# Patient Record
Sex: Female | Born: 1935 | Race: White | Hispanic: No | Smoking: Former smoker
Health system: Southern US, Community
[De-identification: ages and names within clinical notes are randomized; demographics above are authoritative.]

## PROBLEM LIST (undated history)

## (undated) DIAGNOSIS — I495 Sick sinus syndrome: Secondary | ICD-10-CM

## (undated) DIAGNOSIS — E559 Vitamin D deficiency, unspecified: Secondary | ICD-10-CM

## (undated) DIAGNOSIS — S22089A Unspecified fracture of T11-T12 vertebra, initial encounter for closed fracture: Secondary | ICD-10-CM

## (undated) DIAGNOSIS — Z8719 Personal history of other diseases of the digestive system: Secondary | ICD-10-CM

## (undated) DIAGNOSIS — N182 Chronic kidney disease, stage 2 (mild): Secondary | ICD-10-CM

## (undated) DIAGNOSIS — D500 Iron deficiency anemia secondary to blood loss (chronic): Secondary | ICD-10-CM

## (undated) DIAGNOSIS — I10 Essential (primary) hypertension: Secondary | ICD-10-CM

## (undated) DIAGNOSIS — M1990 Unspecified osteoarthritis, unspecified site: Secondary | ICD-10-CM

## (undated) DIAGNOSIS — I4891 Unspecified atrial fibrillation: Secondary | ICD-10-CM

## (undated) DIAGNOSIS — K5731 Diverticulosis of large intestine without perforation or abscess with bleeding: Principal | ICD-10-CM

## (undated) DIAGNOSIS — E039 Hypothyroidism, unspecified: Secondary | ICD-10-CM

## (undated) DIAGNOSIS — C44301 Unspecified malignant neoplasm of skin of nose: Secondary | ICD-10-CM

## (undated) DIAGNOSIS — E1129 Type 2 diabetes mellitus with other diabetic kidney complication: Secondary | ICD-10-CM

## (undated) DIAGNOSIS — E785 Hyperlipidemia, unspecified: Secondary | ICD-10-CM

## (undated) DIAGNOSIS — T7840XA Allergy, unspecified, initial encounter: Secondary | ICD-10-CM

## (undated) DIAGNOSIS — E782 Mixed hyperlipidemia: Secondary | ICD-10-CM

## (undated) DIAGNOSIS — I5189 Other ill-defined heart diseases: Secondary | ICD-10-CM

## (undated) DIAGNOSIS — E871 Hypo-osmolality and hyponatremia: Secondary | ICD-10-CM

## (undated) DIAGNOSIS — K219 Gastro-esophageal reflux disease without esophagitis: Secondary | ICD-10-CM

## (undated) HISTORY — DX: Unspecified osteoarthritis, unspecified site: M19.90

## (undated) HISTORY — DX: Sick sinus syndrome: I49.5

## (undated) HISTORY — PX: VAGINAL HYSTERECTOMY: SUR661

## (undated) HISTORY — DX: Personal history of other diseases of the digestive system: Z87.19

## (undated) HISTORY — PX: OTHER SURGICAL HISTORY: SHX169

## (undated) HISTORY — PX: NISSEN FUNDOPLICATION: SHX2091

## (undated) HISTORY — DX: Unspecified fracture of t11-T12 vertebra, initial encounter for closed fracture: S22.089A

## (undated) HISTORY — DX: Type 2 diabetes mellitus with other diabetic kidney complication: E11.29

## (undated) HISTORY — DX: Hypothyroidism, unspecified: E03.9

## (undated) HISTORY — PX: KYPHOSIS SURGERY: SHX114

## (undated) HISTORY — PX: JOINT REPLACEMENT: SHX530

## (undated) HISTORY — PX: TONSILLECTOMY: SUR1361

## (undated) HISTORY — DX: Hyperlipidemia, unspecified: E78.5

## (undated) HISTORY — DX: Chronic kidney disease, stage 2 (mild): N18.2

## (undated) HISTORY — DX: Mixed hyperlipidemia: E78.2

## (undated) HISTORY — DX: Unspecified atrial fibrillation: I48.91

## (undated) HISTORY — DX: Vitamin D deficiency, unspecified: E55.9

## (undated) HISTORY — DX: Iron deficiency anemia secondary to blood loss (chronic): D50.0

## (undated) HISTORY — PX: HIATAL HERNIA REPAIR: SHX195

## (undated) HISTORY — DX: Unspecified malignant neoplasm of skin of nose: C44.301

## (undated) HISTORY — DX: Gastro-esophageal reflux disease without esophagitis: K21.9

## (undated) HISTORY — PX: EYE SURGERY: SHX253

## (undated) HISTORY — DX: Allergy, unspecified, initial encounter: T78.40XA

## (undated) HISTORY — PX: CYSTOCELE REPAIR: SHX163

---

## 1999-04-01 ENCOUNTER — Encounter: Payer: Self-pay | Admitting: Emergency Medicine

## 1999-04-01 ENCOUNTER — Emergency Department (HOSPITAL_COMMUNITY): Admission: EM | Admit: 1999-04-01 | Discharge: 1999-04-01 | Payer: Self-pay | Admitting: Emergency Medicine

## 2000-10-15 ENCOUNTER — Encounter: Admission: RE | Admit: 2000-10-15 | Discharge: 2000-10-15 | Payer: Self-pay | Admitting: Family Medicine

## 2000-10-15 ENCOUNTER — Encounter: Payer: Self-pay | Admitting: Family Medicine

## 2001-03-06 ENCOUNTER — Ambulatory Visit (HOSPITAL_COMMUNITY): Admission: RE | Admit: 2001-03-06 | Discharge: 2001-03-06 | Payer: Self-pay | Admitting: Gastroenterology

## 2001-03-19 ENCOUNTER — Encounter: Admission: RE | Admit: 2001-03-19 | Discharge: 2001-03-19 | Payer: Self-pay | Admitting: Gastroenterology

## 2001-03-19 ENCOUNTER — Encounter: Payer: Self-pay | Admitting: Gastroenterology

## 2001-05-05 ENCOUNTER — Inpatient Hospital Stay (HOSPITAL_COMMUNITY): Admission: RE | Admit: 2001-05-05 | Discharge: 2001-05-08 | Payer: Self-pay | Admitting: Surgery

## 2001-05-06 ENCOUNTER — Encounter: Payer: Self-pay | Admitting: Surgery

## 2001-08-20 ENCOUNTER — Other Ambulatory Visit: Admission: RE | Admit: 2001-08-20 | Discharge: 2001-08-20 | Payer: Self-pay | Admitting: Obstetrics and Gynecology

## 2001-12-23 ENCOUNTER — Encounter: Admission: RE | Admit: 2001-12-23 | Discharge: 2001-12-23 | Payer: Self-pay | Admitting: Family Medicine

## 2001-12-23 ENCOUNTER — Encounter: Payer: Self-pay | Admitting: Family Medicine

## 2002-09-10 ENCOUNTER — Other Ambulatory Visit: Admission: RE | Admit: 2002-09-10 | Discharge: 2002-09-10 | Payer: Self-pay | Admitting: Obstetrics and Gynecology

## 2002-09-17 ENCOUNTER — Ambulatory Visit (HOSPITAL_COMMUNITY): Admission: RE | Admit: 2002-09-17 | Discharge: 2002-09-17 | Payer: Self-pay | Admitting: Obstetrics and Gynecology

## 2002-09-17 ENCOUNTER — Encounter: Payer: Self-pay | Admitting: Obstetrics and Gynecology

## 2002-12-10 ENCOUNTER — Ambulatory Visit (HOSPITAL_COMMUNITY): Admission: RE | Admit: 2002-12-10 | Discharge: 2002-12-10 | Payer: Self-pay | Admitting: Gastroenterology

## 2003-09-20 ENCOUNTER — Other Ambulatory Visit: Admission: RE | Admit: 2003-09-20 | Discharge: 2003-09-20 | Payer: Self-pay | Admitting: Obstetrics and Gynecology

## 2004-07-11 ENCOUNTER — Encounter: Admission: RE | Admit: 2004-07-11 | Discharge: 2004-07-11 | Payer: Self-pay | Admitting: Family Medicine

## 2004-08-18 ENCOUNTER — Ambulatory Visit (HOSPITAL_COMMUNITY): Admission: RE | Admit: 2004-08-18 | Discharge: 2004-08-18 | Payer: Self-pay | Admitting: Obstetrics and Gynecology

## 2004-10-04 ENCOUNTER — Encounter: Admission: RE | Admit: 2004-10-04 | Discharge: 2004-10-04 | Payer: Self-pay | Admitting: Obstetrics and Gynecology

## 2004-12-20 ENCOUNTER — Encounter: Admission: RE | Admit: 2004-12-20 | Discharge: 2004-12-20 | Payer: Self-pay | Admitting: Surgery

## 2005-02-26 ENCOUNTER — Ambulatory Visit (HOSPITAL_COMMUNITY): Admission: RE | Admit: 2005-02-26 | Discharge: 2005-02-26 | Payer: Self-pay | Admitting: Ophthalmology

## 2005-11-20 ENCOUNTER — Encounter (INDEPENDENT_AMBULATORY_CARE_PROVIDER_SITE_OTHER): Payer: Self-pay | Admitting: *Deleted

## 2005-11-20 ENCOUNTER — Ambulatory Visit (HOSPITAL_COMMUNITY): Admission: RE | Admit: 2005-11-20 | Discharge: 2005-11-20 | Payer: Self-pay | Admitting: Gastroenterology

## 2005-12-19 ENCOUNTER — Other Ambulatory Visit: Admission: RE | Admit: 2005-12-19 | Discharge: 2005-12-19 | Payer: Self-pay | Admitting: Obstetrics and Gynecology

## 2006-07-11 ENCOUNTER — Encounter (HOSPITAL_COMMUNITY): Admission: RE | Admit: 2006-07-11 | Discharge: 2006-10-09 | Payer: Self-pay | Admitting: Endocrinology

## 2006-10-11 ENCOUNTER — Encounter (HOSPITAL_COMMUNITY): Admission: RE | Admit: 2006-10-11 | Discharge: 2006-10-11 | Payer: Self-pay | Admitting: Endocrinology

## 2006-10-18 ENCOUNTER — Encounter: Admission: RE | Admit: 2006-10-18 | Discharge: 2006-10-18 | Payer: Self-pay | Admitting: Family Medicine

## 2006-11-27 ENCOUNTER — Other Ambulatory Visit: Admission: RE | Admit: 2006-11-27 | Discharge: 2006-11-27 | Payer: Self-pay | Admitting: Obstetrics and Gynecology

## 2007-12-22 ENCOUNTER — Inpatient Hospital Stay (HOSPITAL_COMMUNITY): Admission: EM | Admit: 2007-12-22 | Discharge: 2007-12-24 | Payer: Self-pay | Admitting: Emergency Medicine

## 2008-01-28 ENCOUNTER — Encounter (INDEPENDENT_AMBULATORY_CARE_PROVIDER_SITE_OTHER): Payer: Self-pay | Admitting: Interventional Cardiology

## 2008-01-28 ENCOUNTER — Ambulatory Visit (HOSPITAL_COMMUNITY): Admission: RE | Admit: 2008-01-28 | Discharge: 2008-01-28 | Payer: Self-pay | Admitting: Interventional Cardiology

## 2008-03-27 ENCOUNTER — Inpatient Hospital Stay (HOSPITAL_COMMUNITY): Admission: AD | Admit: 2008-03-27 | Discharge: 2008-03-30 | Payer: Self-pay | Admitting: Interventional Cardiology

## 2008-03-29 HISTORY — PX: PACEMAKER INSERTION: SHX728

## 2008-06-03 ENCOUNTER — Encounter: Admission: RE | Admit: 2008-06-03 | Discharge: 2008-06-03 | Payer: Self-pay | Admitting: Family Medicine

## 2008-11-12 DIAGNOSIS — S22089A Unspecified fracture of T11-T12 vertebra, initial encounter for closed fracture: Secondary | ICD-10-CM

## 2008-11-12 HISTORY — DX: Unspecified fracture of t11-T12 vertebra, initial encounter for closed fracture: S22.089A

## 2008-12-23 ENCOUNTER — Other Ambulatory Visit: Admission: RE | Admit: 2008-12-23 | Discharge: 2008-12-23 | Payer: Self-pay | Admitting: Obstetrics and Gynecology

## 2009-03-02 ENCOUNTER — Encounter: Admission: RE | Admit: 2009-03-02 | Discharge: 2009-03-02 | Payer: Self-pay | Admitting: Family Medicine

## 2009-05-05 ENCOUNTER — Inpatient Hospital Stay (HOSPITAL_COMMUNITY): Admission: EM | Admit: 2009-05-05 | Discharge: 2009-05-11 | Payer: Self-pay | Admitting: Emergency Medicine

## 2009-05-10 ENCOUNTER — Encounter (INDEPENDENT_AMBULATORY_CARE_PROVIDER_SITE_OTHER): Payer: Self-pay | Admitting: Interventional Radiology

## 2009-05-23 ENCOUNTER — Encounter: Payer: Self-pay | Admitting: Interventional Radiology

## 2009-06-08 ENCOUNTER — Encounter: Payer: Self-pay | Admitting: Family Medicine

## 2009-06-09 ENCOUNTER — Encounter: Admission: RE | Admit: 2009-06-09 | Discharge: 2009-06-09 | Payer: Self-pay | Admitting: Family Medicine

## 2009-06-21 ENCOUNTER — Ambulatory Visit (HOSPITAL_COMMUNITY): Admission: RE | Admit: 2009-06-21 | Discharge: 2009-06-21 | Payer: Self-pay | Admitting: Interventional Radiology

## 2009-07-05 ENCOUNTER — Encounter: Payer: Self-pay | Admitting: Interventional Radiology

## 2010-05-21 ENCOUNTER — Inpatient Hospital Stay (HOSPITAL_COMMUNITY): Admission: EM | Admit: 2010-05-21 | Discharge: 2010-05-24 | Payer: Self-pay | Admitting: Emergency Medicine

## 2010-09-12 ENCOUNTER — Encounter: Admission: RE | Admit: 2010-09-12 | Discharge: 2010-09-12 | Payer: Self-pay | Admitting: Orthopedic Surgery

## 2010-12-02 ENCOUNTER — Encounter: Payer: Self-pay | Admitting: Obstetrics and Gynecology

## 2010-12-04 ENCOUNTER — Encounter: Payer: Self-pay | Admitting: Internal Medicine

## 2011-01-28 LAB — POCT I-STAT, CHEM 8
Calcium, Ion: 1.16 mmol/L (ref 1.12–1.32)
Glucose, Bld: 102 mg/dL — ABNORMAL HIGH (ref 70–99)
HCT: 45 % (ref 36.0–46.0)
Hemoglobin: 15.3 g/dL — ABNORMAL HIGH (ref 12.0–15.0)
Potassium: 4.1 mEq/L (ref 3.5–5.1)

## 2011-01-28 LAB — PROTIME-INR
INR: 2.22 — ABNORMAL HIGH (ref 0.00–1.49)
Prothrombin Time: 14.5 seconds (ref 11.6–15.2)

## 2011-01-28 LAB — TYPE AND SCREEN
ABO/RH(D): B POS
Antibody Screen: NEGATIVE

## 2011-01-28 LAB — URINALYSIS, ROUTINE W REFLEX MICROSCOPIC
Bilirubin Urine: NEGATIVE
Ketones, ur: NEGATIVE mg/dL
Nitrite: NEGATIVE
Specific Gravity, Urine: 1.006 (ref 1.005–1.030)
Urobilinogen, UA: 0.2 mg/dL (ref 0.0–1.0)

## 2011-01-28 LAB — CBC
HCT: 40.6 % (ref 36.0–46.0)
Hemoglobin: 11.9 g/dL — ABNORMAL LOW (ref 12.0–15.0)
Hemoglobin: 12.2 g/dL (ref 12.0–15.0)
Hemoglobin: 13.9 g/dL (ref 12.0–15.0)
MCH: 31.1 pg (ref 26.0–34.0)
MCHC: 34.3 g/dL (ref 30.0–36.0)
MCV: 92.5 fL (ref 78.0–100.0)
MCV: 93 fL (ref 78.0–100.0)
MCV: 93.2 fL (ref 78.0–100.0)
Platelets: 202 10*3/uL (ref 150–400)
Platelets: 211 10*3/uL (ref 150–400)
RBC: 3.82 MIL/uL — ABNORMAL LOW (ref 3.87–5.11)
RBC: 4.37 MIL/uL (ref 3.87–5.11)
RDW: 13.1 % (ref 11.5–15.5)
RDW: 13.4 % (ref 11.5–15.5)
RDW: 13.8 % (ref 11.5–15.5)
WBC: 6.1 10*3/uL (ref 4.0–10.5)
WBC: 7.2 10*3/uL (ref 4.0–10.5)
WBC: 7.2 10*3/uL (ref 4.0–10.5)

## 2011-01-28 LAB — DIFFERENTIAL
Eosinophils Relative: 3 % (ref 0–5)
Lymphocytes Relative: 22 % (ref 12–46)
Lymphs Abs: 1.6 10*3/uL (ref 0.7–4.0)
Monocytes Absolute: 0.7 10*3/uL (ref 0.1–1.0)
Monocytes Relative: 9 % (ref 3–12)

## 2011-01-28 LAB — COMPREHENSIVE METABOLIC PANEL
ALT: 15 U/L (ref 0–35)
AST: 21 U/L (ref 0–37)
Albumin: 3.7 g/dL (ref 3.5–5.2)
Albumin: 3.9 g/dL (ref 3.5–5.2)
Alkaline Phosphatase: 50 U/L (ref 39–117)
BUN: 10 mg/dL (ref 6–23)
BUN: 9 mg/dL (ref 6–23)
Calcium: 8.5 mg/dL (ref 8.4–10.5)
Calcium: 8.9 mg/dL (ref 8.4–10.5)
Creatinine, Ser: 0.63 mg/dL (ref 0.4–1.2)
Creatinine, Ser: 0.66 mg/dL (ref 0.4–1.2)
GFR calc Af Amer: >60 mL/min (ref >60)
Potassium: 3.7 mEq/L (ref 3.5–5.1)
Sodium: 134 mEq/L — ABNORMAL LOW (ref 135–145)
Total Bilirubin: 0.6 mg/dL (ref 0.3–1.2)
Total Protein: 6.4 g/dL (ref 6.0–8.3)
Total Protein: 6.6 g/dL (ref 6.0–8.3)
Total Protein: 6.7 g/dL (ref 6.0–8.3)

## 2011-01-28 LAB — CARDIAC PANEL(CRET KIN+CKTOT+MB+TROPI)
CK, MB: 1.2 ng/mL (ref 0.3–4.0)
CK, MB: 1.4 ng/mL (ref 0.3–4.0)
Relative Index: INVALID (ref 0.0–2.5)
Relative Index: INVALID (ref 0.0–2.5)
Total CK: 43 U/L (ref 7–177)
Troponin I: <0.01 ng/mL (ref 0.00–0.06)
Troponin I: <0.01 ng/mL (ref 0.00–0.06)

## 2011-01-28 LAB — URINE CULTURE

## 2011-01-28 LAB — CK TOTAL AND CKMB (NOT AT ARMC)
CK, MB: 1.2 ng/mL (ref 0.3–4.0)
Relative Index: INVALID (ref 0.0–2.5)
Total CK: 54 U/L (ref 7–177)

## 2011-02-17 LAB — BASIC METABOLIC PANEL
CO2: 28 mEq/L (ref 19–32)
Calcium: 10 mg/dL (ref 8.4–10.5)
GFR calc Af Amer: >60 mL/min (ref >60)
GFR calc non Af Amer: >60 mL/min (ref >60)
Glucose, Bld: 136 mg/dL — ABNORMAL HIGH (ref 70–99)
Potassium: 3.7 mEq/L (ref 3.5–5.1)
Sodium: 135 mEq/L (ref 135–145)

## 2011-02-17 LAB — CBC
HCT: 41.4 % (ref 36.0–46.0)
Hemoglobin: 14 g/dL (ref 12.0–15.0)
RBC: 4.42 MIL/uL (ref 3.87–5.11)
RDW: 12.8 % (ref 11.5–15.5)

## 2011-02-17 LAB — APTT: aPTT: 30 seconds (ref 24–37)

## 2011-02-17 LAB — PROTIME-INR: INR: 1 (ref 0.00–1.49)

## 2011-02-19 LAB — BASIC METABOLIC PANEL
BUN: 9 mg/dL (ref 6–23)
CO2: 31 mEq/L (ref 19–32)
Chloride: 98 mEq/L (ref 96–112)
Chloride: 99 mEq/L (ref 96–112)
GFR calc non Af Amer: >60 mL/min (ref >60)
Glucose, Bld: 115 mg/dL — ABNORMAL HIGH (ref 70–99)
Potassium: 3.4 mEq/L — ABNORMAL LOW (ref 3.5–5.1)
Potassium: 3.7 mEq/L (ref 3.5–5.1)
Sodium: 134 mEq/L — ABNORMAL LOW (ref 135–145)
Sodium: 135 mEq/L (ref 135–145)

## 2011-02-19 LAB — CBC
HCT: 41.1 % (ref 36.0–46.0)
Hemoglobin: 13.2 g/dL (ref 12.0–15.0)
Hemoglobin: 13.9 g/dL (ref 12.0–15.0)
MCHC: 34.6 g/dL (ref 30.0–36.0)
MCV: 94 fL (ref 78.0–100.0)
MCV: 94 fL (ref 78.0–100.0)
Platelets: 338 10*3/uL (ref 150–400)
RBC: 4.04 MIL/uL (ref 3.87–5.11)
RBC: 4.38 MIL/uL (ref 3.87–5.11)
WBC: 8.8 10*3/uL (ref 4.0–10.5)

## 2011-02-19 LAB — URINALYSIS, ROUTINE W REFLEX MICROSCOPIC
Bilirubin Urine: NEGATIVE
Leukocytes, UA: NEGATIVE
Nitrite: NEGATIVE
Nitrite: POSITIVE — AB
Protein, ur: 100 mg/dL — AB
Specific Gravity, Urine: 1.007 (ref 1.005–1.030)
Specific Gravity, Urine: 1.03 (ref 1.005–1.030)
Urobilinogen, UA: 0.2 mg/dL (ref 0.0–1.0)
Urobilinogen, UA: 1 mg/dL (ref 0.0–1.0)
pH: 6.5 (ref 5.0–8.0)

## 2011-02-19 LAB — GLUCOSE, CAPILLARY
Glucose-Capillary: 133 mg/dL — ABNORMAL HIGH (ref 70–99)
Glucose-Capillary: 169 mg/dL — ABNORMAL HIGH (ref 70–99)

## 2011-02-19 LAB — URINE CULTURE

## 2011-02-19 LAB — DIFFERENTIAL
Eosinophils Absolute: 0 10*3/uL (ref 0.0–0.7)
Eosinophils Absolute: 0.4 10*3/uL (ref 0.0–0.7)
Eosinophils Relative: 4 % (ref 0–5)
Lymphocytes Relative: 27 % (ref 12–46)
Lymphs Abs: 1.1 10*3/uL (ref 0.7–4.0)
Lymphs Abs: 2.4 10*3/uL (ref 0.7–4.0)
Monocytes Absolute: 0.8 10*3/uL (ref 0.1–1.0)
Monocytes Relative: 9 % (ref 3–12)
Neutrophils Relative %: 78 % — ABNORMAL HIGH (ref 43–77)

## 2011-02-19 LAB — COMPREHENSIVE METABOLIC PANEL
ALT: 19 U/L (ref 0–35)
Alkaline Phosphatase: 64 U/L (ref 39–117)
BUN: 9 mg/dL (ref 6–23)
CO2: 29 mEq/L (ref 19–32)
CO2: 32 mEq/L (ref 19–32)
Calcium: 9.4 mg/dL (ref 8.4–10.5)
Chloride: 90 mEq/L — ABNORMAL LOW (ref 96–112)
Creatinine, Ser: 0.75 mg/dL (ref 0.4–1.2)
GFR calc non Af Amer: >60 mL/min (ref >60)
GFR calc non Af Amer: >60 mL/min (ref >60)
Glucose, Bld: 152 mg/dL — ABNORMAL HIGH (ref 70–99)
Glucose, Bld: 220 mg/dL — ABNORMAL HIGH (ref 70–99)
Potassium: 3.2 mEq/L — ABNORMAL LOW (ref 3.5–5.1)
Sodium: 136 mEq/L (ref 135–145)
Total Bilirubin: 0.7 mg/dL (ref 0.3–1.2)
Total Protein: 7.1 g/dL (ref 6.0–8.3)

## 2011-02-19 LAB — PROTIME-INR
INR: 1.1 (ref 0.00–1.49)
INR: 1.6 — ABNORMAL HIGH (ref 0.00–1.49)
INR: 2.3 — ABNORMAL HIGH (ref 0.00–1.49)
INR: 2.4 — ABNORMAL HIGH (ref 0.00–1.49)
Prothrombin Time: 14.2 seconds (ref 11.6–15.2)
Prothrombin Time: 19.9 seconds — ABNORMAL HIGH (ref 11.6–15.2)
Prothrombin Time: 27.6 seconds — ABNORMAL HIGH (ref 11.6–15.2)
Prothrombin Time: 31.2 seconds — ABNORMAL HIGH (ref 11.6–15.2)

## 2011-02-19 LAB — URINE MICROSCOPIC-ADD ON

## 2011-02-19 LAB — LIPASE, BLOOD: Lipase: 14 U/L (ref 11–59)

## 2011-03-27 NOTE — Discharge Summary (Signed)
NAMEBAILEIGH, [name]               ACCOUNT NO.:  [id number]   MEDICAL RECORD NO.:  [id number]          PATIENT TYPE:  INP   LOCATION:  2923                         FACILITY:  [facility]   PHYSICIAN:  Corky Crafts, MDDATE OF BIRTH:  23-Oct-1926   DATE OF ADMISSION:  03/27/2008  DATE OF DISCHARGE:  03/30/2008                               DISCHARGE SUMMARY   DISCHARGE DIAGNOSES:  1. Sick sinus syndrome status post St. Jude pacemaker.  2. History of atrial fibrillation.  3. Diastolic heart failure, resolved.  4. Hypertension.  5. Hyperlipidemia.  6. Seasonal allergies.  7. Allergy to CODEINE and SULFA.  8. Past surgical history includes partial hysterectomy, tonsillectomy,      and bladder surgery.  9. Family history of coronary artery disease.  10.Long-term medication use.  11.Long-term anticoagulation therapy.   [name] is a 77 year old female with a history of atrial  fibrillation who was recently admitted for RVR secondary to heart  failure.  She is wearing a LifeWatch monitor and the monitor noted a 4-  second pause which was consistent with sick sinus syndrome and she was  promptly admitted to [facility] and placed on telemetry.   She ultimately received a dual chamber St. [employer] pacemaker under  the care of Dr. Corliss Marcus.  Postoperatively, the patient did well.  Her chest x-ray was okay with no pneumothorax and the patient was  discharged to home the following day post procedure.   A activity and wound care instruction sheet was provided for the  patient.  The patient is to remain on a low-sodium heart-healthy diet.  The patient is to follow up with [facility] on Apr 02, 2008 at 12  p.m. and follow up with Dr. Eldridge Dace on April 13, 2008 at 10:45 a.m.   MEDICATIONS:  1. Cardizem CD 360 mg a day.  2. Toprol XL 50 mg a day.  3. Coumadin 5 mg a day.  4. Zocor 20 mg at bedtime.  5. Lasix 40 mg a day.  6. Micardis 80 mg a day.  7. Amiodarone 200 mg a  day.  8. Vicodin 5/325 one p.o. q.6 h. p.r.n. pain, #20, no refills.  9. Ambien 5 mg one p.o. at bedtime p.r.n. sleep, #10, no refills.   The patient was discharged to home in stable and improved condition.      Guy Franco, P.A.      Corky Crafts, MD  Electronically Signed    LB/MEDQ  D:  03/30/2008  T:  03/31/2008  Job:  [id number]

## 2011-03-27 NOTE — Consult Note (Signed)
[name], [name]               ACCOUNT NO.:  [id number]   MEDICAL RECORD NO.:  [id number]          PATIENT TYPE:  OUT   LOCATION:  XRAY                         FACILITY:  [facility]   PHYSICIAN:  Sanjeev K. Deveshwar, M.D.DATE OF BIRTH:  [birth year]   DATE OF CONSULTATION:  07/05/2009  DATE OF DISCHARGE:  07/05/2009                                 CONSULTATION   CHIEF COMPLAINT:  Status post vertebral augmentation at T11 performed on  June 21, 2009.   BRIEF HISTORY:  This is a pleasant 77 year old female who was admitted  to [facility] on May 05, 2009 by Dr. Crista Curb for  evaluation of severe back pain, which occurred after she fell in her  yard.  She was found to have a T11 compression fracture with a  paraspinous hematoma.  She underwent a kyphoplasty procedure on May 10, 2009, performed by Dr. Corliss Skains.  She was seen in followup on May 23, 2009.  At that time, she was still having pain.  The patient is unable  have an MRI due to a permanent pacemaker.  A bone scan was performed,  however, this did not show any new fracture and Dr. Corliss Skains offered to  add more cement at the T11 level to see if this would improve her  symptoms of pain.  The additional cement was added on June 21, 2009.  The patient returns today accompanied by her son to be seen in followup  approximately 2 weeks after her most recent intervention.   PAST MEDICAL HISTORY:  Significant for hypertension, hyperlipidemia,  paroxysmal atrial fibrillation with chronic Coumadin therapy.  She has a  Environmental education officer pacemaker for sick sinus syndrome.  She has a history  of congestive heart failure with diastolic dysfunction.   SURGICAL HISTORY:  Significant for tonsillectomy, hernia repair, partial  hysterectomy, and bladder surgery.  She denies any previous problems  with anesthesia.   ALLERGIES:  She is allergic to CODEINE and SULFA.   MEDICATIONS:  The patient states that her medications  have not changed.  She has been on Cardizem, Toprol, Coumadin, Zocor, Lasix, Micardis, and  amiodarone.  We had given the patient a prescription for Vicodin at her  last visit, #20 to be taken one tablet every 4-6 hours as needed for  pain.  The patient states that she has been taking the Vicodin twice  daily, but she has since run out.   SOCIAL HISTORY:  The patient's son lives with her.  She has been fairly  independent.  She does not smoke or use alcohol.  She is widowed.   IMPRESSION AND PLAN:  The patient returns today accompanied by her son.  She had undergone a kyphoplasty at the T11 level on May 10, 2009,  performed by Dr. Corliss Skains.  She had continued pain and underwent  additional vertebral augmentation on June 21, 2009 at the T11 level.  Of note, a biopsy from May 10, 2009 procedure was negative for  malignancy.   The patient reports that she is still having some pain.  She rates her  pain  now as a 6 on a 1 to 10 level prior to the most recent treatment.  Her pain was 8/10 on a 1 to 10 scale.  She states that her pain is worse  in the morning, it is also worse with activities.  She has continued to  limit her activity due to her discomfort.  She was unable to cook, do  dishes, or clean house at this time because of the ongoing pain.  She  has run out of her Vicodin.  She requested another prescription for  Vicodin.  Dr. Corliss Skains cautioned her that this medication is habit  forming, but she states she is only using it twice a day and feels that  she needs at least one more prescription to get her through.   We did give the patient a prescription for Vicodin 5/325 to be taken one  every 4-6 hours, dispensed #30 with no refills.  The patient's son  stated that she did not get any relief with Darvocet, Tylenol, or  nonsteroidal anti-inflammatory drugs.  She is back on her Coumadin at  this time.   Dr. Corliss Skains did review the images from the two procedures with the   patient and her son.  He felt that there was a very good result after  the most recent augmentation.  He felt that her pain should continue to  improve over the next couple of weeks.  She was told to contact us in 2  weeks to update [place] on her situation.  All of their questions were  answered.  Greater than 25 minutes was spent on this followup visit.      Delton See, P.A.    ______________________________  Grandville Silos. Corliss Skains, M.D.    DR/MEDQ  D:  07/05/2009  T:  07/06/2009  Job:  [id number]   cc:   Donia Guiles, M.D.

## 2011-03-27 NOTE — Op Note (Signed)
[name], [name]               ACCOUNT NO.:  [id number]   MEDICAL RECORD NO.:  [id number]          PATIENT TYPE:  OIB   LOCATION:  2899                         FACILITY:  [facility]   PHYSICIAN:  Corky Crafts, MDDATE OF BIRTH:  08/12/1926   DATE OF PROCEDURE:  01/28/2008  DATE OF DISCHARGE:                               OPERATIVE REPORT   REFERRING PHYSICIAN:  Donia Guiles, M.D.   INDICATIONS FOR PROCEDURE:  Atrial fibrillation.   PROCEDURE PERFORMED:  TEE cardioversion.   DESCRIPTION OF PROCEDURE:  The risks and benefits of TEE cardioversion  were explained to the patient and informed consent was obtained.  The  patient was brought to the endoscopy lab.  The TEE showed no  intracardiac thrombus.  Defibrillation pads were placed on her anterior  chest wall and back and a 100 joule single biphasic shock was  administered with successful restoration of normal sinus rhythm.  Sodium  pentothal 100 mg had been administered by Dr. Jacklynn Bue for anesthesia.  The procedure was tolerated well.   RECOMMENDATIONS:  The patient will stay on Coumadin for at least 30 days  if not longer.  I will follow her back up with her in the office.      Corky Crafts, MD  Electronically Signed     JSV/MEDQ  D:  01/28/2008  T:  01/28/2008  Job:  [id number]

## 2011-03-27 NOTE — H&P (Signed)
[name], [name]               ACCOUNT NO.:  [id number]   MEDICAL RECORD NO.:  [id number]          PATIENT TYPE:  INP   LOCATION:  2007                         [facility]AC[facility]  [facility]   PHYSICIAN:  Corky Crafts, MDDATE OF BIRTH:  1926-01-07   DATE OF ADMISSION:  12/22/2007  DATE OF DISCHARGE:                              HISTORY & PHYSICAL   REFERRING:  Dr. Blair Heys and Dr. Donia Guiles.   REASON FOR ADMISSION:  1. Atrial fibrillation.  2. Congestive heart failure.  3. Hypertension.  4. High cholesterol.   HISTORY OF PRESENT ILLNESS:  The patient is a 77 year old woman who had  been feeling tired.  She had been experiencing shortness of breath and  dyspnea on exertion.  She had also noticed her heart racing.  She saw  her primary care doctor today.  They did an EKG, and she was found to be  in atrial fibrillation with rapid ventricular response.  Her shortness  of breath has been worsening.  She now finds it hard to complete  sentences.  She has also had lower extremity edema and also feels some  tingling in the left side of her chest.   PAST MEDICAL HISTORY:  1. Hypertension.  2. High cholesterol.  3. Seasonal allergies.   PAST SURGICAL HISTORY:  Partial hysterectomy, stomach surgery, bladder  surgery.   ALLERGIES:  She is intolerant of codeine.  She is allergic to sulfa.   CURRENT MEDICATIONS:  1. Clonidine 0.1 mg daily.  2. Toprol XL 100 mg.  3. Micardis 80 mg day.  4. Zocor 20 mg day.  5. Calcium.  6. Vitamin D.  7. Allegra.   SOCIAL HISTORY:  She lives with her son.  She does not smoke.  She quit  36 years ago.  She does not drink alcohol.  She does drink caffeine  regularly.   FAMILY HISTORY:  Father died of an MI at age 41.  Sister died of an MI  at age 45.   REVIEW OF SYSTEMS:  Significant for shortness of breath.  She has  tingling around her left chest.  No bleeding problems.  No headaches, no  fevers, chills, no focal weakness, no nausea,  vomiting.  She does have  palpitations.  She does have shortness of breath along with orthopnea.  All other systems negative.   PHYSICAL EXAMINATION:  VITAL SIGNS:  Blood pressure 160/80, heart rate  around 130, respiratory rate 22.  GENERAL:  She is awake and alert,  her breathing is mildly labored.  HEENT:  Head normocephalic, atraumatic.  Eyes: Extraocular is intact.  NECK:  No bruits.  CARDIOVASCULAR:  She is tachycardiac.  Rhythm is irregularly irregular.  LUNGS:  Exam shows bibasilar crackles.  ABDOMEN:  Soft, nontender.  EXTREMITIES:  Show 1-2+ edema which is pitting bilaterally.  NEUROLOGICAL: No focal deficits.  SKIN:  No rash.  BACK:  Normal kyphosis.  PSYCHIATRIC:  Normal mood and affect.   STUDIES:  ECG shows atrial fibrillation with rapid ventricular response  and a right bundle branch block, old labs from October 22, 2007 showed  creatinine  0.6.   ASSESSMENT/PLAN:  A 77 year old with new onset congestive heart failure  secondary to diastolic dysfunction from atrial fibrillation with rapid  ventricular response.  Will start IV Lasix.  1. Continue beta blocker.  Will add IV Cardizem.  If her blood      pressure will tolerate it, will continue her home dose of Micardis.  2. Start IV heparin.  She may eventually need Coumadin given her      atrial fibrillation.  3. Will admit to telemetry and also rule out for MI with enzymes.  4. Continue Zocor for high cholesterol.  5. I will follow the patient while she is in the hospital.      Corky Crafts, MD  Electronically Signed     JSV/MEDQ  D:  12/22/2007  T:  12/23/2007  Job:  [id number]

## 2011-03-27 NOTE — Discharge Summary (Signed)
[name]               ACCOUNT NO.:  [id number]   MEDICAL RECORD NO.:  [id number]          PATIENT TYPE:  INP   LOCATION:  2007                         FACILITY:  [facility]   PHYSICIAN:  Corky Crafts, MDDATE OF BIRTH:  03/31/1926   DATE OF ADMISSION:  12/22/2007  DATE OF DISCHARGE:  12/24/2007                               DISCHARGE SUMMARY   DISCHARGE DIAGNOSES:  1. Atrial fibrillation, rate controlled.  2. Congestive heart failure, mild, resolved.  3. Hypertension.  4. Hyperlipidemia.  5. INTOLERANT TO CODEINE.  6. ALLERGY TO SULFA.   HOSPITAL COURSE:  [name] is a 77 year old female, who complains  of fatigue and shortness of breath as well as palpitations.  She saw her  primary care doctor on the day of admission and was found to be in  atrial fibrillation with RVR.  She was then admitted to [facility].   Other lab work includes a hemoglobin of 14.2, hematocrit 42.2, platelets  269, and sodium 134, potassium 3.5, BUN 6, creatinine of 0.78, LFTs  normal, cardiac enzymes negative, TSH 3.244.   She remained in atrial fibrillation, but we did rate control on her with  Cardizem and a beta blocker.  We discharged her on Coumadin for  anticoagulation.  She remained in the hospital for 48 hours, and we felt  at that time she was ready for discharge to home.  She would need an  outpatient echocardiogram in the office.  We will plan for her to be on  anticoagulation therapy for several weeks, and once this has been  therapeutic we will plan for a cardioversion as an outpatient.   DISCHARGE MEDICATIONS:  1. Cardizem-CD 360 mg a day.  2. Toprol-XL 100 mg a day.  3. Coumadin 5 mg a day.  4. Micardis 80 mg a day.  5. Zocor 20 mg a day.  6. Lasix 40 mg a day.  7. Allegra p.r.n.  8. She is to stop her Clonidine.   Follow up with Dr. Eldridge Dace on January 05, 2008, at 2:30 p.m.  Follow  up with blood work and ultrasound of her heart on December 26, 2007, at  11 a.m.  Remain on a low sodium, heart healthy diet.  Increase activity  slowly.  Call for any questions or concerns.      Guy Franco, P.A.      Corky Crafts, MD  Electronically Signed    LB/MEDQ  D:  01/15/2008  T:  01/15/2008  Job:  [id number]   cc:   Donia Guiles, M.D.  Bryan Lemma. Manus Gunning, M.D.

## 2011-03-27 NOTE — Consult Note (Signed)
NAMEMOLLEY, HOUSER               ACCOUNT NO.:  [id number]   MEDICAL RECORD NO.:  [id number]          PATIENT TYPE:  OUT   LOCATION:  XRAY                         FACILITY:  [facility]   PHYSICIAN:  Sanjeev K. Deveshwar, M.D.DATE OF BIRTH:  [birth year]   DATE OF CONSULTATION:  06/08/2009  DATE OF DISCHARGE:  06/08/2009                                 CONSULTATION   CHIEF COMPLAINT:  Status post kyphoplasty at T11 performed on May 10, 2009.   BRIEF HISTORY:  This is a pleasant 77 year old female who had been  admitted to [facility] on May 05, 2009, by Dr. Crista Curb with severe back pain.  The patient had fallen in her yard when  she lost her balance approximately 4 days prior to admission.  She was  found to have a T11 compression fracture with a paraspinous hematoma.  On May 10, 2009, the patient underwent a kyphoplasty procedure  performed at that level.  She was seen in followup on May 23, 2009, by  Dr. Corliss Skains.  She was continuing to have pain at that time.  The  patient was given the option to have a further imaging study.  A bone  scan was recommended; however, the patient decided to wait another week  or tow to see if her pain would improve.  We were contacted by Dr.  Roselie Skinner office today.  The patient was seen in his office and was  still having severe pain.  We scheduled her for a followup visit at 3  o'clock this afternoon.  She presented with her daughter for that visit.   PAST MEDICAL HISTORY:  Significant for hypertension, hyperlipidemia,  paroxysmal atrial fibrillation, chronic Coumadin therapy.  She has a Solicitor pacemaker for sick sinus syndrome.  She has a history of  congestive heart failure with diastolic dysfunction.  She had urinary  tract infection while she was in the hospital.   SURGICAL HISTORY:  Significant for tonsillectomy, hernia repair, partial  hysterectomy, and bladder surgery.  She denies any previous problems  with  anesthesia.   ALLERGIES:  She is allergic to CODEINE and SULFA.   MEDICATIONS:  The patient's medications have not changed since her  previous visit.  They consisted of Cardizem, Toprol, Coumadin, Zocor,  Lasix, Micardis, amiodarone, and Vicodin for pain.   SOCIAL HISTORY:  The patient's son lives with her.  She has been fairly  independent.  She does not smoke or use alcohol.  She is widowed.   FAMILY HISTORY:  Noncontributory.   IMPRESSION AND PLAN:  The patient returns today for further evaluation  of her back pain.  She had undergone a T11 kyphoplasty on May 10, 2009.  She states that her pain never significantly improved.  She had been  taking Vicodin, although she has run out of the Vicodin.  The patient  reports that she had participated in physical therapy shortly after the  kyphoplasty and she feels that she may have caused some more back  problems during her therapies.   Dr. Corliss Skains examined the patient.  Her pain appears  to be at a level  above the previously treated T11 level.  Unfortunately, the patient is  unable to have an MRI due to her permanent pacemaker.  She rates her  pain as a 9 or 10 on a 1-10 scale.  As noted, she has run out of  Vicodin.  We did give her another prescription for Vicodin to be taken  one q.4-6 h. p.r.n., #30 with no refills.  We have scheduled her for a  bone scan to be done at [facility] this Friday, June 10, 2009, as  there were no appointments available at the hospitals within [place].   If the bone scan indicates a new fracture, we will schedule her for a  repeat kyphoplasty or vertebroplasty sometime next week.  She will have  to stop her Coumadin approximately 5 days before the intervention.  She  will be able to resume the Coumadin the day after the procedure.  Both  the patient and her daughter are anxious to have this issue resolved as  she is usually very active and is now very dependent and disabled due to  her pain.   Dr. Corliss Skains recommended that she not do anything strenuous  including driving.  She was told not to lift more than 10 pounds until  we have further information regarding a possible new fracture.  Once  again the patient's Coumadin will need to be held for 5 days prior to  the intervention.  Hopefully, if she does have a new fracture, we will  be able to schedule her for sometime early next week.  The biopsy from  her previous kyphoplasty showed no evidence of malignancy.   Greater than 15 minutes was spent on this followup visit.      Delton See, P.A.    ______________________________  Grandville Silos. Corliss Skains, M.D.    DR/MEDQ  D:  06/08/2009  T:  06/09/2009  Job:  [id number]   cc:   Donia Guiles, M.D.  Leonides Grills, M.D.  Corky Crafts, MD

## 2011-03-27 NOTE — Consult Note (Signed)
NAMEDAVINE, SWENEY               ACCOUNT NO.:  [id number]   MEDICAL RECORD NO.:  [id number]          PATIENT TYPE:  OUT   LOCATION:  XRAY                         FACILITY:  [facility]   PHYSICIAN:  Sanjeev K. Deveshwar, M.D.DATE OF BIRTH:  May 13, 1926   DATE OF CONSULTATION:  05/23/2009  DATE OF DISCHARGE:  05/23/2009                                 CONSULTATION   CHIEF COMPLAINT:  Compression fracture with kyphoplasty at the T11 level  performed on May 10, 2009, by Dr. Corliss Skains.   BRIEF HISTORY:  This is a very pleasant 77 year old female who was  admitted to [facility] on May 05, 2009, by Dr. Lendell Caprice with  severe back pain.  The patient had fallen in her yard when she lost her  balance approximately 4 days prior to admission.  She was found to have  a T11 compression fracture with a paraspinous hematoma.  On May 10, 2009, the patient underwent a kyphoplasty procedure performed at that  level.  She returns today accompanied by a caregiver to be seen in  followup.   PAST MEDICAL HISTORY:  Significant for:  1. Hypertension.  2. Hyperlipidemia.  3. She has a history of paroxysmal atrial fibrillation and has been on      Coumadin.  4. She has a St. Jude permanent pacemaker for sick sinus syndrome.  5. She has history of congestive heart failure with diastolic      dysfunction.  6. She had a recent urinary tract infection while on the hospital.   SURGICAL HISTORY:  Significant for:  1. Tonsillectomy.  2. Hernia repair.  3. Partial hysterectomy.  4. Bladder surgery.   She denies any previous problems with anesthesia.   ALLERGIES:  She is allergic to:  1. CODEINE.  2. SULFA.   MEDICATIONS:  The patient did not bring a medication list, although she  states she is on pretty much the same medication she was on at time of  discharge from the hospital.  This included:  1. Cardizem CD 360 mg daily.  2. Toprol-XL 50 mg daily.  3. Coumadin 5 mg daily.  4. Zocor 20 mg at  bedtime.  5. Lasix 40 mg daily.  6. Micardis 80 mg daily.  7. Amiodarone 200 mg daily.  8. She had been taking Vicodin for pain.  She took Ambien at that time      for sleep.   SOCIAL HISTORY:  The patient's son lives with her.  She has been fairly  independent.  She does not smoke or use alcohol.  She is widowed.   FAMILY HISTORY:  Noncontributory.   IMPRESSION AND PLAN:  The patient returns today accompanied by a  caregiver to be seen in followup.  She had undergone a T11 kyphoplasty  on May 10, 2009.  Unfortunately, she continues to have pain.  She  initially reported her pain as a 9 on 1-10 scale prior to the  intervention.  She states now, it is a 6 on a 1-10 scale.  She continues  to take Vicodin p.r.n. for pain.  I have given a  prescription at time of  discharge from the hospital.  We also received a call on May 18, 2009,  requesting further Vicodin.  We gave her Vicodin 5/325 to be taken 1 q.6  h. p.r.n., #30 no refills.   Initially, when interviewing the patient today, she indicated that the  pain was in the high thoracic area.  Unfortunately, this area was not  included on her CT scan.  She could not have an MRI due to her permanent  pacemaker.  Dr. Fatima Sanger concern was that there could be another  fracture in this area that did not show up on the original study.  Later, she also reported still having low back pain.  She appears to be  somewhat confused.  Her caregiver reports that she has very poor memory.   We gave the patient the option of repeating a further study at this time  or waiting another week or two to see if her pain improves on its own.  The patient would like to wait prior to further evaluation.  We have  asked the patient to call us in 1-2 weeks to update [place] on her level of  pain.  She was also told to call if she would suddenly develop a severe  change in her pain.  If she continues to have pain, we will consider a  bone scan.   Greater than 15  minutes was spent on this follow up visit.      Delton See, P.A.    ______________________________  Grandville Silos. Corliss Skains, M.D.    DR/MEDQ  D:  05/23/2009  T:  05/24/2009  Job:  [id number]   cc:   Corinna L. Lendell Caprice, MD  Leonides Grills, M.D.  Donia Guiles, M.D.  Corky Crafts, MD

## 2011-03-27 NOTE — H&P (Signed)
NAMEMarland Novak  Angelica, Novak               ACCOUNT NO.:  [id number]   MEDICAL RECORD NO.:  [id number]          PATIENT TYPE:  INP   LOCATION:  2923                         FACILITY:  [facility]   PHYSICIAN:  Jake Bathe, MD      DATE OF BIRTH:  [birth year]   DATE OF ADMISSION:  03/27/2008  DATE OF DISCHARGE:                              HISTORY & PHYSICAL   PRIMARY CARDIOLOGIST:  Dr. Corky Crafts.   CHIEF COMPLAINT:  Dizziness with 4-second pause/sick sinus syndrome,  AFib.   HISTORY OF PRESENT ILLNESS:  A 77 year old female with history of atrial  fibrillation with recent admission for rapid ventricular response and  secondary heart failure with newly discovered sick sinus syndrome with  her LifeWatch monitor, which demonstrated 4-second pause, 3-second  pause, and another 3-second pause at home with associated dizziness.  I  promptly called her and discussed her case with her daughter in April  2009, and we decided to bring her in to [facility] for further  monitoring and for pacemaker placement.   She recently has decreased her Toprol-XL from 100 mg a day to 50 mg a  day and her Cardizem still remains at 360 mg a day.  She is also  recently decreased her amiodarone from 400 mg a day to 200 mg a day  after visit with Tillman Sers, NP.   Currently, she describes some mild dizziness occasionally and the  earlier episodes were associated with diaphoresis.  Occasionally, she  has mild tingling in her chest wall, but no overt chest pain.   She was cardioverted in 01/28/2008, successfully.   PAST MEDICAL HISTORY:  1. Atrial fibrillation, status post cardioversion on 01/28/2008.  2. Diastolic heart failure.  3. Hypertension.  4. Hyperlipidemia.  5. Seasonal allergies.   PAST SURGICAL HISTORY:  Partial hysterectomy, tonsillectomy, and bladder  surgery.   ALLERGIES:  CODEINE and SULFA.   MEDICATIONS AT HOME:  She has been taking:  1. Cardizem CD 360 mg a day.  2. Toprol-XL 50 mg  a day.  3. Coumadin 5 mg at night.  4. Zocor 20 mg at night.  5. Lasix 40 mg a day.  6. Micardis 80 mg a day.  7. Amiodarone 200 mg a day.  She is no longer taking clonidine.   FAMILY HISTORY:  Father died of myocardial infarction at age 4.  Sister  died of myocardial infarction at age 28.   SOCIAL HISTORY:  She lives with her son.  Her daughter April lives next  door.  She denies any tobacco, alcohol, or any illicit drug use.   REVIEW OF SYSTEMS:  Positive for dizziness.  Occasional constipation,  chest tingling, unless specified above, all other 12 review of systems  negative.  She has felt sensation of neck pounding.   PHYSICAL EXAMINATION:  VITAL SIGNS:  Currently, heart rate 38-40, sinus  bradycardia with a blood pressure 148/67, satting 97% on room air.  Temperature is 96.9 with weight of 69.6 kg.  GENERAL:  Alert and oriented x3 in no acute distress, sitting  comfortably up in bed here with  her daughter.  EYES:  Well-perfused conjunctivae.  EOMI.  No scleral icterus.  NECK: Supple.  No lymphadenopathy.  No carotid bruits.  No JVD.  Moist  mucous membranes.  CARDIOVASCULAR:  Bradycardic, regular rhythm with no appreciable  murmurs, rubs, or gallops.  Normal PMI.  LUNGS:  Clear to auscultation bilaterally.  Normal respiratory effort.  ABDOMEN:  Soft and nontender.  Normoactive bowel sounds.  No rebound.  No guarding.  No bruits.  EXTREMITIES:  Trace edema of bilateral ankles with 2+ dorsalis pedis  pulses bilaterally.  NEUROLOGIC:  Nonfocal.  Normal gait.  No tremors.  Normal reflexes.  SKIN:  Warm, dry, and intact.  No rashes.   LABORATORY DATA:  Lab work currently pending.  Chest x-ray pending.  EKG  pending.  Telemetry as described above showing sinus bradycardia, most  recent hemoglobin from December 24, 2007, was 14.2, BNP from December 22, 2007, was 402, creatinine from December 24, 2007, was 0.78 with a  potassium of 3.5.  TSH from December 22, 2007, was 3.24  normal.   ASSESSMENT/PLAN:  A 77 year old female with sick sinus syndrome, tachy-  brady syndrome, atrial fibrillation with hypertension, hyperlipidemia,  and near syncope or dizziness.  1. Sick sinus syndrome - We will hold Coumadin for pacemaker      placement.  Currently, asymptomatic with her bradycardia, does not      require atropine currently.  We will have it at bedside just in      case.  We will also have zol pads at bedside in case of severe      bradycardia.  No need at this time for any temporary pacing      measures.   In regards to her medications, we will hold Cardizem, we will hold  Toprol and we will decrease amiodarone from 200 mg  to 100 mg daily.  Note, that she did have significant amount of rapid ventricular response  with atrial fibrillation recently, and will require some form of  medication to control her atrial fibrillation.  Note 4-second pause on  LifeWatch monitor.  Certainly, qualifies for pacemaker placement.  We  will discuss with Dr. Corliss Marcus.  We will make n.p.o. on Sunday.  1. Atrial fibrillation, currently sinus bradycardia.  We will continue      amiodarone 100 mg a day.  2. Hyperlipidemia - We will continue Zocor.  3. Hypertension - We will continue Micardis.  We will make any      appropriate changes after lab work.  We will place in either a step-      down or CCU bed.      Jake Bathe, MD  Electronically Signed    MCS/MEDQ  D:  03/27/2008  T:  03/28/2008  Job:  [id number]   cc:   Corky Crafts, MD

## 2011-03-27 NOTE — Discharge Summary (Signed)
[name], [name]               ACCOUNT NO.:  [id number]   MEDICAL RECORD NO.:  [id number]           PATIENT TYPE:   LOCATION:                               FACILITY:  [facility]   PHYSICIAN:  Corinna L. Lendell Caprice, MDDATE OF BIRTH:  26-Oct-1926   DATE OF ADMISSION:  05/05/2009  DATE OF DISCHARGE:  05/11/2009                               DISCHARGE SUMMARY   DISCHARGE DIAGNOSES:  1. T11 fracture, status post kyphoplasty by Interventional Radiology.  2. Osteoporosis.  3. Severe constipation.  4. Nausea.  5. Hyperglycemia, needs outpatient followup.  6. Paroxysmal atrial fibrillation with pacemaker.  7. Hypokalemia.  8. Hypertension.  9. Cholelithiasis.  10.History of diastolic dysfunction, compensated.  11.Hypertension.  12.Abnormal colonic thickening on CAT scan, follow up with Dr. Laural Benes      as an outpatient.  13.Urinary tract infection, E-coli from culture.   DISCHARGE MEDICATIONS:  1. Dilaudid 2 mg every 4 hours as needed for pain or Tylenol 650 mg      p.o. q.4 h. p.r.n. pain.  2. Stool softeners or laxatives of choice as needed.  3. Phenergan 12.5 mg every 6 hours as needed for nausea.  4. Continue Diltiazem ER 240 mg a day.  5. Micardis 80 mg a day.  6. Coumadin 5 mg a day or as instructed.  7. Pacerone 200 mg a day.  8. Simvastatin 20 mg a day.  9. Metoprolol XL 150 mg a day.  10.Lasix 40 mg a day.  11.Allegra as needed.  12.Multivitamin a day.  13.Calcium with vitamin D 400 mg a day.  14.Aspirin 81 mg a day.  15.Vitamin D 500 mcg a day.   Consider osteoporosis treatment, defer to primary care physician.   CONDITION:  Stable.   CONSULTATIONS:  Orthopedics and Interventional Radiology.   PROCEDURES:  T11 kyphoplasty.   LABORATORIES:  CBC unremarkable.  INR on admission 2.8, on June 10, 2009, was 1.1.  Basic metabolic panel significant for a glucose of 152,  potassium dropped to a low of 3.2 and was repleted.  LFTs unremarkable.  Hemoglobin A1c 6.7.   Urinalysis on admission, negative; but repeat  urinalysis showed positive nitrite, large leukocyte esterase with 100  protein, 15 ketones, small bilirubin, large hemoglobin, too numerous to  count white cells 11-20 red cells, and many bacteria.  Urine culture  grew out greater than 100,000 colonies of E-coli, which was intermediate  to ampicillin, but sensitive to all else.   SPECIAL STUDIES:  Radiology.  Abdominal film on admission showed a bowel  gas pattern suggestive of ileus with large stool burden noted bibasilar  subsegmental atelectasis.  CT of the abdomen and pelvis showed acute T11  compression fracture with over 50% loss of vertebral height and 5 mm  bony retropulsion causing borderline central stenosis.  Equivocal  filling defect in the distal common bile duct could represent  choledocholithiasis and also a small gallstone bibasilar atelectasis and  abnormal wall thickening along the medial side of the ascending colon.  Follow up colonoscopy suggested ultrasound of the abdomen showed  cholelithiasis without signs of cholecystitis.  Common  bile duct normal  with no common duct stone.   HISTORY AND HOSPITAL COURSE:  Ms. Dumler is a pleasant 77 year old  white female who fell and presented with back pain.  She had fallen 4  days prior to admission.  She had seen Dr. Lestine Box in the office.  She  had also sustained left thigh hematoma.  She had been on Coumadin for  atrial fibrillation.  She had had multiple x-rays in the office and was  sent home with Robaxin.  She had difficulty walking due to the pain and  had not had a bowel movement for about a week.  She complained of  abdominal distention and nausea.  Please see H and P for complete  admission details.  She had no right upper quadrant pain.  Her LFTs were  unremarkable.  She had tenderness over the mid back area, ecchymoses,  and hematoma over the left thigh.  She had normal bowel sounds with  slight abdominal distention  and no tenderness.  She was admitted to the  medical service.  Dr. Lestine Box and all were consulted and recommended  Interventional Radiology consult for kyphoplasty.  The patient was given  supportive care and pain medications with good results.  She continued  to have problems with ambulation, which was improved after kyphoplasty.  She did receive physical therapy and occupational therapy during the  hospitalization.   She was severely constipated on admission.  There were signs of possible  ileus on x-ray, but no clinical signs of this.  She was given an  aggressive bowel regimen with good results.  By the time of discharge,  she was tolerating a diet and having regular bowel movements.   There was a question of a common duct stone seen on CAT scan, but the  clinical picture and laboratory database did not suggest this.  Nevertheless, ultrasound was done and showed no common duct stone.   The patient has no history of diabetes, but was noted to be slightly  hyperglycemic during this hospitalization.  This will need to be  followed up as an outpatient.   She also had an abnormal thickening on CAT scan, see above, will need  outpatient colonoscopy.   The patient's Coumadin was held for procedure and subsequently resumed  by the discharging physician.      Corinna L. Lendell Caprice, MD  Electronically Signed     CLS/MEDQ  D:  06/23/2009  T:  06/23/2009  Job:  [id number]   cc:   Donia Guiles, M.D.  Durene Romans. Lestine Box, MD  Danise Edge, M.D.

## 2011-03-27 NOTE — H&P (Signed)
NAMEJEANNEMARIE, SAWAYA               ACCOUNT NO.:  [id number]   MEDICAL RECORD NO.:  [id number]          PATIENT TYPE:  INP   LOCATION:  5003                         FACILITY:  [facility]   PHYSICIAN:  Corinna L. Lendell Caprice, MDDATE OF BIRTH:  03/16/1926   DATE OF ADMISSION:  05/05/2009  DATE OF DISCHARGE:                              HISTORY & PHYSICAL   CHIEF COMPLAINT:  Back pain and no bowel movement for a week.   HISTORY OF PRESENT ILLNESS:  Ms. Hightower is an 77 year old white female  who fell in the yard after she lost her balance about 4 days ago.  She  had a lot of mid back pain and went to see Dr. Lestine Box.  She is known to  Dr. Darrelyn Hillock.  Apparently, she also sustained an injury to her left thigh  and has a hematoma there.  She is on Coumadin for paroxysmal atrial  fibrillation.  She had multiple x-rays including of the pelvis and spine  in the office.  I have no records.  She was told she had muscles sprain  and was sent home with Robaxin.  Since then she has had increasing  difficulty walking due to the pain.  She can barely turne in bed.  She  also has not had a bowel movement for about a week and her abdomen is  distended.  She reports some nausea and heaves, but is currently  asking for supper.  She is passing gas.  She has no abdominal pain.  She  had a CT of the abdomen and pelvis ordered by the ED physician, which  showed multiple abnormalities.  There a T11 fracture noted with some  paraspinous hematoma suggesting acute fracture.  She also had some  gallstones and a potential filling defect in the common duct, but normal  liver function tests and lipase.  She also had some colonic wall  thickening.   PAST MEDICAL HISTORY:  1. Atrial fibrillation with sick sinus syndrome, status post      pacemaker.  2. History of CHF, secondary to diastolic dysfunction and atrial      fibrillation.  3. Hypertension.  4. Hyperlipidemia.   MEDICATIONS:  1. Coumadin alternating 5 and 2.5  mg a day.  2. Micardis 80 mg a day.  3. Toprol-XL 100 mg a day.  4. Zocor 20 mg a day.  5. Cardizem CD 240 mg a day.  6. Amiodarone 100 mg a day.  7. Robaxin was started recently 500 mg as needed.  8. Lasix 40 mg a day.   ALLERGIES:  She reports an allergy to CODEINE and SULFA.   SOCIAL HISTORY:  Her son lives with her, but she is fairly independent.  She does not smoke or drink.  She is widowed.   FAMILY HISTORY:  Noncontributory.   REVIEW OF SYSTEMS:  As above, otherwise negative.   PHYSICAL EXAMINATION:  VITAL SIGNS:  Temperature is 98.2, blood pressure  161/73, pulse 62, respiratory rate 16, and oxygen saturation 94% on room  air.  GENERAL:  The patient is well-nourished and well-developed, in no acute  distress.  HEENT:  Normocephalic and atraumatic.  Pupils are equal, round, and  reactive to light.  Dry mucous membranes.  NECK:  Supple.  No C-spine tenderness.  No carotid bruits.  LUNGS:  Clear to auscultation bilaterally without wheezes, rhonchi, or  rales.  CARDIOVASCULAR:  Regular rate and rhythm without murmurs, gallops, or  rubs.  ABDOMEN:  She has normal bowel sounds.  She is slightly distended,  nontender.  GU AND RECTAL:  Deferred.  EXTREMITIES:  She has a large hematoma over the left thigh.  She has  full range of motion of the knee and left hip.  No tenderness to  palpation.  She has no edema.  MUSCULOSKELETAL:  She has tenderness over the mid back area.  No  ecchymoses noted.  NEUROLOGIC:  She is alert and oriented.  Cranial nerves are intact.  Motor strength 5/5.  Deep tendon reflexes are diminished bilaterally.  SKIN:  See above.  PSYCHIATRIC:  Normal affect.   LABORATORIES:  INR 2.8 and PTT 52.  Basic metabolic panel unremarkable.  Complete metabolic panel significant for chloride of 94.  Glucose of  152.  Lipase normal.  Urinalysis shows small blood and negative ketones.  Specific gravity 1.007.  Negative nitrite and negative leukocyte  esterase.   EKG showed undetermined rhythm, right bundle-branch block.  Acute abdominal series showed suggestion of ileus with large stool  burden, bibasilar subsegmental atelectasis.  CT of the abdomen and  pelvis showed moderate lower lobe atelectasis.  Gallstones in the  pericholecystic fluid.  Common duct 11 mm and equivocal filling defect  in the distal common bile duct.  T11 compression fracture with 50% loss  of vertebral body height and some degree bony retropulsion not present  in 2009, with some paraspinal hematoma indicating acute fracture.  Abnormal wall thickening along the medial side of the ascending colon  with mild adjacent stranding.   ASSESSMENT AND PLAN:  1. Status post fall with resulting acute T11 fracture:  She is unable      to ambulate or even move around in bed and will be admitted.  I      will hold her Coumadin for now.  I am concerned about the      paraspinous hematoma and I will discuss further with orthopedics      whether or not the Coumadin needs to be held or stopped long-term.      Also, whether or not, she may need further imaging.  She has a      pacemaker and cannot have an MRI.  She will get physical therapy      and occupational therapy.  She has no neurologic deficits to      suggest any cord compression.  She will get physical therapy,      occupational therapy, and pain control.  2. Severe constipation.  Rule out ileus.  I doubt she has an ileus as      she is quite hungry.  She will get Reglan as needed, laxatives and      expectant management.  3. Abnormal filling defect in the common duct with some      cholelithiasis:  The patient has no symptoms of cholecystitis or      common duct obstruction.  She also does not have any leukocytosis      or elevated LFTs.  Nevertheless, I will order a right upper      quadrant ultrasound to further evaluate.  4. Atrial fibrillation with sick sinus  syndrome pacemaker.  See above.  5. Hypertension.  Continue  outpatient medications.  6. History of diastolic dysfunction:  Compensated.  Hold Lasix for      now.  7. Hyperglycemia:  No history of diabetes.  We will check a hemoglobin      A1c and blood glucose.  I suspect this is stress related.  8. Abnormal colonic thickening:  The patient has no symptoms of      colitis, this could potentially be followed up as an outpatient.      Her last colonoscopy was by Dr. Laural Benes in 2006, which showed an      inadequately prepped colon and left colonic diverticulosis.      Corinna L. Lendell Caprice, MD  Electronically Signed     CLS/MEDQ  D:  05/06/2009  T:  05/06/2009  Job:  [id number]   cc:   Leonides Grills, M.D.  Donia Guiles, M.D.  Corky Crafts, MD

## 2011-03-30 NOTE — Procedures (Signed)
[facility]  Patient:    [name], [name]                     MRN: [id number] Proc. Date: 03/06/01 Attending:  Verlin Grills, M.D. CC:         Desma Maxim, M.D.   Procedure Report  PROCEDURE PERFORMED:  Esophagogastroduodenoscopy and flexible proctocolonoscopy to the splenic flexure.  DATE OF BIRTH:  [birth year]  REFERRING PHYSICIAN:  Desma Maxim, M.D.  ENDOSCOPIST:  Verlin Grills, M.D.  INDICATIONS FOR PROCEDURE:  The patient is a 77 year old female.  [name] mother and sister had colon cancer.  [name] underwent a proctocolonoscopy to the cecum on November 11, 1995 which revealed extensive left colonic diverticulosis but no evidence for colorectal neoplasia.  She is scheduled today for attempted surveillance colonoscopy and polypectomy to prevent colon cancer.  [name] is also receiving treatment for chronic gastroesophageal reflux disease.  She occasionally experiences solid food dysphagia.  I discussed with the patient the complications associated with esophagogastroduodenoscopy, Savary esophageal dilation, colonoscopy and polypectomy including intestinal bleeding and intestinal perforation.  The patient has signed the operative permit.  PREMEDICATION:  [name] received intravenous fentanyl and Versed to achieve conscious sedation for the procedure.  PROCEDURE:  Esophagogastroduodenoscopy.  After obtaining informed consent, the patient was placed in the left lateral decubitus position.  I administered intravenous fentanyl and intravenous Versed to achieve conscious sedation for the procedure.  The patients blood pressure, oxygen saturations and cardiac rhythm were monitored throughout the procedure and documented in the medical record.  The Olympus gastroscope was passed through the posterior hypopharynx and the proximal esophagus without difficulty.  The hypopharynx, larynx  and vocal cords appeared normal.  Esophagoscopy:  The proximal, mid and lower segments of the esophagus appeared normal.  Endoscopically, there was no evidence for the presence of erosive esophagitis, esophageal mucosal scarring, esophageal obstruction, Barretts esophagus.  Gastroscopy:  Retroflex view of the gastric cardia and fundus was normal. Endoscopically, [name] has a large hiatal hernia; it appears as though two thirds of her stomach is above the diaphragmatic hiatus.  The gastric body, antrum and pylorus appeared normal.  Duodenoscopy:  The duodenal bulb and descending duodenum appeared normal.  ASSESSMENT:  Extremely large hiatal hernia.  No endoscopic evidence of esophageal obstruction.  RECOMMENDATIONS:  [name] should undergo an upper GI x-ray series to define her hiatal hernia anatomy.  If she has a paraesophageal hernia, she should consider having surgical repair.  PROCEDURE:  Proctocolonoscopy to the splenic flexure.  Anal inspection was normal.  Digital rectal exam was normal.  The Olympus pediatric video colonoscope was then introduced into the rectum and under direct vision, advanced to approximately the splenic flexure.  Due to colonic loop formation, I was unable to complete a full colonoscopy.  Endoscopic appearance of the rectum, sigmoid colon and descending colon reveals extensive left colonic diverticulosis but no endoscopic evidence for the presence of diverticulitis, stricture formation or colorectal neoplasia.  RECOMMENDATIONS:  If clinically warranted, I would recommend that [name] undergo an air contrast barium enema at some point during this year to rule out neoplasia in the transverse colon and right colon which I was unable to examine colonoscopically. DD:  03/06/01 TD:  03/07/01 Job: [id number]

## 2011-03-30 NOTE — Op Note (Signed)
[facility]  Patient:    [name], [name]                   MRN: [id number] Proc. Date: 05/05/01 Adm. Date:  [id number] Attending:  Katha Cabal CC:         Desma Maxim, M.D.  Charolett Bumpers III, M.D.   Operative Report  PREOPERATIVE DIAGNOSIS:  Huge hiatal hernia with intrathoracic stomach.  POSTOPERATIVE DIAGNOSES:  Huge hiatal hernia with intrathoracic stomach; status post laparoscopic takedown and repair of huge hiatal hernia and Nissen fundoplication.  PROCEDURE:  Laparoscopic reduction of intrathoracic stomach with excision of sac and repair of hiatal hernia (primary closure) and Nissen fundoplication.  SURGEON:  Luretha Murphy, M.D.  ASSISTANT:   Darnell Level, M.D.  OPERATING TIME:  3 hours.  CCS#:  [id number]  INDICATIONS FOR PROCEDURE:  This is a 77 year old lady with dysphagia, pressure, and bloating in her chest with GERD and an intrathoracic stomach. When eating, she will often times have pressure and bloating and if she sneezes she has pain in her lower chest. She has dysphagia quite a bit particularly for solids. Upper GI was reviewed which showed a large paraesophageal hernia with a large portion of her stomach in her chest. Informed consent was obtained regarding repair.  DESCRIPTION OF PROCEDURE:  [name] was taken to room one on the afternoon of June 24 and given general anesthesia. The abdomen was prepped with Betadine and draped sterilely. A longitudinal incision was made just above the umbilicus and through the pursestring suture the abdomen was entered and the Hasson cannula was placed. The abdomen was insufflated and four other trocars were placed including a 5 mm in the upper midline for the liver retractor, a 10/11 in the left upper quadrant and two 10/11s in the right upper quadrant.  Depicted in the chart is a huge hiatal defect with stomach encased in the omentum stuck up in the chest. This  was pulled down and I began incising the margins of the crura and then taking down the sac. I proceeded with this along the dissection for about an hour. Dr. Rica Mast passed a 40 bougie which we used to identify the esophagus. It seemed to pass easily. We spent time doing a careful dissection of the crura on both the right and left sides and then I used the Shepherds hook to get around the distal esophagus and then we passed a Penrose drain which we used for subsequent retraction. We have no further lighted bougies available and I did not wish to instrument her esophagus after closing this large hiatal hernia but made sure that the top part of the stomach was well mobilized. With the stomach easily reduced into the abdomen, we then closed the hiatus using four endostitch sutures placed posteriorly followed by a fifth 2-0 pledgetted Prolene to complete the closure of the esophageal hiatus posteriorly. There was a little small window anteriorly and it was felt that this was an adequate amount with bougie were passed. I then was easily able to reach around and grasp the cardia and fundus area and pull a contiguous portion of this area of the stomach around and essentially invaginate the distal esophagus with this wrap. This was then sutured with three sutures using extracorporeal followed by two intracorporeal sutures. Each had a clip on the knot. This completed the Nissen wrap which is depicted in the chart.  There was essentially not bleeding noted. There was no evidence of  a perforation. The liver looked good and the remaining port sites looked fine as did the underneath portion of the stomach that we had been working in. We repaired the umbilical port tying down the pursestring with the scope visualizing that area. I injected all the ports with Marcaine and then closed the wounds with 4-0 Vicryl, Benzoin and Steri-Strips. The patient tolerated the procedure well and was taken to the  recovery room in satisfactory condition. DD:  05/05/01 TD:  05/05/01 Job: [id number]

## 2011-03-30 NOTE — Discharge Summary (Signed)
[facility]  Patient:    [name], [name]                   MRN: [id number] Adm. Date:  [id number] Disc. Date: 05/08/01 Attending:  Katha Cabal CC:         Desma Maxim, M.D.  Charolett Bumpers III, M.D.   Discharge Summary  ADMISSION DIAGNOSIS:  Huge hiatal hernia with intrathoracic stomach.  DISCHARGE DIAGNOSIS:  Status post laparoscopic repair with Nissen fundal plication.  HOSPITAL COURSE:  The patient is a 77 year old lady who came in on June 24 and underwent the above-menitoned laparoscopic procedure.  This is depicted in her chart.  Postoperatively, she had some nausea, but not too bad. A swallow was obtained on June 25, abdomen showed nonspecific esophagitis dysmotility, a good fundal plication wrap was in place, no slip and no leak, and she had satisfactory gastric emptying.  She was begun on liquids.  These were advanced and she was ready for discharge on June 27 on full liquid diet.  A prescription was given for Mepergan Fortis to take for pain.  She was asked to return to the office in two weeks.  Condition good. DD:  05/08/01 TD:  05/08/01 Job: [id number]

## 2011-03-30 NOTE — Op Note (Signed)
NAME:  [name], [name] NO.:  [id number]   MEDICAL RECORD NO.:  [id number]          PATIENT TYPE:  AMB   LOCATION:  ENDO                         FACILITY:  [facility]   PHYSICIAN:  Danise Edge, M.D.   DATE OF BIRTH:  November 09, 1926   DATE OF PROCEDURE:  11/20/2004  DATE OF DISCHARGE:                                 OPERATIVE REPORT   REFERRING PHYSICIAN:  Dr. Donia Guiles, Dr. Artist Pais, Dr. Luretha Murphy   PROCEDURE:  Esophagogastroduodenoscopy and colonoscopy.   PROCEDURE INDICATIONS:  Ms. Lakita Sahlin. Sibilia is a 77 year old female, born  05/25/1926.  Ms. Faulks has undergone a laparoscopic Nissen  fundoplication.  Her sister and mother were diagnosed with colon cancer.   On December 10, 2002, Ms. Donahoe' esophagogastroduodenoscopy was normal  post Nissen fundoplication.  Proctocolonoscopy to the cecum revealed  extensive left colonic diverticulosis but no endoscopic evidence for the  presence of colorectal neoplasia.   Ms. Lueras has difficulty swallowing her Fosamax tablet.  She also has  chronic functional nonbloody diarrhea.   ENDOSCOPIST:  Dr. Reece Agar   PREMEDICATION:  Versed 5 mg, Demerol 50 mg.   PROCEDURE:  Diagnostic esophagogastroduodenoscopy.  After obtaining informed  consent, Ms. Scafidi was placed in the left lateral decubitus position.  I  administered intravenous Demerol and intravenous Versed to achieve conscious  sedation for the procedure.  The patient's blood pressure, oxygen  saturation, and cardiac rhythm were monitored throughout the procedure and  documented in the medical record.   The Olympus gastroscope was passed through the posterior hypopharynx in the  proximal esophagus without difficulty.  The hypopharynx, larynx, and vocal  cords appeared normal.   Esophagoscopy:  The proximal mid and lower segments of the esophageal mucosa  appear completely normal.   Gastroscopy:  Retroflexed view of the gastric  cardia and fundus is normal  post Nissen fundoplication.  The gastric body, antrum, and pylorus appeared  normal.   Duodenoscopy:  The duodenal bulb, second portion of duodenum, and third  portion of duodenum appeared normal.   ASSESSMENT:  Normal esophagogastroduodenoscopy post Nissen fundoplication.   PROCEDURE:  Proctocolonoscopy to the cecum.  Anal inspection and digital  rectal exam were normal.  The Olympus adjustable pediatric colonoscope was  introduced into the rectum and with some degree of difficulty due to colonic  loop formation, advanced to the cecum.  A normal-appearing appendiceal  orifice and ileocecal valve were identified.   Colonic preparation for an accurate screening colonoscopy for colon polyps  was inadequate.  Despite Ms. Dehne history of chronic nonbloody diarrhea,  there was a large amount of liquid and solid stool throughout the colon.   Rectum normal.  Sigmoid colon and descending colon.  Left colonic diverticulosis.  Splenic flexure normal.  Transverse colon normal.  Hepatic flexure normal.  Ascending colon normal.  Cecum and ileocecal valve normal.   Biopsies:  Biopsies were taken from the right colon and left colon to look  for microscopic and collagenous colitis.   ASSESSMENT:  Left colonic diverticulosis.  Inadequately prepped colon for an  accurate screen  for colon polyps.  I did not identify any large polyps or  obstructing tumors throughout the length of Ms. Savich' colon.  Her 2004  colonic prep was much better, and I did not identify any colon polyps.  Biopsies of the colon to rule out microscopic - collagenous colitis pending.           ______________________________  Danise Edge, M.D.     MJ/MEDQ  D:  11/20/2005  T:  11/20/2005  Job:  [id number]   cc:   Donia Guiles, M.D.  Fax: [phone]   [place] Daphine Deutscher, MD  [address]  [place]  [place] [address]   Artist Pais, M.D.  Fax: [phone]

## 2011-03-30 NOTE — Op Note (Signed)
NAME:  [name], [name]                         ACCOUNT NO.:  [id number]   MEDICAL RECORD NO.:  [id number]                   PATIENT TYPE:  AMB   LOCATION:  ENDO                                 FACILITY:   PHYSICIAN:  Danise Edge, M.D.                DATE OF BIRTH:  1926/01/21   DATE OF PROCEDURE:  12/10/2002  DATE OF DISCHARGE:                                 OPERATIVE REPORT   OPERATIVE PROCEDURES:  Esophagogastroduodenoscopy and colonoscopy.   INDICATIONS FOR PROCEDURE:  The patient is a 77 year old female born  1926/01/16.  The patient is undergoing upper and lower  gastrointestinal endoscopy to evaluate guaiac positive stool.  After meals  the patient has watery nonbloody diarrhea.  Approximately two years ago she  underwent a Nissen fundoplication to treat a large paraesophageal hiatal  hernia.   ENDOSCOPIST:  Danise Edge, M.D.   PREMEDICATION:  Demerol 50 mg, Versed 5 mg.   PROCEDURE:  Esophagogastroduodenoscopy.   PROCEDURE IN DETAIL:  After obtaining informed consent the patient was  placed in the left lateral decubitus position.  I administered intravenous  Demerol and intravenous Versed to achieve conscious sedation for the  procedure.  The patient's blood pressure, oxygen saturation, cardiac rhythm  were monitored throughout the procedure and documented in the medical  record.   The Olympus gastroscope was passed through the posterior hypopharynx into  the proximal esophagus without difficulty.  The hypopharynx, the larynx and  the vocal cords appeared normal.   Esophagoscopy:  The proximal, mid and lower segments of the esophagus appear  normal post Nissen fundoplication.  The squamocolumnar junction and  esophagogastric junction are noted at approximately 37 cm from the incisor  teeth.   Gastroscopy:  Retroflexed view of the gastric cardia and fundus was normal.  The diaphragmatic hiatus was not patulous.  The gastric body, antrum and  pylorus  appeared normal.   Duodenoscopy:  The duodenal bulb, mid duodenum and distal duodenum appeared  normal.   ASSESSMENT:  Normal esophagogastroduodenoscopy post Nissen fundoplication to  treat a large paraesophageal hernia.   PROCEDURE:  Proctocolonoscopy to the cecum.  Anal inspection was normal.  Digital rectal exam was normal.  The Olympus pediatric video colonoscope was  introduced into the rectum and advanced to the cecum.  The colonic  preparation for the exam today was satisfactory.   Rectum normal.   Sigmoid colon and descending colon:  Extensive left colonic diverticulosis.   Splenic flexure normal.   Transverse colon normal.   Hepatic flexure normal.   Ascending colon normal.   Cecum and ileocecal valve normal.   ASSESSMENT:  Extensive left colonic diverticulosis; otherwise normal  proctocolonoscopy to the cecum.  No endoscopic evidence for the presence of  colorectal neoplasia or lower gastrointestinal bleeding.   RECOMMENDATIONS:  To treat the patient's postprandial bowel urgency with  watery, nonbloody diarrhea I would recommend starting with p.r.n. Imodium.  If an antidiarrheal is ineffective or if the antidiarrheal causes excessive  constipation I would try sublingual hyoscyamine before meals.                                               Danise Edge, M.D.    MJ/MEDQ  D:  12/10/2002  T:  12/10/2002  Job:  [id number]   cc:   Artist Pais, M.D.  [address], [address]  [place]  [place] [address]  Fax: [phone]   [place]. Daphine Deutscher, M.D.  [address]  [place]  [place] [address]  Fax: [phone]   Donia Guiles, M.D.  [address] [place]  [place] [address]  Fax: [phone]

## 2011-04-18 ENCOUNTER — Emergency Department (HOSPITAL_COMMUNITY): Payer: Medicare Other

## 2011-04-18 ENCOUNTER — Other Ambulatory Visit: Payer: Self-pay | Admitting: Orthopedic Surgery

## 2011-04-18 ENCOUNTER — Emergency Department (HOSPITAL_COMMUNITY)
Admission: EM | Admit: 2011-04-18 | Discharge: 2011-04-18 | Disposition: A | Discharge status: Home / Self Care | Payer: Medicare Other | Attending: Emergency Medicine | Admitting: Emergency Medicine

## 2011-04-18 ENCOUNTER — Other Ambulatory Visit: Payer: Self-pay

## 2011-04-18 DIAGNOSIS — I2510 Atherosclerotic heart disease of native coronary artery without angina pectoris: Secondary | ICD-10-CM | POA: Insufficient documentation

## 2011-04-18 DIAGNOSIS — K5730 Diverticulosis of large intestine without perforation or abscess without bleeding: Secondary | ICD-10-CM | POA: Insufficient documentation

## 2011-04-18 DIAGNOSIS — K5900 Constipation, unspecified: Secondary | ICD-10-CM | POA: Insufficient documentation

## 2011-04-18 DIAGNOSIS — W19XXXA Unspecified fall, initial encounter: Secondary | ICD-10-CM | POA: Insufficient documentation

## 2011-04-18 DIAGNOSIS — I509 Heart failure, unspecified: Secondary | ICD-10-CM | POA: Insufficient documentation

## 2011-04-18 DIAGNOSIS — S32009A Unspecified fracture of unspecified lumbar vertebra, initial encounter for closed fracture: Secondary | ICD-10-CM | POA: Insufficient documentation

## 2011-04-18 DIAGNOSIS — Z7982 Long term (current) use of aspirin: Secondary | ICD-10-CM | POA: Insufficient documentation

## 2011-04-18 DIAGNOSIS — I10 Essential (primary) hypertension: Secondary | ICD-10-CM | POA: Insufficient documentation

## 2011-04-18 DIAGNOSIS — Z950 Presence of cardiac pacemaker: Secondary | ICD-10-CM | POA: Insufficient documentation

## 2011-04-18 DIAGNOSIS — Z79899 Other long term (current) drug therapy: Secondary | ICD-10-CM | POA: Insufficient documentation

## 2011-04-18 DIAGNOSIS — M549 Dorsalgia, unspecified: Secondary | ICD-10-CM

## 2011-04-18 DIAGNOSIS — M810 Age-related osteoporosis without current pathological fracture: Secondary | ICD-10-CM | POA: Insufficient documentation

## 2011-04-18 LAB — DIFFERENTIAL
Eosinophils Absolute: 0.1 10*3/uL (ref 0.0–0.7)
Lymphocytes Relative: 10 % — ABNORMAL LOW (ref 12–46)
Lymphs Abs: 1.2 10*3/uL (ref 0.7–4.0)
Monocytes Relative: 9 % (ref 3–12)
Neutro Abs: 10.3 10*3/uL — ABNORMAL HIGH (ref 1.7–7.7)
Neutrophils Relative %: 80 % — ABNORMAL HIGH (ref 43–77)

## 2011-04-18 LAB — CBC
Hemoglobin: 14.6 g/dL (ref 12.0–15.0)
MCH: 31.7 pg (ref 26.0–34.0)
MCV: 89.1 fL (ref 78.0–100.0)
Platelets: 303 10*3/uL (ref 150–400)
RBC: 4.6 MIL/uL (ref 3.87–5.11)
WBC: 12.8 10*3/uL — ABNORMAL HIGH (ref 4.0–10.5)

## 2011-04-18 LAB — BASIC METABOLIC PANEL
BUN: 14 mg/dL (ref 6–23)
CO2: 31 mEq/L (ref 19–32)
Chloride: 90 mEq/L — ABNORMAL LOW (ref 96–112)
Potassium: 3.9 mEq/L (ref 3.5–5.1)

## 2011-04-30 ENCOUNTER — Encounter (HOSPITAL_COMMUNITY)
Admission: RE | Admit: 2011-04-30 | Discharge: 2011-04-30 | Disposition: A | Discharge status: Home / Self Care | Payer: Medicare Other | Source: Ambulatory Visit | Attending: Orthopedic Surgery | Admitting: Orthopedic Surgery

## 2011-04-30 ENCOUNTER — Other Ambulatory Visit (HOSPITAL_COMMUNITY): Payer: Self-pay | Admitting: Orthopedic Surgery

## 2011-04-30 ENCOUNTER — Ambulatory Visit (HOSPITAL_COMMUNITY)
Admission: RE | Admit: 2011-04-30 | Discharge: 2011-04-30 | Disposition: A | Discharge status: Home / Self Care | Payer: Medicare Other | Source: Ambulatory Visit | Attending: Orthopedic Surgery | Admitting: Orthopedic Surgery

## 2011-04-30 DIAGNOSIS — Z01818 Encounter for other preprocedural examination: Secondary | ICD-10-CM | POA: Insufficient documentation

## 2011-04-30 DIAGNOSIS — S22000A Wedge compression fracture of unspecified thoracic vertebra, initial encounter for closed fracture: Secondary | ICD-10-CM

## 2011-04-30 DIAGNOSIS — M8448XA Pathological fracture, other site, initial encounter for fracture: Secondary | ICD-10-CM | POA: Insufficient documentation

## 2011-04-30 DIAGNOSIS — Z01812 Encounter for preprocedural laboratory examination: Secondary | ICD-10-CM | POA: Insufficient documentation

## 2011-04-30 LAB — CBC
HCT: 41 % (ref 36.0–46.0)
MCH: 30.5 pg (ref 26.0–34.0)
MCV: 90.7 fL (ref 78.0–100.0)
RBC: 4.52 MIL/uL (ref 3.87–5.11)
RDW: 12.6 % (ref 11.5–15.5)
WBC: 8.2 10*3/uL (ref 4.0–10.5)

## 2011-04-30 LAB — BASIC METABOLIC PANEL
BUN: 10 mg/dL (ref 6–23)
CO2: 31 mEq/L (ref 19–32)
Calcium: 9.9 mg/dL (ref 8.4–10.5)
Chloride: 94 mEq/L — ABNORMAL LOW (ref 96–112)
Creatinine, Ser: 0.78 mg/dL (ref 0.50–1.10)

## 2011-05-02 ENCOUNTER — Ambulatory Visit (HOSPITAL_COMMUNITY)
Admission: RE | Admit: 2011-05-02 | Discharge: 2011-05-03 | Disposition: A | Discharge status: Home Health | Payer: Medicare Other | Source: Ambulatory Visit | Attending: Orthopedic Surgery | Admitting: Orthopedic Surgery

## 2011-05-02 ENCOUNTER — Ambulatory Visit (HOSPITAL_COMMUNITY): Payer: Medicare Other

## 2011-05-02 DIAGNOSIS — I10 Essential (primary) hypertension: Secondary | ICD-10-CM | POA: Insufficient documentation

## 2011-05-02 DIAGNOSIS — M8448XA Pathological fracture, other site, initial encounter for fracture: Secondary | ICD-10-CM | POA: Insufficient documentation

## 2011-05-02 DIAGNOSIS — I4891 Unspecified atrial fibrillation: Secondary | ICD-10-CM | POA: Insufficient documentation

## 2011-05-02 DIAGNOSIS — Z01812 Encounter for preprocedural laboratory examination: Secondary | ICD-10-CM | POA: Insufficient documentation

## 2011-05-02 DIAGNOSIS — Z950 Presence of cardiac pacemaker: Secondary | ICD-10-CM | POA: Insufficient documentation

## 2011-05-15 NOTE — Op Note (Addendum)
NAMEMarland Novak  ZIERRA, [name] NO.:  [id number]  MEDICAL RECORD NO.:  [id number]  LOCATION:  XRAY                         FACILITY:  [facility]  PHYSICIAN:  Alvy Beal, MD    DATE OF BIRTH:  1926-01-02  DATE OF PROCEDURE: DATE OF DISCHARGE:  04/30/2011                              OPERATIVE REPORT   PREOPERATIVE DIAGNOSIS:  Pathological compression fracture T12 and L1.  POSTOPERATIVE DIAGNOSIS:  Pathological compression fracture T12 and L1.  OPERATIVE PROCEDURE:  Kyphoplasty, T12-L1.  COMPLICATIONS:  None.  CONDITION:  Stable.  HISTORY:  This is a very pleasant 77 year old who had a previous T11 kyphoplasty in the past and has had a recurrent fracture, adjacent segment fracture at T12.  Initially when I saw her, it was just T12 that was broken and she was having severe debilitating pain.  After failed attempts at conservative management, we elected to proceed with T12 kyphoplasty.  During the process of her preoperative clearance, she developed an L1 kyphoplasty and so amended the procedure to include that level.  All appropriate risks, benefits, and alternatives were discussed with the patient.  Consent was obtained.  OPERATIVE NOTE:  The patient was brought to the operating room, placed supine on the operating table.  After successful induction of general anesthesia and endotracheal intubation, TEDs, SCDs and Foley were inserted.  After successful induction of anesthesia, she was placed prone onto a chest and pelvic roll.  The arms were secured at the side and the back was prepped, draped in standard fashion.  We did time-out to confirm patient procedure and all other pertinent important data. Using 2 fluoro machines, one in the AP and the other in the lateral planes, I identified the T12 fracture.  Identified the lateral border of pedicle, made a stab incision and advanced the trocar down to the lateral aspect of the pedicle.  I then advanced it to  transpedicular into the vertebral body.  Care was taken to ensure that it did not violate the medial border of the pedicle before it was within the vertebral body itself.  Once I had cannulated both sides, I then placed the curette to create a cavity.  Then I placed the inflatable bone tamp. I inflated appropriately and then inserted the cement across midline flow of the cement and I had a good calm of cement from the inferior to the superior endplate.  At no point was there evidence of inferior or superior breach of the endplates nor was there any evidence of anterior or posterior breach.  Once the cement was allowed to harden, I was very pleased with the reduction as well as the cement position.  At this point, I then made a second set of stab incisions at the L1 pedicle.  On the left side, I was able to easily do a transpedicular approach.  On the right, the pedicle was somewhat malformed and I could not get a good position.  As a result, I elected not to put the right trocar in. However, with the curette, I was able to create a adequate space so that the inflatable bone tamp that I put in from the left side was able to  get to midline.  I inflated and again I had good inferior to superior endplate fill.  No breach with the inflatable bone tamp.  At this point, I then inserted the cement and again I had good midline crossover over the cement and good fill from anterior posterior and from superior to inferior.  Again there was no evidence of breach.  At this point once the cement had hardened, I washed the skin, closed the stab incisions with interrupted 4-0 Monocryl stitches and then put Dermabond and Band-Aids. The patient was extubated, transferred to the PACU without incident.  At the end of the case all needle and sponge counts were correct.  No adverse intraoperative events.     Alvy Beal, MD     DDB/MEDQ  D:  05/02/2011  T:  05/03/2011  Job:   [id number]  Electronically Signed by Venita Lick MD on 05/26/2011 08:14:25 AM

## 2011-08-03 LAB — DIFFERENTIAL
Eosinophils Absolute: 0.2
Lymphs Abs: 2
Monocytes Absolute: 0.6
Monocytes Relative: 8
Neutrophils Relative %: 61

## 2011-08-03 LAB — COMPREHENSIVE METABOLIC PANEL
ALT: 24
AST: 26
Albumin: 4
Calcium: 10
GFR calc Af Amer: >60
Glucose, Bld: 132 — ABNORMAL HIGH
Sodium: 138
Total Protein: 7.3

## 2011-08-03 LAB — CBC
HCT: 37.1
MCHC: 33.6
MCV: 89.3
Platelets: 269
RBC: 4.16
RBC: 4.52
RBC: 4.71
RDW: 13.2
WBC: 6.6
WBC: 7.1

## 2011-08-03 LAB — BASIC METABOLIC PANEL
BUN: 6
CO2: 30
CO2: 30
Calcium: 9.2
Calcium: 9.2
Chloride: 98
Creatinine, Ser: 0.78
GFR calc Af Amer: >60
GFR calc Af Amer: >60
Sodium: 136

## 2011-08-03 LAB — CK TOTAL AND CKMB (NOT AT ARMC)
CK, MB: 3
Relative Index: INVALID
Total CK: 67

## 2011-08-03 LAB — CARDIAC PANEL(CRET KIN+CKTOT+MB+TROPI)
CK, MB: 2.6
Relative Index: INVALID
Relative Index: INVALID
Total CK: 49
Total CK: 53

## 2011-08-03 LAB — PROTIME-INR
INR: 1
Prothrombin Time: 13.7

## 2011-08-03 LAB — TSH: TSH: 3.244

## 2011-08-03 LAB — HEPARIN LEVEL (UNFRACTIONATED): Heparin Unfractionated: 0.46

## 2011-08-08 LAB — PROTIME-INR
INR: 1.3
INR: 2.2 — ABNORMAL HIGH
INR: 2.5 — ABNORMAL HIGH
Prothrombin Time: 16.6 — ABNORMAL HIGH
Prothrombin Time: 25.5 — ABNORMAL HIGH
Prothrombin Time: 27.8 — ABNORMAL HIGH
Prothrombin Time: 28 — ABNORMAL HIGH

## 2011-08-08 LAB — CBC
HCT: 38.3
HCT: 38.4
Hemoglobin: 13.1
Hemoglobin: 14.4
MCHC: 34
MCHC: 35.2
MCHC: 35.3
MCV: 89.9
MCV: 90.1
Platelets: 228
Platelets: 229
RDW: 14.5
RDW: 14.7
RDW: 14.7
RDW: 14.7
WBC: 7.3

## 2011-08-08 LAB — BASIC METABOLIC PANEL
BUN: 12
BUN: 13
BUN: 15
CO2: 28
Calcium: 8.8
Calcium: 9.1
Chloride: 100
Chloride: 99
Creatinine, Ser: 0.78
Creatinine, Ser: 0.88
Creatinine, Ser: 0.92
GFR calc Af Amer: >60
GFR calc non Af Amer: >60
GFR calc non Af Amer: >60
Glucose, Bld: 104 — ABNORMAL HIGH
Glucose, Bld: 127 — ABNORMAL HIGH
Potassium: 4
Sodium: 134 — ABNORMAL LOW

## 2011-08-08 LAB — DIFFERENTIAL
Basophils Relative: 0
Lymphs Abs: 2.1
Monocytes Relative: 9
Neutro Abs: 4.6
Neutrophils Relative %: 61

## 2011-08-08 LAB — MAGNESIUM: Magnesium: 2.1

## 2011-08-08 LAB — TYPE AND SCREEN

## 2011-08-08 LAB — COMPREHENSIVE METABOLIC PANEL
BUN: 14
CO2: 28
Calcium: 9.6
GFR calc non Af Amer: 53 — ABNORMAL LOW
Glucose, Bld: 119 — ABNORMAL HIGH
Total Protein: 7

## 2011-08-08 LAB — TSH: TSH: 4.71

## 2011-08-08 LAB — ABO/RH: ABO/RH(D): B POS

## 2011-08-08 LAB — APTT: aPTT: 40 — ABNORMAL HIGH

## 2011-09-07 ENCOUNTER — Other Ambulatory Visit: Payer: Self-pay | Admitting: Family Medicine

## 2011-09-07 DIAGNOSIS — R110 Nausea: Secondary | ICD-10-CM

## 2011-09-10 ENCOUNTER — Ambulatory Visit
Admission: RE | Admit: 2011-09-10 | Discharge: 2011-09-10 | Disposition: A | Discharge status: Home / Self Care | Payer: Medicare Other | Source: Ambulatory Visit | Attending: Family Medicine | Admitting: Family Medicine

## 2011-09-10 DIAGNOSIS — R110 Nausea: Secondary | ICD-10-CM

## 2011-09-12 ENCOUNTER — Encounter (INDEPENDENT_AMBULATORY_CARE_PROVIDER_SITE_OTHER): Payer: Self-pay | Admitting: General Surgery

## 2011-09-14 ENCOUNTER — Encounter (INDEPENDENT_AMBULATORY_CARE_PROVIDER_SITE_OTHER): Payer: Self-pay | Admitting: Surgery

## 2011-09-14 ENCOUNTER — Ambulatory Visit (INDEPENDENT_AMBULATORY_CARE_PROVIDER_SITE_OTHER): Payer: Medicare Other | Admitting: Surgery

## 2011-09-14 VITALS — BP 142/82 | HR 70 | Temp 98.6°F | Resp 16 | Ht 64.0 in | Wt 133.4 lb

## 2011-09-14 DIAGNOSIS — K8020 Calculus of gallbladder without cholecystitis without obstruction: Secondary | ICD-10-CM

## 2011-09-14 NOTE — Progress Notes (Signed)
Mrs. Boller comes in today for evaluation of her gallbladder. In June of 2002 I performed a laparoscopic hiatal hernia repair and Nissen fundoplication for a giant hiatal hernia. In addition I did a gastropexy. At that time her main problems were dysphasia, chest pain and bloating with curved. Upper GI showed an intrathoracic stomach. At my last followup and to in 06 she had an upper GI series that showed her repair was intact.  She has had some prolonged bouts of nausea and vomiting. These were associated with some right-sided abdominal pain. She also lost weight during this period. Today she looks good and is not having any problems with nausea or pain.  I examined her and there was no palpable pain or masses in her abdomen. I gave her a copy of her laparoscopic cholecystectomy book describing in some detail. She seems to understand about this but for the present time does not want to commit to surgery. I certainly understand that at her age and told her that I would be glad to see her should her symptoms recur or get to be more of a nuisance.  I will plan to see her again as needed and certainly if she has recurrent pain she knows to call the office so that I can get her into we would thin go forward with cholecystectomy.  Plan return p.r.n.

## 2011-09-14 NOTE — Patient Instructions (Signed)
Return if you began having recurrent symptoms of nausea or right-sided abdominal pain.

## 2011-10-08 ENCOUNTER — Telehealth (INDEPENDENT_AMBULATORY_CARE_PROVIDER_SITE_OTHER): Payer: Self-pay | Admitting: General Surgery

## 2011-10-08 NOTE — Telephone Encounter (Signed)
The patient contacted the office and stated she is ready to have her gall bladder removed. She is hoping to do it sometime in January.

## 2011-10-10 NOTE — Telephone Encounter (Signed)
Get her scheduled

## 2011-11-13 DIAGNOSIS — Z8719 Personal history of other diseases of the digestive system: Secondary | ICD-10-CM

## 2011-11-13 HISTORY — DX: Personal history of other diseases of the digestive system: Z87.19

## 2011-12-10 ENCOUNTER — Encounter (HOSPITAL_COMMUNITY): Payer: Self-pay | Admitting: *Deleted

## 2011-12-10 ENCOUNTER — Inpatient Hospital Stay (HOSPITAL_COMMUNITY)
Admission: EM | Admit: 2011-12-10 | Discharge: 2011-12-11 | DRG: 379 | Disposition: A | Discharge status: Home / Self Care | Payer: Medicare Other | Attending: Internal Medicine | Admitting: Internal Medicine

## 2011-12-10 DIAGNOSIS — I4891 Unspecified atrial fibrillation: Secondary | ICD-10-CM | POA: Diagnosis present

## 2011-12-10 DIAGNOSIS — K219 Gastro-esophageal reflux disease without esophagitis: Secondary | ICD-10-CM | POA: Diagnosis present

## 2011-12-10 DIAGNOSIS — R7309 Other abnormal glucose: Secondary | ICD-10-CM | POA: Diagnosis present

## 2011-12-10 DIAGNOSIS — Z950 Presence of cardiac pacemaker: Secondary | ICD-10-CM

## 2011-12-10 DIAGNOSIS — R739 Hyperglycemia, unspecified: Secondary | ICD-10-CM | POA: Diagnosis present

## 2011-12-10 DIAGNOSIS — E876 Hypokalemia: Secondary | ICD-10-CM | POA: Diagnosis present

## 2011-12-10 DIAGNOSIS — M810 Age-related osteoporosis without current pathological fracture: Secondary | ICD-10-CM | POA: Diagnosis present

## 2011-12-10 DIAGNOSIS — K922 Gastrointestinal hemorrhage, unspecified: Secondary | ICD-10-CM | POA: Diagnosis present

## 2011-12-10 DIAGNOSIS — I482 Chronic atrial fibrillation, unspecified: Secondary | ICD-10-CM | POA: Diagnosis present

## 2011-12-10 DIAGNOSIS — K5731 Diverticulosis of large intestine without perforation or abscess with bleeding: Principal | ICD-10-CM | POA: Diagnosis present

## 2011-12-10 HISTORY — DX: Essential (primary) hypertension: I10

## 2011-12-10 HISTORY — DX: Diverticulosis of large intestine without perforation or abscess with bleeding: K57.31

## 2011-12-10 HISTORY — DX: Other ill-defined heart diseases: I51.89

## 2011-12-10 LAB — CBC
Hemoglobin: 11 g/dL — ABNORMAL LOW (ref 12.0–15.0)
MCH: 31.5 pg (ref 26.0–34.0)
MCHC: 33.1 g/dL (ref 30.0–36.0)
MCHC: 33.2 g/dL (ref 30.0–36.0)
Platelets: 303 10*3/uL (ref 150–400)
Platelets: 297 10*3/uL (ref 150–400)
Platelets: 328 10*3/uL (ref 150–400)
RBC: 3.89 MIL/uL (ref 3.87–5.11)
RDW: 12.5 % (ref 11.5–15.5)
RDW: 12.5 % (ref 11.5–15.5)
RDW: 12.4 % (ref 11.5–15.5)
WBC: 6.8 10*3/uL (ref 4.0–10.5)
WBC: 8.6 10*3/uL (ref 4.0–10.5)

## 2011-12-10 LAB — TYPE AND SCREEN
ABO/RH(D): B POS
Antibody Screen: NEGATIVE

## 2011-12-10 LAB — BASIC METABOLIC PANEL
BUN: 11 mg/dL (ref 6–23)
Calcium: 9.7 mg/dL (ref 8.4–10.5)
Chloride: 97 mEq/L (ref 96–112)
Creatinine, Ser: 0.65 mg/dL (ref 0.50–1.10)
GFR calc Af Amer: >90 mL/min (ref >90)
GFR calc non Af Amer: 80 mL/min — ABNORMAL LOW (ref >90)

## 2011-12-10 LAB — ABO/RH: Weak D: POSITIVE

## 2011-12-10 MED ORDER — ZOLPIDEM TARTRATE 5 MG PO TABS
5.0000 mg | ORAL_TABLET | Freq: Every evening | ORAL | Status: DC | PRN
  Administered 2011-12-10: 5 mg via ORAL
  Filled 2011-12-10: qty 1

## 2011-12-10 MED ORDER — ACETAMINOPHEN 325 MG PO TABS: 650.0000 mg | ORAL_TABLET | Freq: Four times a day (QID) | ORAL | Status: DC | PRN

## 2011-12-10 MED ORDER — AMIODARONE HCL 200 MG PO TABS
200.0000 mg | ORAL_TABLET | Freq: Every day | ORAL | Status: DC
  Administered 2011-12-11: 200 mg via ORAL
  Filled 2011-12-10: qty 1

## 2011-12-10 MED ORDER — FUROSEMIDE 40 MG PO TABS
40.0000 mg | ORAL_TABLET | Freq: Every day | ORAL | Status: DC
  Administered 2011-12-10 – 2011-12-11 (×2): 40 mg via ORAL
  Filled 2011-12-10 (×2): qty 1

## 2011-12-10 MED ORDER — ACETAMINOPHEN 650 MG RE SUPP: 650.0000 mg | Freq: Four times a day (QID) | RECTAL | Status: DC | PRN

## 2011-12-10 MED ORDER — ALUM & MAG HYDROXIDE-SIMETH 200-200-20 MG/5ML PO SUSP: 30.0000 mL | Freq: Four times a day (QID) | ORAL | Status: DC | PRN

## 2011-12-10 MED ORDER — DILTIAZEM HCL ER 240 MG PO CP24
240.0000 mg | ORAL_CAPSULE | Freq: Every day | ORAL | Status: DC
  Administered 2011-12-11: 240 mg via ORAL
  Filled 2011-12-10: qty 1

## 2011-12-10 MED ORDER — ONDANSETRON HCL 4 MG/2ML IJ SOLN: 4.0000 mg | Freq: Four times a day (QID) | INTRAMUSCULAR | Status: DC | PRN

## 2011-12-10 MED ORDER — LEVOTHYROXINE SODIUM 50 MCG PO TABS
50.0000 ug | ORAL_TABLET | Freq: Every day | ORAL | Status: DC
  Administered 2011-12-11: 50 ug via ORAL
  Filled 2011-12-10: qty 1

## 2011-12-10 MED ORDER — SODIUM CHLORIDE 0.9 % IV SOLN
999.0000 mL | INTRAVENOUS | Status: DC
  Administered 2011-12-10: 100 mL via INTRAVENOUS

## 2011-12-10 MED ORDER — SODIUM CHLORIDE 0.9 % IV SOLN
INTRAVENOUS | Status: DC
  Administered 2011-12-11: 01:00:00 via INTRAVENOUS

## 2011-12-10 MED ORDER — PANTOPRAZOLE SODIUM 40 MG IV SOLR
40.0000 mg | INTRAVENOUS | Status: DC
  Administered 2011-12-10: 40 mg via INTRAVENOUS
  Filled 2011-12-10 (×2): qty 40

## 2011-12-10 MED ORDER — ONDANSETRON HCL 4 MG PO TABS: 4.0000 mg | ORAL_TABLET | Freq: Four times a day (QID) | ORAL | Status: DC | PRN

## 2011-12-10 NOTE — ED Notes (Signed)
Pt reports episode of bloody stool since this am. Approx 4-5 times. Also reports generalized abd pain.

## 2011-12-10 NOTE — ED Provider Notes (Signed)
History     CSN: [id number]  Arrival date & time 12/10/11  1122   First MD Initiated Contact with Patient 12/10/11 1137      Chief Complaint  Patient presents with  . Rectal Bleeding    (Consider location/radiation/quality/duration/timing/severity/associated sxs/prior treatment) Patient is a 78 y.o. female presenting with hematochezia. The history is provided by the patient and a relative.  Rectal Bleeding  Pertinent negatives include no fever, no abdominal pain, no diarrhea, no nausea, no vomiting, no hematuria and no headaches.   the patient is an 78 year old, female, presents to emergency department complaining of painless rectal bleeding.  Today.  She denies nausea or vomiting.  She denies lightheadedness, or shortness of breath.  She takes a half an aspirin a day, but no other anticoagulants.  She had a similar episode in 2011.  Dr. Laural Benes, her gastroenterologist, is a colonoscopy, and found.  Diverticulosis.  The patient's mother died of colon cancer.  The patient denies pain or any symptoms at this time  Past Medical History  Diagnosis Date  . HBP (high blood pressure)   . Hyperlipidemia   . Allergy   . Osteoporosis   . History of gastroesophageal reflux (GERD)   . Afib   . History of sick sinus syndrome   . S/P Nissen fundoplication (without gastrostomy tube) procedure   . Diverticulitis     Past Surgical History  Procedure Date  . Pacemaker insertion     No family history on file.  History  Substance Use Topics  . Smoking status: Former Games developer  . Smokeless tobacco: Not on file  . Alcohol Use: No    OB History    Grav Para Term Preterm Abortions TAB SAB Ect Mult Living                  Review of Systems  Constitutional: Negative for fever and chills.  HENT: Negative for nosebleeds.   Respiratory: Negative for shortness of breath.   Cardiovascular: Negative for palpitations.  Gastrointestinal: Positive for hematochezia and anal bleeding. Negative for  nausea, vomiting, abdominal pain, diarrhea and constipation.  Genitourinary: Negative for hematuria.  Skin: Negative for pallor.  Neurological: Negative for dizziness, light-headedness and headaches.  Hematological: Does not bruise/bleed easily.  Psychiatric/Behavioral: Negative for confusion.    Allergies  Ace inhibitors; Aspirin; Codeine; Sulfa drugs cross reactors; and Tramadol hcl  Home Medications   Current Outpatient Rx  Name Route Sig Dispense Refill  . AMIODARONE HCL 200 MG PO TABS Oral Take 200 mg by mouth daily. 1/2 tab daily     . ASPIRIN 81 MG PO TABS Oral Take 81 mg by mouth daily.      Marland Kitchen DILTIAZEM HCL 240 MG PO CP24 Oral Take 240 mg by mouth daily.      Marland Kitchen ESOMEPRAZOLE MAGNESIUM 40 MG PO CPDR Oral Take 40 mg by mouth daily before breakfast.      . FUROSEMIDE 40 MG PO TABS Oral Take 40 mg by mouth 1 day or 1 dose.      Marland Kitchen HYDROCODONE-ACETAMINOPHEN 7.5-325 MG PO TABS Oral Take 1 tablet by mouth every 6 (six) hours as needed. 1-2 tabs q6 prn pain     . LEVOTHYROXINE SODIUM 50 MCG PO TABS Oral Take 50 mcg by mouth daily. 1/2 tab qd     . LOSARTAN POTASSIUM PO Oral Take 100 mg by mouth daily.      Marland Kitchen METOPROLOL SUCCINATE ER 200 MG PO TB24 Oral Take 200 mg by  mouth daily.      . MULTIVITAMINS PO CAPS Oral Take 1 capsule by mouth daily.      Marland Kitchen ALIGN PO Oral Take by mouth.      Marland Kitchen SIMVASTATIN 40 MG PO TABS Oral Take 20 mg by mouth at bedtime.      . TRAMADOL HCL 50 MG PO TABS Oral Take 50 mg by mouth every 6 (six) hours as needed. Maximum dose= 8 tablets per day     . TRAMADOL HCL 50 MG PO TBDP Oral Take by mouth.        BP 150/66  Pulse 60  Temp(Src) 97.7 F (36.5 C) (Oral)  Resp 14  Ht 5\' 4"  (1.626 m)  Wt 132 lb (59.875 kg)  BMI 22.66 kg/m2  SpO2 96%  Physical Exam  Vitals reviewed. Constitutional: She is oriented to person, place, and time. She appears well-developed and well-nourished.  HENT:  Head: Normocephalic and atraumatic.  Eyes: Conjunctivae are normal.  Pupils are equal, round, and reactive to light.  Neck: Normal range of motion. Neck supple.  Cardiovascular: Normal rate and regular rhythm.   No murmur heard.      Capillary refill less than 2 seconds  Pulmonary/Chest: Effort normal and breath sounds normal. No respiratory distress.  Abdominal: Soft. She exhibits no mass. There is no tenderness. There is no guarding.  Genitourinary: Guaiac positive stool.  Musculoskeletal: Normal range of motion.  Neurological: She is alert and oriented to person, place, and time.  Skin: Skin is warm and dry. No pallor.  Psychiatric: She has a normal mood and affect.    ED Course  Procedures (including critical care time) 78 year old, female, with a history of diverticulosis presents with painless rectal bleeding.  Today.  She has no shortness of breath, or lightheadedness.  She is not hypotensive or tachycardic at this time.  Her abdomen is benign.  We will establish an IV and perform laboratory testing, and then discuss this with her primary care Dr. and/or gastroenterologist.   Labs Reviewed  BASIC METABOLIC PANEL  CBC  TYPE AND SCREEN   No results found.   No diagnosis found.  1:14 PM I spoke with Dr. Laural Benes.  He thinks she needs 24 hours obs.  He does not think she needs repeat colonoscopy now.   rec's triad admission.  1:43 PM  Spoke with Dr. Irene Limbo. He will admit for further eval and tx  MDM  GI bleed- history of diverticulosis, which I think this is a recurrence of bleeding from that source. No anemia.  No orthostatic symptoms.  No hypotension.  No tachycardia, but patient is on a beta blocker.        Nicholes Stairs, MD 12/10/11 1344

## 2011-12-10 NOTE — H&P (Signed)
History and Physical  [name] RUE:[id number] DOB: 07-Jan-1926 DOA: 12/10/2011  Referring physician: PCP: Angelica Raider, MD, MD Gastroenterologist: [name], M.D.  Cardiologist: Angelica Savannah, MD  Chief Complaint: Bleeding  HPI:  78 year old woman presented to the emergency department with painless rectal bleeding (4-5 episodes today). No fever, abdominal pain, diarrhea, nausea, vomiting, hematuria, headaches. Patient takes aspirin but no other antiplatelet agents. No anticoagulants. Per emergency department physician discussed the case with Dr. Laural Novak. He recommended admission but formal consultation was deferred.  The patient reports being in her usual state of health until she had bleeding today.   Admitted July 2011, discharge diagnoses: Lower GI bleed (while on warfarin). Atrial fibrillation, warfarin on hold. No endoscopy performed at that time.  Colonoscopy November 2010: Per Dr. Marlane Novak consults: Has had multiple colonoscopies by Dr. Laural Novak, the last one being in November 2010 which showed diverticula but no polyps, although the prep was suboptimal.  She also had an upper endoscopy at that time which did not show any abnormalities per Dr. Ewing Novak.   Review of Systems:  No changes her vision, sore throat, rash. Positive for chronic right shoulder pain. No chest pain, shortness of breath, dysuria, abdominal pain.  Past Medical History  Diagnosis Date  . Hypertension   . Hyperlipidemia   . Allergy   . Osteoporosis   . History of gastroesophageal reflux (GERD)   . Afib     No Coumadin secondary to history of GI bleed.  Marland Kitchen History of sick sinus syndrome   . Diverticulosis of colon with hemorrhage   . Diastolic dysfunction    Past Surgical History  Procedure Date  . Pacemaker insertion   . Vaginal hysterectomy   . Nissen fundoplication   . Laparoscopic reduction of intrathoracic stomach and repair of hiatal hernia   . Kyphosis surgery     X3   Social History:   reports that she has quit smoking. She does not have any smokeless tobacco history on file. She reports that she does not drink alcohol or use illicit drugs. Son lives with her.  Allergies  Allergen Reactions  . Ace Inhibitors Cough  . Aspirin Nausea Only  . Codeine Nausea Only  . Sulfa Drugs Cross Reactors Nausea Only  . Tramadol Hcl Other (See Comments)    Dry mouth, nausea,weakness    Family History  Problem Relation Age of Onset  . Colon cancer Mother     Prior to Admission medications   Medication Sig Start Date End Date Taking? Authorizing Provider  amiodarone (PACERONE) 200 MG tablet Take 200 mg by mouth daily. 1/2 tab daily    Yes Historical Provider, MD  aspirin 81 MG tablet Take 81 mg by mouth daily.     Yes Historical Provider, MD  Calcium Carbonate-Vitamin D (CALCIUM 600 + D PO) Take 1 tablet by mouth daily.   Yes Historical Provider, MD  cholecalciferol (VITAMIN D) 1000 UNITS tablet Take 1,000 Units by mouth daily.   Yes Historical Provider, MD  diltiazem (DILACOR XR) 240 MG 24 hr capsule Take 240 mg by mouth daily.     Yes Historical Provider, MD  docusate sodium (COLACE) 100 MG capsule Take 100 mg by mouth 2 (two) times daily.   Yes Historical Provider, MD  esomeprazole (NEXIUM) 40 MG capsule Take 40 mg by mouth daily before breakfast. Patient only takes when needed   Yes Historical Provider, MD  furosemide (LASIX) 40 MG tablet Take 40 mg by mouth daily.    Yes Historical  Provider, MD  HYDROcodone-acetaminophen (NORCO) 7.5-325 MG per tablet Take 1 tablet by mouth every 6 (six) hours as needed. 1-2 tabs q6 prn pain   Yes Historical Provider, MD  levothyroxine (SYNTHROID) 50 MCG tablet Take 50 mcg by mouth daily. 1/2 tab qd    Yes Historical Provider, MD  LOSARTAN POTASSIUM PO Take 100 mg by mouth daily.     Yes Historical Provider, MD  metoprolol (TOPROL-XL) 200 MG 24 hr tablet Take 200 mg by mouth daily.    Yes Historical Provider, MD  Multiple Vitamin (MULTIVITAMIN)  capsule Take 1 capsule by mouth daily.     Yes Historical Provider, MD  omega-3 acid ethyl esters (LOVAZA) 1 G capsule Take 2 g by mouth daily.   Yes Historical Provider, MD   Physical Exam: Filed Vitals:   12/10/11 1142  BP: 150/66  Pulse: 60  Temp: 97.7 F (36.5 C)  TempSrc: Oral  Resp: 14  Height: 5\' 4"  (1.626 m)  Weight: 59.875 kg (132 lb)  SpO2: 96%     General:  Appears calm and comfortable. Eating lunch as I entered the room.  Eyes: Pupils equal, round, 2 mm. Minimally reactive. Normal lids, irises, conjunctiva.  ENT: Grossly normal lips and tongue. Grossly normal hearing.  Neck: No lymphadenopathy or masses. No thyromegaly.  Cardiovascular: Regular rate and rhythm. No murmur, rub, gallop. 1+ bilateral lower extremity edema. 2+ dorsalis pedis pulses bilaterally.  Respiratory: Clear to auscultation bilaterally. No wheezes, rales, rhonchi. No respiratory effort.  Abdomen: Soft, nontender, nondistended.  Rectal: No external hemorrhoids seen. Blood around it is noted.  Skin: Appears grossly unremarkable.  Musculoskeletal: Grossly normal tone and strength in the upper and lower extremities bilaterally.  Psychiatric: Grossly normal mood and affect. Speech is fluent and appropriate.  Neurologic: Grossly nonfocal.  Labs on Admission:  Basic Metabolic Panel:  Lab 12/10/11 4696  NA 134*  K 3.8  CL 97  CO2 28  GLUCOSE 158*  BUN 11  CREATININE 0.65  CALCIUM 9.7  MG --  PHOS --   CBC:  Lab 12/10/11 1209  WBC 6.8  NEUTROABS --  HGB 12.3  HCT 37.2  MCV 95.6  PLT 303   Radiological Exams on Admission: None  EKG: None.  Assessment/Plan 1. GI bleed: Presumably lower. Presumably diverticular in nature. Hold aspirin. Patient has been typed and screened. Serial CBC. Appears hemodynamically stable. If the patient has a significant drop in her hemoglobin or frank hemorrhage/increased bleeding or develops instability then would consider nuclear medicine RBC  tagged study and an angiogram. Colonoscopy would probably be very low yield in this patient. Discussed with Dr. Danise Novak. He is not available for endoscopy procedures. Briefly discussed with Dr. Ewing Novak but no formal consult placed. He concurs with plan above.  2. Hyperglycemia: Random. Followup fasting blood sugar in the morning with a base metabolic panel. 3. Hypertension: Stable. Continue antihypertensives. 4. History of sick sinus syndrome/atrial fibrillation: Not a Coumadin candidate secondary to history of GI bleed. Continue amiodarone, diltiazem. Resume metoprolol long-acting tomorrow. Discontinue aspirin for now. 5. GERD: Infrequent symptoms per patient. PPI therapy.  Code Status: Full Code, confirmed. Family Communication: Discussed with daughter at bedside. Disposition Plan: Pending further evaluation and treatment.  Brendia Sacks, MD  [facility] Pager [phone] 12/10/2011, 1:26 PM

## 2011-12-10 NOTE — ED Notes (Signed)
Pt reports bright red/dark blood in stool since this am. States having lower back pain/mild abd pain. Denies nausea/vomiting. Hx of similar episode x 1 year ago with dx of diverticulitis. States had colonoscopy in 2011 and it was "clean".

## 2011-12-10 NOTE — ED Notes (Signed)
Admission MD Irene Limbo at bedside. Pt given sandwich, chips, drink to eat per Dr. Weldon Inches.

## 2011-12-11 LAB — BASIC METABOLIC PANEL
CO2: 28 mEq/L (ref 19–32)
Calcium: 9.3 mg/dL (ref 8.4–10.5)
Chloride: 98 mEq/L (ref 96–112)
Creatinine, Ser: 0.62 mg/dL (ref 0.50–1.10)
Glucose, Bld: 134 mg/dL — ABNORMAL HIGH (ref 70–99)
Sodium: 135 mEq/L (ref 135–145)

## 2011-12-11 LAB — CBC
HCT: 33.5 % — ABNORMAL LOW (ref 36.0–46.0)
HCT: 34.9 % — ABNORMAL LOW (ref 36.0–46.0)
Hemoglobin: 11.6 g/dL — ABNORMAL LOW (ref 12.0–15.0)
Hemoglobin: 11.8 g/dL — ABNORMAL LOW (ref 12.0–15.0)
MCH: 31.2 pg (ref 26.0–34.0)
MCH: 30.9 pg (ref 26.0–34.0)
MCV: 94.2 fL (ref 78.0–100.0)
MCV: 94.1 fL (ref 78.0–100.0)
Platelets: 289 10*3/uL (ref 150–400)
RBC: 3.78 MIL/uL — ABNORMAL LOW (ref 3.87–5.11)
RBC: 3.56 MIL/uL — ABNORMAL LOW (ref 3.87–5.11)
WBC: 7.1 10*3/uL (ref 4.0–10.5)
WBC: 7.2 10*3/uL (ref 4.0–10.5)
WBC: 7.7 10*3/uL (ref 4.0–10.5)

## 2011-12-11 MED ORDER — POTASSIUM CHLORIDE CRYS ER 20 MEQ PO TBCR
40.0000 meq | EXTENDED_RELEASE_TABLET | Freq: Once | ORAL | Status: AC
  Administered 2011-12-11: 40 meq via ORAL
  Filled 2011-12-11: qty 2

## 2011-12-11 MED ORDER — PANTOPRAZOLE SODIUM 40 MG PO TBEC
40.0000 mg | DELAYED_RELEASE_TABLET | Freq: Every day | ORAL | Status: DC
  Administered 2011-12-11: 40 mg via ORAL
  Filled 2011-12-11: qty 1

## 2011-12-11 MED ORDER — PANTOPRAZOLE SODIUM 40 MG IV SOLR
40.0000 mg | Freq: Every day | INTRAVENOUS | Status: DC
  Filled 2011-12-11: qty 40

## 2011-12-11 MED ORDER — ALUM & MAG HYDROXIDE-SIMETH 200-200-20 MG/5ML PO SUSP: 30.0000 mL | Freq: Four times a day (QID) | ORAL | Status: AC | PRN

## 2011-12-11 NOTE — Discharge Summary (Signed)
Physician Discharge Summary  Patient ID: [name] MRN: [id number] DOB/AGE: Jul 03, 1926 78 y.o.  Admit date: 12/10/2011 Discharge date: 12/11/2011  Primary Care Physician:  [name], MD, MD   Discharge Diagnoses:    Present on Admission:  .GIB (gastrointestinal bleeding) .Diverticulosis of colon with hemorrhage .Hyperglycemia .A-fib  Medication List  As of 12/11/2011  2:24 PM   STOP taking these medications         aspirin 81 MG tablet         TAKE these medications         alum & mag hydroxide-simeth 200-200-20 MG/5ML suspension   Commonly known as: MAALOX/MYLANTA   Take 30 mLs by mouth every 6 (six) hours as needed (dyspepsia).      amiodarone 200 MG tablet   Commonly known as: PACERONE   Take 200 mg by mouth daily. 1/2 tab daily      CALCIUM 600 + D PO   Take 1 tablet by mouth daily.      cholecalciferol 1000 UNITS tablet   Commonly known as: VITAMIN D   Take 1,000 Units by mouth daily.      diltiazem 240 MG 24 hr capsule   Commonly known as: DILACOR XR   Take 240 mg by mouth daily.      docusate sodium 100 MG capsule   Commonly known as: COLACE   Take 100 mg by mouth 2 (two) times daily.      esomeprazole 40 MG capsule   Commonly known as: NEXIUM   Take 40 mg by mouth daily before breakfast. Patient only takes when needed      furosemide 40 MG tablet   Commonly known as: LASIX   Take 40 mg by mouth daily.      HYDROcodone-acetaminophen 7.5-325 MG per tablet   Commonly known as: NORCO   Take 1 tablet by mouth every 6 (six) hours as needed. 1-2 tabs q6 prn pain      LOSARTAN POTASSIUM PO   Take 100 mg by mouth daily.      metoprolol 200 MG 24 hr tablet   Commonly known as: TOPROL-XL   Take 200 mg by mouth daily.      multivitamin capsule   Take 1 capsule by mouth daily.      omega-3 acid ethyl esters 1 G capsule   Commonly known as: LOVAZA   Take 2 g by mouth daily.      SYNTHROID 50 MCG tablet   Generic drug: levothyroxine   Take 50 mcg by mouth daily. 1/2 tab qd             Disposition and Follow-up:  Pt is medically stable and ready for discharge to home with daughter. Will need to see Dr. Clelia Croft in 10 days for follow up .  Consults:  None   Physical Exam: see note dated 12/01/11  Significant Diagnostic Studies:  No results found.  Labs Reviewed  BASIC METABOLIC PANEL - Abnormal; Notable for the following:    Sodium 134 (*)    Glucose, Bld 158 (*)    GFR calc non Af Amer 80 (*)    All other components within normal limits  CBC - Abnormal; Notable for the following:    RBC 3.49 (*)    Hemoglobin 11.0 (*)    HCT 33.1 (*)    All other components within normal limits  CBC - Abnormal; Notable for the following:    RBC 3.68 (*)    Hemoglobin 11.6 (*)  HCT 34.9 (*)    All other components within normal limits  CBC - Abnormal; Notable for the following:    RBC 3.78 (*)    Hemoglobin 11.8 (*)    HCT 35.6 (*)    All other components within normal limits  BASIC METABOLIC PANEL - Abnormal; Notable for the following:    Potassium 3.2 (*)    Glucose, Bld 134 (*)    GFR calc non Af Amer 81 (*)    All other components within normal limits  CBC - Abnormal; Notable for the following:    RBC 3.56 (*)    Hemoglobin 11.0 (*)    HCT 33.5 (*)    All other components within normal limits  CBC  TYPE AND SCREEN  ABO/RH  CBC        No results found.     Brief H and P: For complete details please refer to admission H and P, but in brief   78 year old woman presented to the emergency department with painless rectal bleeding (4-5 episodes today). No fever, abdominal pain, diarrhea, nausea, vomiting, hematuria, headaches. Patient takes aspirin but no other antiplatelet agents. No anticoagulants. Per emergency department physician discussed the case with Dr. Laural Benes. He recommended admission but formal consultation was deferred.  The patient reports being in her usual state of health until she had  bleeding today.  Admitted July 2011, discharge diagnoses: Lower GI bleed (while on warfarin). Atrial fibrillation, warfarin on hold. No endoscopy performed at that time. Colonoscopy November 2010: Per Dr. Marlane Hatcher consults: Has had multiple colonoscopies by Dr. Laural Benes, the last one being in November 2010 which showed diverticula but no polyps, although the prep was suboptimal. She also had an upper endoscopy at that time which did not show any abnormalities per Dr. Ewing Schlein.       Hospital Course:  No resolved problems to display.  Active Hospital Problems  Diagnoses Date Noted   . GIB (gastrointestinal bleeding) 12/10/2011   . Diverticulosis of colon with hemorrhage 12/10/2011   . Hyperglycemia 12/10/2011   . A-fib 12/10/2011     Resolved Hospital Problems  Diagnoses Date Noted Date Resolved  1.  GI bleed: Presumably lower. Presumably diverticular in nature. Pt admitted to tele. Aspirin held. Serial CBC and clear liquid diet. HG range 11.0-12.3 . Hemodynamically stable. One moderate sized  bloody stools. .  Diet advanced this am and pt had additional stool. Last Hg 11.oTransition protonix to po. Colonoscopy would probably be very low yield in this patient. Dr. Ewing Schlein brifly consulted at time of admission and agreed with plan but no formal consult placed.  Pt will be discharged and will need to follow up with PCP in 5 days  2. Hyperglycemia: Random. No hx. Glucose 134. OP follow up 3. Hypokalemia: mild. Will replete. Will need OP follow up 4. Hypertension: Stable. Continue antihypertensives. 5. History of sick sinus syndrome/atrial fibrillation: S/P pacemaker. Not a Coumadin candidate secondary to history of GI bleed. Continue amiodarone, diltiazem. Will resume metoprolol long-acting . Discontinue aspirin for now. Follow up with PCP regarding when to resume 6. GERD: Infrequent symptoms     Time spent on Discharge: Less 30 minutes  Signed: [name] 12/11/2011, 2:24 PM

## 2011-12-11 NOTE — Discharge Summary (Addendum)
Patient seen and examined. Chart reviewed. Agree with Toya Smothers, NP.  Agree with above.  Hemoglobin stable last hemoglobin was 11.0.  Patient was instructed to followup with Dr. Danise Edge patient's primary gastroenterologist and primary care physician in 1 week.  Patient was tolerating solid food prior to discharge. Patient was given explicit instructions that if she has any further bleeding she is to come to the emergency department or to go to her primary care physician's office or her gastroenterologist for further care and management.  Jennylee Uehara A, MD 12/11/2011, 3:29 PM

## 2011-12-11 NOTE — Progress Notes (Signed)
Subjective: "I'm much better, no more bloody stool, i would like some food and i would like to go home today". Denies pain/nausea/discomfort  Objective: Vital signs Filed Vitals:   12/10/11 1636 12/10/11 1639 12/10/11 2102 12/11/11 0549  BP: 136/75  151/69 155/72  Pulse: 60  67 60  Temp: 98.1 F (36.7 C)  97.8 F (36.6 C) 98.4 F (36.9 C)  TempSrc: Oral  Oral Oral  Resp: 18  18 18   Height: 5\' 4"  (1.626 m)     Weight:  60.601 kg (133 lb 9.6 oz)  59.3 kg (130 lb 11.7 oz)  SpO2: 97%  92% 96%   Weight change:  Last BM Date: 12/10/11  Intake/Output from previous day: 01/28 0701 - 01/29 0700 In: 525 [I.V.:525] Out: 550 [Urine:550]     Physical Exam: General: Alert, awake, oriented x3, in no acute distress. Sitting up in bed eating liquid breakfast.  HEENT: No bruits, no goiter. PERRL mucus membranes moist/pink Heart: Regular rate and rhythm, without murmurs, rubs, gallops. Lungs: Normal effort. Clear to auscultation bilaterally. No wheeze, rhonchi Abdomen: Soft, nontender, nondistended, positive bowel sounds. Extremities: No clubbing cyanosis  with positive pedal pulses. Trace-1+LEE with tenderness to palpation particularly over ankles. Skin around ankles with venous changes. Neuro: Grossly intact, nonfocal. Speech clear.     Lab Results: Basic Metabolic Panel:  Basename 12/11/11 0418 12/10/11 1209  NA 135 134*  K 3.2* 3.8  CL 98 97  CO2 28 28  GLUCOSE 134* 158*  BUN 10 11  CREATININE 0.62 0.65  CALCIUM 9.3 9.7  MG -- --  PHOS -- --   Liver Function Tests: No results found for this basename: AST:2,ALT:2,ALKPHOS:2,BILITOT:2,PROT:2,ALBUMIN:2 in the last 72 hours No results found for this basename: LIPASE:2,AMYLASE:2 in the last 72 hours No results found for this basename: AMMONIA:2 in the last 72 hours CBC:  Basename 12/11/11 0418 12/11/11 0029  WBC 7.7 7.1  NEUTROABS -- --  HGB 11.8* 11.6*  HCT 35.6* 34.9*  MCV 94.2 94.8  PLT 305 279   Cardiac  Enzymes: No results found for this basename: CKTOTAL:3,CKMB:3,CKMBINDEX:3,TROPONINI:3 in the last 72 hours BNP: No results found for this basename: PROBNP:3 in the last 72 hours D-Dimer: No results found for this basename: DDIMER:2 in the last 72 hours CBG: No results found for this basename: GLUCAP:6 in the last 72 hours Hemoglobin A1C: No results found for this basename: HGBA1C in the last 72 hours Fasting Lipid Panel: No results found for this basename: CHOL,HDL,LDLCALC,TRIG,CHOLHDL,LDLDIRECT in the last 72 hours Thyroid Function Tests: No results found for this basename: TSH,T4TOTAL,FREET4,T3FREE,THYROIDAB in the last 72 hours Anemia Panel: No results found for this basename: VITAMINB12,FOLATE,FERRITIN,TIBC,IRON,RETICCTPCT in the last 72 hours Coagulation: No results found for this basename: LABPROT:2,INR:2 in the last 72 hours Urine Drug Screen: Drugs of Abuse  No results found for this basename: labopia, cocainscrnur, labbenz, amphetmu, thcu, labbarb    Alcohol Level: No results found for this basename: ETH:2 in the last 72 hours Urinalysis: Misc. Labs:  No results found for this or any previous visit (from the past 240 hour(s)).  Studies/Results: No results found.  Medications: Scheduled Meds:   . amiodarone  200 mg Oral Daily  . diltiazem  240 mg Oral Daily  . furosemide  40 mg Oral Daily  . levothyroxine  50 mcg Oral QAC breakfast  . pantoprazole  40 mg Oral Q1200  . DISCONTD: pantoprazole (PROTONIX) IV  40 mg Intravenous Q24H  . DISCONTD: pantoprazole (PROTONIX) IV  40 mg  Intravenous Daily   Continuous Infusions:   . DISCONTD: sodium chloride 100 mL (12/10/11 1214)  . DISCONTD: sodium chloride 75 mL/hr at 12/11/11 0113   PRN Meds:.acetaminophen, acetaminophen, alum & mag hydroxide-simeth, ondansetron (ZOFRAN) IV, ondansetron, zolpidem  Assessment/Plan:  1. GI bleed: Presumably lower. Presumably diverticular in nature. Hold aspirin. Patient has been typed  and screened. Serial CBC since noon yesterday yield HG range 11.0-12.3 . Hemodynamically stable. No further bloody stools. Has had 1 BM since on clear liquids. Will advance diet. Transition protonix to po. Colonoscopy would probably be very low yield in this patient.  Dr. Ewing Schlein brifly consulted at time of admission and agreed with plan but no formal consult placed. Will continue to monitor and if no further bleeding will consider discharge this afternoon.   2. Hyperglycemia: Random. No hx. Glucose 134. OP follow up 3. Hypokalemia: mild. Will replete. Will need OP follow up 4. Hypertension: Stable. Continue antihypertensives. 5. History of sick sinus syndrome/atrial fibrillation: S/P pacemaker. Not a Coumadin candidate secondary to history of GI bleed. Continue amiodarone, diltiazem. Consider resuming metoprolol long-acting . Discontinue aspirin for now. 6. GERD: Infrequent symptoms per patient. PPI therapy.      LOS: 1 day   [facility] M 12/11/2011, 8:38 AM

## 2011-12-11 NOTE — Progress Notes (Signed)
Patient seen and examined. Chart reviewed. Agree with Toya Smothers, NP.  Reports having had a bowel movement today no overt blood but blood was noted when she wiped.  Hemoglobin stable most recent hemoglobin was 11.0.  Patient was instructed to followup with Dr. Danise Edge patient's primary gastroenterologist and primary care physician in 1 week.  Patient was tolerating solid food prior to discharge.  Katalia Choma A, MD 12/11/2011, 3:27 PM

## 2012-02-13 ENCOUNTER — Encounter (HOSPITAL_BASED_OUTPATIENT_CLINIC_OR_DEPARTMENT_OTHER): Payer: Medicare Other | Attending: Plastic Surgery

## 2012-02-13 DIAGNOSIS — I83893 Varicose veins of bilateral lower extremities with other complications: Secondary | ICD-10-CM | POA: Insufficient documentation

## 2012-02-13 DIAGNOSIS — Z79899 Other long term (current) drug therapy: Secondary | ICD-10-CM | POA: Insufficient documentation

## 2012-02-13 DIAGNOSIS — I872 Venous insufficiency (chronic) (peripheral): Secondary | ICD-10-CM | POA: Insufficient documentation

## 2012-02-13 DIAGNOSIS — Z950 Presence of cardiac pacemaker: Secondary | ICD-10-CM | POA: Insufficient documentation

## 2012-02-13 DIAGNOSIS — I10 Essential (primary) hypertension: Secondary | ICD-10-CM | POA: Insufficient documentation

## 2012-02-14 NOTE — Progress Notes (Signed)
[facility]  NAME:  [name], [name]               ACCOUNT NO.:  [id number]  MEDICAL RECORD NO.:  [id number]      DATE OF BIRTH:  17-Apr-1926  PHYSICIAN:  Wayland Denis, DO       VISIT DATE:  02/13/2012                                  OFFICE VISIT   The patient is an 78 year old female who is here for evaluation of bilateral lower extremity chronic venous insufficiency.  She does not have any wounds at present, but has very dry, red skin with areas that are healing from where they had been opened.  She has multiple medical conditions, nothing seems to make her legs better.  Her Lasix has helped to improve the swelling in her lower extremities, and therefore the wounds as well.  Certainly, they are worse with fluid retention and the dependence of the legs.  PAST MEDICAL HISTORY:  Positive for thyroid disease, irregular heart rate with atrial fibrillation, diverticular bleed, and hypertension.  MEDICATIONS:  Lasix, losartan, Synthroid, hydrocodone, Pacerone, diltiazem, _________, multivitamin, vitamin D, calcium, and fish oil.  ALLERGIES:  ASPIRIN, SULFA, CODEINE, ACE INHIBITORS, TRAMADOL, and ATORVASTATIN.  SOCIAL HISTORY:  The patient lives at home.  She is not a smoker.  REVIEW OF SYSTEMS:  She had a recent GI bleed and was taken off the Coumadin and aspirin, and has a pacemaker.  PHYSICAL EXAMINATION:  GENERAL:  She is alert, oriented, cooperative, not in any acute distress.  She is pleasant. HEENT:  Pupils equal.  Extraocular muscles are intact. NECK:  No cervical lymphadenopathy. LUNGS:  Breathing is unlabored. CARDIOVASCULAR:  Her heart rate is regular. ABDOMEN:  Soft, nontender. EXTREMITIES:  Her lower extremities are noted above.  She does have very good pulses, but very bad venous insufficiency with varicose veins.  I recommend TCA, zinc, bilateral Unna boots, Kerlix, Coban, and follow up in 1 week.  She will need to be very good about  compression hose and pressure stockings to prevent recurrence.     Wayland Denis, DO     CS/MEDQ  D:  02/13/2012  T:  02/14/2012  Job:  [id number]

## 2012-02-20 ENCOUNTER — Other Ambulatory Visit: Payer: Self-pay

## 2012-02-20 ENCOUNTER — Encounter (INDEPENDENT_AMBULATORY_CARE_PROVIDER_SITE_OTHER): Payer: Medicare Other | Admitting: *Deleted

## 2012-02-20 ENCOUNTER — Ambulatory Visit (INDEPENDENT_AMBULATORY_CARE_PROVIDER_SITE_OTHER): Payer: Medicare Other | Admitting: *Deleted

## 2012-02-20 DIAGNOSIS — L97909 Non-pressure chronic ulcer of unspecified part of unspecified lower leg with unspecified severity: Secondary | ICD-10-CM

## 2012-02-20 DIAGNOSIS — I739 Peripheral vascular disease, unspecified: Secondary | ICD-10-CM

## 2012-02-20 DIAGNOSIS — R600 Localized edema: Secondary | ICD-10-CM

## 2012-02-20 DIAGNOSIS — I83009 Varicose veins of unspecified lower extremity with ulcer of unspecified site: Secondary | ICD-10-CM

## 2012-03-04 NOTE — Procedures (Unsigned)
DUPLEX DEEP VENOUS EXAM - LOWER EXTREMITY  INDICATION:  Bilateral lower extremity ulcers.  HISTORY:  Edema:  Yes. Trauma/Surgery:  No. Pain:  Yes. PE:  No. Previous DVT:  No. Anticoagulants:  No. Other:  Lasix.  DUPLEX EXAM:               CFV   SFV   PopV  PTV    GSV               R  L  R  L  R  L  R   L  R  L Thrombosis    0  0  0  0  0  0  0   0  0  0 Spontaneous   +  +  +  +  +  +  +   +  +  + Phasic        +  +  +  +  +  +  +   +  +  + Augmentation  +  +  +  +  +  +  +   +  +  + Compressible  +  +  +  +  +  +  +   +  +  + Competent     0  0  0  +  +  +  +   +  0  0  Legend:  + - yes  o - no  p - partial  D - decreased  IMPRESSION: 1. All deep veins of the bilateral lower extremities are compressible     with no evidence of acute deep vein thrombosis. 2. Significant reflux bilaterally in the greater saphenous veins and     right short saphenous vein.   _____________________________ Quita Skye. Hart Rochester, M.D.  SS/MEDQ  D:  02/20/2012  T:  02/20/2012  Job:  [id number]

## 2012-03-12 ENCOUNTER — Encounter (HOSPITAL_BASED_OUTPATIENT_CLINIC_OR_DEPARTMENT_OTHER): Payer: Medicare Other | Attending: Plastic Surgery

## 2012-03-12 DIAGNOSIS — Z79899 Other long term (current) drug therapy: Secondary | ICD-10-CM | POA: Insufficient documentation

## 2012-03-12 DIAGNOSIS — I872 Venous insufficiency (chronic) (peripheral): Secondary | ICD-10-CM | POA: Insufficient documentation

## 2012-03-12 DIAGNOSIS — Z950 Presence of cardiac pacemaker: Secondary | ICD-10-CM | POA: Insufficient documentation

## 2012-03-12 DIAGNOSIS — I10 Essential (primary) hypertension: Secondary | ICD-10-CM | POA: Insufficient documentation

## 2012-03-12 DIAGNOSIS — I83893 Varicose veins of bilateral lower extremities with other complications: Secondary | ICD-10-CM | POA: Insufficient documentation

## 2012-04-15 ENCOUNTER — Encounter: Payer: Self-pay | Admitting: Oncology

## 2012-04-15 ENCOUNTER — Telehealth: Payer: Self-pay | Admitting: Oncology

## 2012-04-15 ENCOUNTER — Other Ambulatory Visit: Payer: Self-pay | Admitting: Oncology

## 2012-04-15 DIAGNOSIS — C50919 Malignant neoplasm of unspecified site of unspecified female breast: Secondary | ICD-10-CM

## 2012-04-15 NOTE — Progress Notes (Unsigned)
Error- see phone call

## 2012-04-15 NOTE — Telephone Encounter (Signed)
I called the patient to check on her and she stated she is feeling better than she has been.   I gave her an appt to come back in June 11 for a follow up with Dr. Donnie Coffin and she verbalized understanding.

## 2012-04-15 NOTE — Telephone Encounter (Signed)
S/w the pt and she is aware of her June 14th appts

## 2012-04-22 ENCOUNTER — Other Ambulatory Visit: Payer: Medicare Other | Admitting: Lab

## 2012-04-22 ENCOUNTER — Ambulatory Visit: Payer: Medicare Other | Admitting: Oncology

## 2012-04-25 ENCOUNTER — Other Ambulatory Visit: Payer: Medicare Other | Admitting: Lab

## 2012-04-25 ENCOUNTER — Ambulatory Visit: Payer: Medicare Other | Admitting: Oncology

## 2012-05-28 ENCOUNTER — Other Ambulatory Visit: Payer: Self-pay | Admitting: Dermatology

## 2013-08-21 ENCOUNTER — Inpatient Hospital Stay (HOSPITAL_COMMUNITY)
Admission: EM | Admit: 2013-08-21 | Discharge: 2013-08-28 | DRG: 470 | Disposition: A | Discharge status: Skilled Nursing Facility | Payer: Medicare Other | Attending: Internal Medicine | Admitting: Internal Medicine

## 2013-08-21 ENCOUNTER — Encounter (HOSPITAL_COMMUNITY): Payer: Self-pay | Admitting: Emergency Medicine

## 2013-08-21 ENCOUNTER — Emergency Department (HOSPITAL_COMMUNITY): Payer: Medicare Other

## 2013-08-21 DIAGNOSIS — R739 Hyperglycemia, unspecified: Secondary | ICD-10-CM

## 2013-08-21 DIAGNOSIS — T40605A Adverse effect of unspecified narcotics, initial encounter: Secondary | ICD-10-CM | POA: Diagnosis not present

## 2013-08-21 DIAGNOSIS — I509 Heart failure, unspecified: Secondary | ICD-10-CM | POA: Diagnosis present

## 2013-08-21 DIAGNOSIS — E785 Hyperlipidemia, unspecified: Secondary | ICD-10-CM | POA: Diagnosis present

## 2013-08-21 DIAGNOSIS — I482 Chronic atrial fibrillation, unspecified: Secondary | ICD-10-CM | POA: Diagnosis present

## 2013-08-21 DIAGNOSIS — E871 Hypo-osmolality and hyponatremia: Secondary | ICD-10-CM | POA: Diagnosis present

## 2013-08-21 DIAGNOSIS — N6009 Solitary cyst of unspecified breast: Secondary | ICD-10-CM | POA: Diagnosis present

## 2013-08-21 DIAGNOSIS — E876 Hypokalemia: Secondary | ICD-10-CM | POA: Diagnosis not present

## 2013-08-21 DIAGNOSIS — I5032 Chronic diastolic (congestive) heart failure: Secondary | ICD-10-CM | POA: Diagnosis present

## 2013-08-21 DIAGNOSIS — I10 Essential (primary) hypertension: Secondary | ICD-10-CM | POA: Diagnosis present

## 2013-08-21 DIAGNOSIS — K922 Gastrointestinal hemorrhage, unspecified: Secondary | ICD-10-CM | POA: Diagnosis present

## 2013-08-21 DIAGNOSIS — I5030 Unspecified diastolic (congestive) heart failure: Secondary | ICD-10-CM

## 2013-08-21 DIAGNOSIS — Z8679 Personal history of other diseases of the circulatory system: Secondary | ICD-10-CM

## 2013-08-21 DIAGNOSIS — K5900 Constipation, unspecified: Secondary | ICD-10-CM

## 2013-08-21 DIAGNOSIS — M810 Age-related osteoporosis without current pathological fracture: Secondary | ICD-10-CM | POA: Diagnosis present

## 2013-08-21 DIAGNOSIS — N6001 Solitary cyst of right breast: Secondary | ICD-10-CM

## 2013-08-21 DIAGNOSIS — Z950 Presence of cardiac pacemaker: Secondary | ICD-10-CM

## 2013-08-21 DIAGNOSIS — R110 Nausea: Secondary | ICD-10-CM | POA: Diagnosis not present

## 2013-08-21 DIAGNOSIS — W19XXXA Unspecified fall, initial encounter: Secondary | ICD-10-CM | POA: Diagnosis present

## 2013-08-21 DIAGNOSIS — K5731 Diverticulosis of large intestine without perforation or abscess with bleeding: Secondary | ICD-10-CM | POA: Diagnosis present

## 2013-08-21 DIAGNOSIS — I4891 Unspecified atrial fibrillation: Secondary | ICD-10-CM | POA: Diagnosis present

## 2013-08-21 DIAGNOSIS — E039 Hypothyroidism, unspecified: Secondary | ICD-10-CM | POA: Diagnosis present

## 2013-08-21 DIAGNOSIS — K5730 Diverticulosis of large intestine without perforation or abscess without bleeding: Secondary | ICD-10-CM | POA: Diagnosis present

## 2013-08-21 DIAGNOSIS — Y9289 Other specified places as the place of occurrence of the external cause: Secondary | ICD-10-CM

## 2013-08-21 DIAGNOSIS — S72009A Fracture of unspecified part of neck of unspecified femur, initial encounter for closed fracture: Principal | ICD-10-CM | POA: Diagnosis present

## 2013-08-21 DIAGNOSIS — S72002A Fracture of unspecified part of neck of left femur, initial encounter for closed fracture: Secondary | ICD-10-CM

## 2013-08-21 LAB — URINALYSIS, ROUTINE W REFLEX MICROSCOPIC
Bilirubin Urine: NEGATIVE
Ketones, ur: NEGATIVE mg/dL
Leukocytes, UA: NEGATIVE
Nitrite: NEGATIVE
Protein, ur: NEGATIVE mg/dL

## 2013-08-21 LAB — BASIC METABOLIC PANEL
GFR calc Af Amer: 87 mL/min — ABNORMAL LOW (ref >90)
GFR calc non Af Amer: 75 mL/min — ABNORMAL LOW (ref >90)
Potassium: 3.6 mEq/L (ref 3.5–5.1)
Sodium: 125 mEq/L — ABNORMAL LOW (ref 135–145)

## 2013-08-21 LAB — URINE MICROSCOPIC-ADD ON

## 2013-08-21 LAB — CBC WITH DIFFERENTIAL/PLATELET
Basophils Absolute: 0 10*3/uL (ref 0.0–0.1)
Basophils Relative: 0 % (ref 0–1)
Eosinophils Absolute: 0.1 10*3/uL (ref 0.0–0.7)
Lymphs Abs: 1.4 10*3/uL (ref 0.7–4.0)
MCH: 30.7 pg (ref 26.0–34.0)
MCHC: 34.7 g/dL (ref 30.0–36.0)
Neutrophils Relative %: 77 % (ref 43–77)
Platelets: 299 10*3/uL (ref 150–400)
RBC: 4.01 MIL/uL (ref 3.87–5.11)
RDW: 12.8 % (ref 11.5–15.5)

## 2013-08-21 LAB — PROTIME-INR
INR: 1.03 (ref 0.00–1.49)
Prothrombin Time: 13.3 seconds (ref 11.6–15.2)

## 2013-08-21 MED ORDER — PANTOPRAZOLE SODIUM 40 MG PO TBEC
80.0000 mg | DELAYED_RELEASE_TABLET | Freq: Every day | ORAL | Status: DC
  Administered 2013-08-22 – 2013-08-28 (×7): 80 mg via ORAL
  Filled 2013-08-21 (×7): qty 2

## 2013-08-21 MED ORDER — OMEGA-3-ACID ETHYL ESTERS 1 G PO CAPS
2.0000 g | ORAL_CAPSULE | Freq: Every day | ORAL | Status: DC
  Administered 2013-08-22 – 2013-08-28 (×7): 2 g via ORAL
  Filled 2013-08-21 (×8): qty 2

## 2013-08-21 MED ORDER — ZOLPIDEM TARTRATE 5 MG PO TABS
5.0000 mg | ORAL_TABLET | Freq: Every evening | ORAL | Status: DC | PRN
  Administered 2013-08-25 – 2013-08-27 (×2): 5 mg via ORAL
  Filled 2013-08-21 (×2): qty 1

## 2013-08-21 MED ORDER — SODIUM CHLORIDE 0.9 % IV SOLN
INTRAVENOUS | Status: DC
  Administered 2013-08-21 – 2013-08-27 (×8): via INTRAVENOUS

## 2013-08-21 MED ORDER — LEVOTHYROXINE SODIUM 50 MCG PO TABS: 50.0000 ug | ORAL_TABLET | Freq: Every day | ORAL | Status: DC

## 2013-08-21 MED ORDER — LEVOTHYROXINE SODIUM 25 MCG PO TABS
25.0000 ug | ORAL_TABLET | Freq: Every day | ORAL | Status: DC
  Administered 2013-08-22 – 2013-08-28 (×7): 25 ug via ORAL
  Filled 2013-08-21 (×8): qty 1

## 2013-08-21 MED ORDER — SODIUM CHLORIDE 0.9 % IV SOLN: INTRAVENOUS | Status: DC

## 2013-08-21 MED ORDER — ACETAMINOPHEN 650 MG RE SUPP: 650.0000 mg | Freq: Four times a day (QID) | RECTAL | Status: DC | PRN

## 2013-08-21 MED ORDER — ONDANSETRON HCL 4 MG/2ML IJ SOLN
4.0000 mg | Freq: Four times a day (QID) | INTRAMUSCULAR | Status: DC | PRN
  Administered 2013-08-21 – 2013-08-22 (×2): 4 mg via INTRAVENOUS
  Filled 2013-08-21 (×4): qty 2

## 2013-08-21 MED ORDER — CALCIUM CARBONATE ANTACID 500 MG PO CHEW
1.0000 | CHEWABLE_TABLET | Freq: Every day | ORAL | Status: DC | PRN
  Filled 2013-08-21: qty 1

## 2013-08-21 MED ORDER — FUROSEMIDE 40 MG PO TABS
40.0000 mg | ORAL_TABLET | Freq: Every day | ORAL | Status: DC
  Administered 2013-08-22 – 2013-08-28 (×7): 40 mg via ORAL
  Filled 2013-08-21 (×7): qty 1

## 2013-08-21 MED ORDER — ACETAMINOPHEN 325 MG PO TABS: 650.0000 mg | ORAL_TABLET | Freq: Four times a day (QID) | ORAL | Status: DC | PRN

## 2013-08-21 MED ORDER — BISACODYL 5 MG PO TBEC
5.0000 mg | DELAYED_RELEASE_TABLET | Freq: Every day | ORAL | Status: DC | PRN
  Filled 2013-08-21: qty 1

## 2013-08-21 MED ORDER — VITAMIN D3 25 MCG (1000 UNIT) PO TABS
1000.0000 [IU] | ORAL_TABLET | Freq: Every day | ORAL | Status: DC
  Administered 2013-08-22 – 2013-08-28 (×6): 1000 [IU] via ORAL
  Filled 2013-08-21 (×8): qty 1

## 2013-08-21 MED ORDER — ONDANSETRON HCL 4 MG/2ML IJ SOLN
4.0000 mg | Freq: Once | INTRAMUSCULAR | Status: AC
  Administered 2013-08-21: 4 mg via INTRAVENOUS
  Filled 2013-08-21: qty 2

## 2013-08-21 MED ORDER — FENTANYL CITRATE 0.05 MG/ML IJ SOLN
100.0000 ug | Freq: Once | INTRAMUSCULAR | Status: AC
  Administered 2013-08-21: 100 ug via INTRAVENOUS
  Filled 2013-08-21: qty 2

## 2013-08-21 MED ORDER — DILTIAZEM HCL ER 240 MG PO CP24
240.0000 mg | ORAL_CAPSULE | Freq: Every day | ORAL | Status: DC
  Administered 2013-08-22 – 2013-08-28 (×7): 240 mg via ORAL
  Filled 2013-08-21 (×7): qty 1

## 2013-08-21 MED ORDER — CALCIUM CARBONATE-VITAMIN D 600-400 MG-UNIT PO TABS: ORAL_TABLET | Freq: Every day | ORAL | Status: DC

## 2013-08-21 MED ORDER — SODIUM CHLORIDE 0.9 % IV SOLN
INTRAVENOUS | Status: DC
  Administered 2013-08-21: 16:00:00 via INTRAVENOUS

## 2013-08-21 MED ORDER — SENNA 8.6 MG PO TABS
1.0000 | ORAL_TABLET | Freq: Two times a day (BID) | ORAL | Status: DC
  Administered 2013-08-22 – 2013-08-27 (×11): 8.6 mg via ORAL
  Filled 2013-08-21 (×11): qty 1

## 2013-08-21 MED ORDER — OXYCODONE HCL 5 MG PO TABS
5.0000 mg | ORAL_TABLET | ORAL | Status: DC | PRN
  Administered 2013-08-21 – 2013-08-22 (×2): 5 mg via ORAL
  Filled 2013-08-21 (×2): qty 1

## 2013-08-21 MED ORDER — HYDROMORPHONE HCL PF 1 MG/ML IJ SOLN
1.0000 mg | INTRAMUSCULAR | Status: DC | PRN
  Administered 2013-08-21 – 2013-08-22 (×3): 1 mg via INTRAVENOUS
  Filled 2013-08-21 (×3): qty 1

## 2013-08-21 MED ORDER — CALCIUM CARBONATE-VITAMIN D 500-200 MG-UNIT PO TABS
1.0000 | ORAL_TABLET | Freq: Every day | ORAL | Status: DC
  Administered 2013-08-22 – 2013-08-28 (×6): 1 via ORAL
  Filled 2013-08-21 (×8): qty 1

## 2013-08-21 MED ORDER — ONDANSETRON HCL 4 MG PO TABS: 4.0000 mg | ORAL_TABLET | Freq: Four times a day (QID) | ORAL | Status: DC | PRN
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
## 2013-08-21 NOTE — ED Notes (Addendum)
pts pacemaker is not compatible with the merlin pacemaker in the ED. So rep had to be called. In process. [name] from [facility] will come in 30 min- 1 hour.
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
## 2013-08-21 NOTE — ED Notes (Signed)
Pt given 100 mcg fentanyl, O2 sats dropped to 70 %, pt placed on O2 via n/c, 5L per min. O2 up to 96%. O2
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
## 2013-08-21 NOTE — ED Notes (Signed)
Bed: ZO10 Expected date:  Expected time:  Means of arrival:  Comments: ems- fall, hit head, no LOC

## 2013-08-22 ENCOUNTER — Encounter (HOSPITAL_COMMUNITY): Payer: Self-pay | Admitting: Anesthesiology

## 2013-08-22 ENCOUNTER — Inpatient Hospital Stay (HOSPITAL_COMMUNITY): Payer: Medicare Other

## 2013-08-22 ENCOUNTER — Encounter (HOSPITAL_COMMUNITY): Payer: Medicare Other | Admitting: Anesthesiology

## 2013-08-22 ENCOUNTER — Inpatient Hospital Stay (HOSPITAL_COMMUNITY): Payer: Medicare Other | Admitting: Anesthesiology

## 2013-08-22 ENCOUNTER — Encounter (HOSPITAL_COMMUNITY)
Admission: EM | Disposition: A | Discharge status: Skilled Nursing Facility | Payer: Self-pay | Source: Home / Self Care | Attending: Internal Medicine

## 2013-08-22 HISTORY — PX: HIP ARTHROPLASTY: SHX981

## 2013-08-22 LAB — URINE CULTURE

## 2013-08-22 LAB — COMPREHENSIVE METABOLIC PANEL
ALT: 13 U/L (ref 0–35)
Albumin: 3.8 g/dL (ref 3.5–5.2)
Alkaline Phosphatase: 83 U/L (ref 39–117)
BUN: 9 mg/dL (ref 6–23)
Chloride: 88 mEq/L — ABNORMAL LOW (ref 96–112)
GFR calc Af Amer: >90 mL/min (ref >90)
Potassium: 3.4 mEq/L — ABNORMAL LOW (ref 3.5–5.1)
Sodium: 125 mEq/L — ABNORMAL LOW (ref 135–145)
Total Protein: 7.3 g/dL (ref 6.0–8.3)

## 2013-08-22 LAB — CBC WITH DIFFERENTIAL/PLATELET
Basophils Relative: 0 % (ref 0–1)
Eosinophils Absolute: 0.1 10*3/uL (ref 0.0–0.7)
HCT: 37.8 % (ref 36.0–46.0)
Hemoglobin: 13 g/dL (ref 12.0–15.0)
Lymphocytes Relative: 11 % — ABNORMAL LOW (ref 12–46)
Lymphs Abs: 1.1 10*3/uL (ref 0.7–4.0)
MCHC: 34.4 g/dL (ref 30.0–36.0)
Monocytes Absolute: 0.9 10*3/uL (ref 0.1–1.0)
Monocytes Relative: 8 % (ref 3–12)
Neutro Abs: 8.7 10*3/uL — ABNORMAL HIGH (ref 1.7–7.7)
Neutrophils Relative %: 81 % — ABNORMAL HIGH (ref 43–77)
Platelets: 287 10*3/uL (ref 150–400)
RBC: 4.29 MIL/uL (ref 3.87–5.11)

## 2013-08-22 LAB — T4, FREE: Free T4: 1.37 ng/dL (ref 0.80–1.80)

## 2013-08-22 LAB — TSH: TSH: 4.317 u[IU]/mL (ref 0.350–4.500)

## 2013-08-22 LAB — MAGNESIUM: Magnesium: 1.8 mg/dL (ref 1.5–2.5)

## 2013-08-22 SURGERY — HEMIARTHROPLASTY, HIP, DIRECT ANTERIOR APPROACH, FOR FRACTURE
Anesthesia: General | Site: Hip | Laterality: Left | Wound class: Clean

## 2013-08-22 MED ORDER — FENTANYL CITRATE 0.05 MG/ML IJ SOLN: 25.0000 ug | INTRAMUSCULAR | Status: DC | PRN

## 2013-08-22 MED ORDER — HYDROCODONE-ACETAMINOPHEN 5-325 MG PO TABS
1.0000 | ORAL_TABLET | Freq: Four times a day (QID) | ORAL | Status: DC | PRN
  Administered 2013-08-23 – 2013-08-24 (×6): 1 via ORAL
  Administered 2013-08-25: 2 via ORAL
  Filled 2013-08-22 (×6): qty 1
  Filled 2013-08-22: qty 2

## 2013-08-22 MED ORDER — DOCUSATE SODIUM 100 MG PO CAPS
100.0000 mg | ORAL_CAPSULE | Freq: Two times a day (BID) | ORAL | Status: DC
  Administered 2013-08-22 – 2013-08-28 (×12): 100 mg via ORAL
  Filled 2013-08-22 (×13): qty 1

## 2013-08-22 MED ORDER — SODIUM CHLORIDE 0.9 % IV SOLN: INTRAVENOUS | Status: DC

## 2013-08-22 MED ORDER — PROPOFOL 10 MG/ML IV BOLUS
INTRAVENOUS | Status: DC | PRN
  Administered 2013-08-22: 100 mg via INTRAVENOUS
  Administered 2013-08-22: 20 mg via INTRAVENOUS

## 2013-08-22 MED ORDER — LOSARTAN POTASSIUM 50 MG PO TABS
100.0000 mg | ORAL_TABLET | Freq: Every day | ORAL | Status: DC
  Administered 2013-08-22 – 2013-08-28 (×7): 100 mg via ORAL
  Filled 2013-08-22 (×7): qty 2

## 2013-08-22 MED ORDER — FLEET ENEMA 7-19 GM/118ML RE ENEM: 1.0000 | ENEMA | Freq: Once | RECTAL | Status: AC | PRN

## 2013-08-22 MED ORDER — ONDANSETRON HCL 4 MG/2ML IJ SOLN
4.0000 mg | Freq: Four times a day (QID) | INTRAMUSCULAR | Status: DC | PRN
  Administered 2013-08-24 – 2013-08-27 (×3): 4 mg via INTRAVENOUS
  Filled 2013-08-22: qty 2

## 2013-08-22 MED ORDER — FERROUS SULFATE 325 (65 FE) MG PO TABS
325.0000 mg | ORAL_TABLET | Freq: Three times a day (TID) | ORAL | Status: DC
  Administered 2013-08-22 – 2013-08-25 (×7): 325 mg via ORAL
  Filled 2013-08-22 (×20): qty 1

## 2013-08-22 MED ORDER — PHENOL 1.4 % MT LIQD: 1.0000 | OROMUCOSAL | Status: DC | PRN

## 2013-08-22 MED ORDER — ENOXAPARIN SODIUM 40 MG/0.4ML ~~LOC~~ SOLN
40.0000 mg | SUBCUTANEOUS | Status: DC
  Administered 2013-08-23 – 2013-08-28 (×6): 40 mg via SUBCUTANEOUS
  Filled 2013-08-22 (×7): qty 0.4

## 2013-08-22 MED ORDER — CEFAZOLIN SODIUM-DEXTROSE 2-3 GM-% IV SOLR
2.0000 g | INTRAVENOUS | Status: AC
  Administered 2013-08-22: 2 g via INTRAVENOUS

## 2013-08-22 MED ORDER — MENTHOL 3 MG MT LOZG: 1.0000 | LOZENGE | OROMUCOSAL | Status: DC | PRN

## 2013-08-22 MED ORDER — POLYETHYLENE GLYCOL 3350 17 G PO PACK
17.0000 g | PACK | Freq: Every day | ORAL | Status: DC | PRN
  Administered 2013-08-27: 17 g via ORAL
  Filled 2013-08-22: qty 1

## 2013-08-22 MED ORDER — METHOCARBAMOL 100 MG/ML IJ SOLN
500.0000 mg | Freq: Four times a day (QID) | INTRAVENOUS | Status: DC | PRN
  Filled 2013-08-22: qty 5

## 2013-08-22 MED ORDER — ONDANSETRON HCL 4 MG/2ML IJ SOLN
INTRAMUSCULAR | Status: DC | PRN
  Administered 2013-08-22: 4 mg via INTRAMUSCULAR

## 2013-08-22 MED ORDER — POTASSIUM CHLORIDE 10 MEQ/100ML IV SOLN
10.0000 meq | INTRAVENOUS | Status: AC
  Administered 2013-08-22 (×3): 10 meq via INTRAVENOUS
  Filled 2013-08-22 (×3): qty 100

## 2013-08-22 MED ORDER — PROMETHAZINE HCL 25 MG/ML IJ SOLN: 6.2500 mg | INTRAMUSCULAR | Status: DC | PRN

## 2013-08-22 MED ORDER — SUCCINYLCHOLINE CHLORIDE 20 MG/ML IJ SOLN
INTRAMUSCULAR | Status: DC | PRN
  Administered 2013-08-22: 100 mg via INTRAVENOUS

## 2013-08-22 MED ORDER — CEFAZOLIN SODIUM-DEXTROSE 2-3 GM-% IV SOLR
2.0000 g | Freq: Four times a day (QID) | INTRAVENOUS | Status: AC
  Administered 2013-08-22 – 2013-08-23 (×2): 2 g via INTRAVENOUS
  Filled 2013-08-22 (×3): qty 50

## 2013-08-22 MED ORDER — BISACODYL 10 MG RE SUPP
10.0000 mg | Freq: Every day | RECTAL | Status: DC | PRN
  Administered 2013-08-27: 10 mg via RECTAL
  Filled 2013-08-22: qty 1

## 2013-08-22 MED ORDER — SODIUM CHLORIDE 0.9 % IV SOLN
INTRAVENOUS | Status: DC | PRN
  Administered 2013-08-22: 13:00:00 via INTRAVENOUS

## 2013-08-22 MED ORDER — CEFAZOLIN SODIUM-DEXTROSE 2-3 GM-% IV SOLR
INTRAVENOUS | Status: AC
  Filled 2013-08-22: qty 50

## 2013-08-22 MED ORDER — METHOCARBAMOL 500 MG PO TABS
500.0000 mg | ORAL_TABLET | Freq: Four times a day (QID) | ORAL | Status: DC | PRN
  Administered 2013-08-23 – 2013-08-24 (×4): 500 mg via ORAL
  Filled 2013-08-22 (×4): qty 1

## 2013-08-22 MED ORDER — 0.9 % SODIUM CHLORIDE (POUR BTL) OPTIME
TOPICAL | Status: DC | PRN
  Administered 2013-08-22: 1000 mL

## 2013-08-22 MED ORDER — INFLUENZA VAC SPLIT QUAD 0.5 ML IM SUSP
0.5000 mL | INTRAMUSCULAR | Status: AC
  Administered 2013-08-23: 0.5 mL via INTRAMUSCULAR
  Filled 2013-08-22 (×2): qty 0.5

## 2013-08-22 MED ORDER — FENTANYL CITRATE 0.05 MG/ML IJ SOLN
INTRAMUSCULAR | Status: DC | PRN
  Administered 2013-08-22 (×2): 50 ug via INTRAVENOUS

## 2013-08-22 MED ORDER — HYDROMORPHONE HCL PF 1 MG/ML IJ SOLN
0.5000 mg | INTRAMUSCULAR | Status: DC | PRN
  Administered 2013-08-23: 0.5 mg via INTRAVENOUS
  Administered 2013-08-24 (×2): 1 mg via INTRAVENOUS
  Administered 2013-08-24: 0.5 mg via INTRAVENOUS
  Administered 2013-08-25: 1 mg via INTRAVENOUS
  Administered 2013-08-27 (×2): 0.5 mg via INTRAVENOUS
  Filled 2013-08-22 (×7): qty 1

## 2013-08-22 MED ORDER — HYDROMORPHONE HCL PF 1 MG/ML IJ SOLN: 0.2500 mg | INTRAMUSCULAR | Status: DC | PRN

## 2013-08-22 SURGICAL SUPPLY — 49 items
ADH SKN CLS APL DERMABOND .7 (GAUZE/BANDAGES/DRESSINGS) ×1
BAG SPEC THK2 15X12 ZIP CLS (MISCELLANEOUS) ×1
BAG ZIPLOCK 12X15 (MISCELLANEOUS) ×2 IMPLANT
BLADE SAW SGTL 18X1.27X75 (BLADE) ×2 IMPLANT
CAPT HIP FX BIPOLAR/UNIPOLAR ×1 IMPLANT
CLOTH BEACON ORANGE TIMEOUT ST (SAFETY) ×2 IMPLANT
DERMABOND ADVANCED (GAUZE/BANDAGES/DRESSINGS) ×1
DERMABOND ADVANCED .7 DNX12 (GAUZE/BANDAGES/DRESSINGS) ×1 IMPLANT
DRAPE INCISE IOBAN 85X60 (DRAPES) ×2 IMPLANT
DRAPE ORTHO SPLIT 77X108 STRL (DRAPES) ×4
DRAPE POUCH INSTRU U-SHP 10X18 (DRAPES) ×2 IMPLANT
DRAPE SURG 17X11 SM STRL (DRAPES) ×2 IMPLANT
DRAPE SURG ORHT 6 SPLT 77X108 (DRAPES) ×2 IMPLANT
DRAPE U-SHAPE 47X51 STRL (DRAPES) ×2 IMPLANT
DRSG AQUACEL AG ADV 3.5X10 (GAUZE/BANDAGES/DRESSINGS) ×2 IMPLANT
DRSG TEGADERM 4X4.75 (GAUZE/BANDAGES/DRESSINGS) ×1 IMPLANT
DURAPREP 26ML APPLICATOR (WOUND CARE) ×2 IMPLANT
ELECT BLADE TIP CTD 4 INCH (ELECTRODE) ×2 IMPLANT
ELECT REM PT RETURN 9FT ADLT (ELECTROSURGICAL) ×2
ELECTRODE REM PT RTRN 9FT ADLT (ELECTROSURGICAL) ×1 IMPLANT
EVACUATOR 1/8 PVC DRAIN (DRAIN) ×1 IMPLANT
FACESHIELD LNG OPTICON STERILE (SAFETY) ×7 IMPLANT
GAUZE SPONGE 2X2 8PLY STRL LF (GAUZE/BANDAGES/DRESSINGS) ×1 IMPLANT
GLOVE BIOGEL PI IND STRL 7.5 (GLOVE) ×1 IMPLANT
GLOVE BIOGEL PI IND STRL 8 (GLOVE) ×1 IMPLANT
GLOVE BIOGEL PI INDICATOR 7.5 (GLOVE) ×1
GLOVE BIOGEL PI INDICATOR 8 (GLOVE) ×1
GLOVE ECLIPSE 8.0 STRL XLNG CF (GLOVE) ×1 IMPLANT
GLOVE ORTHO TXT STRL SZ7.5 (GLOVE) ×4 IMPLANT
GLOVE SURG ORTHO 8.0 STRL STRW (GLOVE) ×2 IMPLANT
GOWN BRE IMP PREV XXLGXLNG (GOWN DISPOSABLE) ×4 IMPLANT
GOWN PREVENTION PLUS LG XLONG (DISPOSABLE) ×2 IMPLANT
HANDPIECE INTERPULSE COAX TIP (DISPOSABLE)
IMMOBILIZER KNEE 20 (SOFTGOODS) ×2
IMMOBILIZER KNEE 20 THIGH 36 (SOFTGOODS) IMPLANT
KIT BASIN OR (CUSTOM PROCEDURE TRAY) ×2 IMPLANT
MANIFOLD NEPTUNE II (INSTRUMENTS) ×2 IMPLANT
PACK TOTAL JOINT (CUSTOM PROCEDURE TRAY) ×2 IMPLANT
POSITIONER SURGICAL ARM (MISCELLANEOUS) ×2 IMPLANT
SET HNDPC FAN SPRY TIP SCT (DISPOSABLE) IMPLANT
SPONGE GAUZE 2X2 STER 10/PKG (GAUZE/BANDAGES/DRESSINGS) ×1
STRIP CLOSURE SKIN 1/2X4 (GAUZE/BANDAGES/DRESSINGS) ×4 IMPLANT
SUT ETHIBOND NAB CT1 #1 30IN (SUTURE) ×2 IMPLANT
SUT MNCRL AB 4-0 PS2 18 (SUTURE) ×2 IMPLANT
SUT VIC AB 1 CT1 36 (SUTURE) ×4 IMPLANT
SUT VIC AB 2-0 CT1 27 (SUTURE) ×4
SUT VIC AB 2-0 CT1 TAPERPNT 27 (SUTURE) ×2 IMPLANT
TOWEL OR 17X26 10 PK STRL BLUE (TOWEL DISPOSABLE) ×4 IMPLANT
TRAY FOLEY CATH 14FRSI W/METER (CATHETERS) ×2 IMPLANT

## 2013-08-22 NOTE — Progress Notes (Signed)
No order in for surgery, I paged and M. Babish-PA called back and stated he will be putting orders in. Continue to F/U with plan of care.

## 2013-08-22 NOTE — Anesthesia Preprocedure Evaluation (Addendum)
Anesthesia Evaluation  Patient identified by MRN, date of birth, ID band Patient awake  General Assessment Comment:  Hypertension     .  Hyperlipidemia     .  Allergy     .  Osteoporosis     .  History of gastroesophageal reflux (GERD)     .  Afib         No Coumadin secondary to history of GI bleed.   Marland Kitchen  History of sick sinus syndrome     .  Diverticulosis of colon with hemorrhage      Hospitalist note reviewed. .  Diastolic dysfunction     Reviewed: Allergy & Precautions, H&P , NPO status , Patient's Chart, lab work & pertinent test results  Airway Mallampati: II TM Distance: >3 FB Neck ROM: Full    Dental no notable dental hx.    Pulmonary neg pulmonary ROS,  CXR: stable mild chronic interstitial disease. breath sounds clear to auscultation  Pulmonary exam normal       Cardiovascular hypertension, Pt. on medications and Pt. on home beta blockers +CHF negative cardio ROS  + dysrhythmias Atrial Fibrillation + pacemaker Rhythm:Regular Rate:Normal  ECG reviewed: paced ventricular  ECHO 2009 OK EF normal.  St Jude pacemaker placed 2009 for sick sinus syndrome   Neuro/Psych negative neurological ROS  negative psych ROS   GI/Hepatic negative GI ROS, Neg liver ROS,   Endo/Other  negative endocrine ROS  Renal/GU negative Renal ROS  negative genitourinary   Musculoskeletal negative musculoskeletal ROS (+)   Abdominal   Peds negative pediatric ROS (+)  Hematology negative hematology ROS (+)   Anesthesia Other Findings   Reproductive/Obstetrics negative OB ROS                         Anesthesia Physical Anesthesia Plan  ASA: III and emergent  Anesthesia Plan: General   Post-op Pain Management:    Induction: Intravenous  Airway Management Planned: Oral ETT  Additional Equipment:   Intra-op Plan:   Post-operative Plan: Extubation in OR  Informed Consent: I have reviewed the  patients History and Physical, chart, labs and discussed the procedure including the risks, benefits and alternatives for the proposed anesthesia with the patient or authorized representative who has indicated his/her understanding and acceptance.   Dental advisory given  Plan Discussed with: CRNA  Anesthesia Plan Comments:         Anesthesia Quick Evaluation

## 2013-08-22 NOTE — Transfer of Care (Signed)
Immediate Anesthesia Transfer of Care Note  Patient: [name]  Procedure(s) Performed: Procedure(s): ARTHROPLASTY BIPOLAR HIP (Left)  Patient Location: PACU  Anesthesia Type:General  Level of Consciousness: awake, alert , oriented and patient cooperative  Airway & Oxygen Therapy: Patient Spontanous Breathing and Patient connected to face mask oxygen  Post-op Assessment: Report given to PACU RN and Post -op Vital signs reviewed and stable  Post vital signs: Reviewed and stable  Complications: No apparent anesthesia complications

## 2013-08-22 NOTE — Preoperative (Signed)
Beta Blockers   Reason not to administer Beta Blockers:Not Applicable 

## 2013-08-22 NOTE — Anesthesia Postprocedure Evaluation (Addendum)
  Anesthesia Post-op Note  Patient: [name]  Procedure(s) Performed: Procedure(s) (LRB): ARTHROPLASTY BIPOLAR HIP (Left)  Patient Location: PACU  Anesthesia Type: General  Level of Consciousness: awake and alert   Airway and Oxygen Therapy: Patient Spontanous Breathing  Post-op Pain: mild  Post-op Assessment: Post-op Vital signs reviewed, Patient's Cardiovascular Status Stable, Respiratory Function Stable, Patent Airway and No signs of Nausea or vomiting  Last Vitals:  Filed Vitals:   08/22/13 1420  BP: 167/56  Pulse: 60  Temp: 36.9 C  Resp: 10    Post-op Vital Signs: stable   Complications: No apparent anesthesia complications  No magnet applied to pacemaker. Paced rhythm in PACU.

## 2013-08-22 NOTE — Progress Notes (Signed)
Patient back from surgery arousable, denies any distress but pain on the left hip with activity, ice pack applied, surgical dressing clean/dry/intact. Reviewed plan of care with patient, family at the bedside.
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
## 2013-08-22 NOTE — Progress Notes (Signed)
79 yo female with left femoral neck fracture  Ortho consulted for definitive management Chart and history reviewed  NPO Consent ordered  To OR today for left hip hemiarthroplasty

## 2013-08-22 NOTE — Progress Notes (Signed)
RN called to see if OR had scheduled the surgery yet. No surgery scheduled yet. Will call OR again.
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
## 2013-08-23 DIAGNOSIS — E039 Hypothyroidism, unspecified: Secondary | ICD-10-CM | POA: Diagnosis present

## 2013-08-23 LAB — COMPREHENSIVE METABOLIC PANEL
ALT: 9 U/L (ref 0–35)
AST: 18 U/L (ref 0–37)
Albumin: 3.2 g/dL — ABNORMAL LOW (ref 3.5–5.2)
CO2: 26 mEq/L (ref 19–32)
Calcium: 9.1 mg/dL (ref 8.4–10.5)
Creatinine, Ser: 0.66 mg/dL (ref 0.50–1.10)
Sodium: 126 mEq/L — ABNORMAL LOW (ref 135–145)
Total Protein: 6.3 g/dL (ref 6.0–8.3)

## 2013-08-23 LAB — CBC
HCT: 32.8 % — ABNORMAL LOW (ref 36.0–46.0)
Hemoglobin: 11.4 g/dL — ABNORMAL LOW (ref 12.0–15.0)
MCH: 30.4 pg (ref 26.0–34.0)
MCV: 87.5 fL (ref 78.0–100.0)
RBC: 3.75 MIL/uL — ABNORMAL LOW (ref 3.87–5.11)

## 2013-08-23 LAB — MAGNESIUM: Magnesium: 1.5 mg/dL (ref 1.5–2.5)

## 2013-08-23 NOTE — Progress Notes (Signed)
[name]  MRN: [id number] DOB/Age: Nov 23, 1925 79 y.o. Physician: Jacquelyne Balint Procedure: Procedure(s) (LRB): ARTHROPLASTY BIPOLAR HIP (Left)     Subjective: Awake and alert. Sore in left hip but better than yesterday  Vital Signs Temp:  [97.5 F (36.4 C)-98.5 F (36.9 C)] 98 F (36.7 C) (10/12 0533) Pulse Rate:  [59-107] 67 (10/12 0533) Resp:  [10-20] 18 (10/12 0800) BP: (121-167)/(45-88) 145/54 mmHg (10/12 0533) SpO2:  [92 %-100 %] 97 % (10/12 0800) Weight:  [59.491 kg (131 lb 2.5 oz)] 59.491 kg (131 lb 2.5 oz) (10/12 0533)  Lab Results  Recent Labs  08/22/13 0415 08/23/13 0410  WBC 10.8* 10.8*  HGB 13.0 11.4*  HCT 37.8 32.8*  PLT 287 255   BMET  Recent Labs  08/22/13 0415 08/23/13 0410  NA 125* 126*  K 3.4* 3.4*  CL 88* 90*  CO2 28 26  GLUCOSE 127* 128*  BUN 9 11  CREATININE 0.51 0.66  CALCIUM 9.2 9.1   INR  Date Value Range Status  08/21/2013 1.03  0.00 - 1.49 Final     Exam Left hip dressing dry NVI        Plan PT/OT per protocol Care per medicine team  [facility] for Dr.Kevin Supple 08/23/2013, 9:54 AM
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
## 2013-08-23 NOTE — Evaluation (Signed)
Physical Therapy Evaluation Patient Details Name: [name] MRN: [id number] DOB: December 14, 1925 Today's Date: 08/23/2013 Time: 3086-5784 PT Time Calculation (min): 31 min  PT Assessment / Plan / Recommendation History of Present Illness  Patient is an 79 y/o s/p fall with FNF and hemiarthroplasty.  Clinical Impression  Patient presents with decreased independence due to problems listed below and will benefit from skilled PT in the acute setting to maximize independence and allow return home following SNF rehab stay.    PT Assessment  Patient needs continued PT services    Follow Up Recommendations  SNF    Does the patient have the potential to tolerate intense rehabilitation    N/A  Barriers to Discharge  Decreased caregiver support      Equipment Recommendations  Rolling walker with 5" wheels    Recommendations for Other Services   OT  Frequency Min 3X/week    Precautions / Restrictions Precautions Precautions: Fall;Posterior Hip Precaution Booklet Issued: Yes (comment) Restrictions LLE Weight Bearing: Weight bearing as tolerated   Pertinent Vitals/Pain 8/10 left LE RN delivered meds      Mobility  Bed Mobility Bed Mobility: Supine to Sit;Sitting - Scoot to Edge of Bed Supine to Sit: 1: +2 Total assist Supine to Sit: Patient Percentage: 20% Sitting - Scoot to Edge of Bed: 3: Mod assist Details for Bed Mobility Assistance: increased time and cues for participation Transfers Transfers: Sit to Stand;Stand to Sit;Stand Pivot Transfers Sit to Stand: From bed;1: +2 Total assist Sit to Stand: Patient Percentage: 30% Stand to Sit: To chair/3-in-1;1: +2 Total assist Stand to Sit: Patient Percentage: 30% Stand Pivot Transfers: 1: +2 Total assist Stand Pivot Transfers: Patient Percentage: 40% Details for Transfer Assistance: cues for sequence, weight bearing, assist for balance (pt fearful of putting weight on operated extremity)    Exercises Total Joint  Exercises Ankle Circles/Pumps: AROM;Both;10 reps;Supine Heel Slides: AAROM;Both;5 reps;Supine;10 reps   PT Diagnosis: Acute pain;Difficulty walking  PT Problem List: Decreased strength;Decreased range of motion;Decreased knowledge of use of DME;Decreased activity tolerance;Decreased balance;Decreased knowledge of precautions;Decreased mobility;Decreased coordination;Pain PT Treatment Interventions: DME instruction;Balance training;Gait training;Functional mobility training;Patient/family education;Therapeutic activities;Therapeutic exercise     PT Goals(Current goals can be found in the care plan section) Acute Rehab PT Goals Patient Stated Goal: To go to [facility] for rehab and return to independent PT Goal Formulation: With patient Time For Goal Achievement: 09/06/13 Potential to Achieve Goals: Good  Visit Information  Last PT Received On: 08/23/13 Assistance Needed: +2 History of Present Illness: Patient is an 79 y/o s/p fall with FNF and hemiarthroplasty.       Prior Functioning  Home Living Family/patient expects to be discharged to:: Skilled nursing facility (Hopes to go to [facility] in [place]) Additional Comments: Lives with son who works outside most of the day Prior Function Level of Independence: Independent Communication Communication: No difficulties    Copywriter, advertising Arousal/Alertness: Awake/alert Behavior During Therapy: Anxious;WFL for tasks assessed/performed Overall Cognitive Status: Within Functional Limits for tasks assessed    Extremity/Trunk Assessment Upper Extremity Assessment Upper Extremity Assessment: Overall WFL for tasks assessed Lower Extremity Assessment Lower Extremity Assessment: RLE deficits/detail;LLE deficits/detail RLE Deficits / Details: painful with AAROM hip flexion due to groin pain/soreness; but ROM overall WFL LLE Deficits / Details: PROM limited by pain   Balance Balance Balance Assessed: Yes Static Standing Balance Static  Standing - Balance Support: Bilateral upper extremity supported Static Standing - Level of Assistance: 1: +2 Total assist Static Standing - Comment/# of  Minutes: leaning back and limited weight on left LE  End of Session PT - End of Session Equipment Utilized During Treatment: Gait belt Activity Tolerance: Patient limited by pain Patient left: in chair;with call bell/phone within reach Nurse Communication: Mobility status  GP     [facility] 08/23/2013, 4:30 PM [place], [place] [phone] 08/23/2013

## 2013-08-24 DIAGNOSIS — I5030 Unspecified diastolic (congestive) heart failure: Secondary | ICD-10-CM

## 2013-08-24 DIAGNOSIS — I509 Heart failure, unspecified: Secondary | ICD-10-CM

## 2013-08-24 DIAGNOSIS — E876 Hypokalemia: Secondary | ICD-10-CM | POA: Diagnosis present

## 2013-08-24 DIAGNOSIS — R7309 Other abnormal glucose: Secondary | ICD-10-CM

## 2013-08-24 DIAGNOSIS — I5032 Chronic diastolic (congestive) heart failure: Secondary | ICD-10-CM | POA: Diagnosis present

## 2013-08-24 LAB — CBC
MCH: 30.4 pg (ref 26.0–34.0)
Platelets: 238 10*3/uL (ref 150–400)
RBC: 3.22 MIL/uL — ABNORMAL LOW (ref 3.87–5.11)
RDW: 12.8 % (ref 11.5–15.5)
WBC: 9.6 10*3/uL (ref 4.0–10.5)

## 2013-08-24 LAB — COMPREHENSIVE METABOLIC PANEL
CO2: 28 mEq/L (ref 19–32)
Calcium: 8.4 mg/dL (ref 8.4–10.5)
Chloride: 93 mEq/L — ABNORMAL LOW (ref 96–112)
Creatinine, Ser: 0.53 mg/dL (ref 0.50–1.10)
GFR calc Af Amer: >90 mL/min (ref >90)
GFR calc non Af Amer: 84 mL/min — ABNORMAL LOW (ref >90)
Glucose, Bld: 137 mg/dL — ABNORMAL HIGH (ref 70–99)
Total Bilirubin: 0.4 mg/dL (ref 0.3–1.2)

## 2013-08-24 LAB — MAGNESIUM
Magnesium: 1.7 mg/dL (ref 1.5–2.5)
Magnesium: 2.1 mg/dL (ref 1.5–2.5)

## 2013-08-24 MED ORDER — ENOXAPARIN SODIUM 40 MG/0.4ML ~~LOC~~ SOLN: 40.0000 mg | SUBCUTANEOUS | Status: DC

## 2013-08-24 MED ORDER — POTASSIUM CHLORIDE 10 MEQ/100ML IV SOLN
10.0000 meq | INTRAVENOUS | Status: AC
  Administered 2013-08-24 (×2): 10 meq via INTRAVENOUS
  Filled 2013-08-24 (×2): qty 100

## 2013-08-24 MED ORDER — MAGNESIUM SULFATE 40 MG/ML IJ SOLN
2.0000 g | Freq: Once | INTRAMUSCULAR | Status: AC
  Administered 2013-08-24: 2 g via INTRAVENOUS
  Filled 2013-08-24: qty 50

## 2013-08-24 MED ORDER — HYDROCODONE-ACETAMINOPHEN 5-325 MG PO TABS: 1.0000 | ORAL_TABLET | Freq: Four times a day (QID) | ORAL | Status: DC | PRN

## 2013-08-24 NOTE — Progress Notes (Signed)
Patient ID: [name], female   DOB: May 08, 1926, 79 y.o.   MRN: [id number] Subjective: 2 Days Post-Op Procedure(s) (LRB): ARTHROPLASTY BIPOLAR HIP (Left)    Patient reports pain as mild.  Tolerating her post-operative course well thus far, no events  Objective:   VITALS:   Filed Vitals:   08/24/13 0738  BP:   Pulse:   Temp:   Resp: 16    Neurovascular intact Incision: dressing C/D/I  LABS  Recent Labs  08/22/13 0415 08/23/13 0410 08/24/13 0505  HGB 13.0 11.4* 9.8*  HCT 37.8 32.8* 28.2*  WBC 10.8* 10.8* 9.6  PLT 287 255 238     Recent Labs  08/22/13 0415 08/23/13 0410 08/24/13 0505  NA 125* 126* 128*  K 3.4* 3.4* 3.0*  BUN 9 11 7   CREATININE 0.51 0.66 0.53  GLUCOSE 127* 128* 137*     Recent Labs  08/21/13 1620  INR 1.03     Assessment/Plan: 2 Days Post-Op Procedure(s) (LRB): ARTHROPLASTY BIPOLAR HIP (Left)   Up with therapy Discharge to SNF when medically stable  Follow up in 2 weeks Lovenox for DVT prophylaxis for 2 weeks Pain meds Rx provided

## 2013-08-24 NOTE — Progress Notes (Signed)
OT Cancellation Note  Patient Details Name: [name] MRN: [id number] DOB: 1926-07-16   Cancelled Treatment:    Reason Eval/Treat Not Completed: Medical issues which prohibited therapy. Pt feeling sick at this time. Will return later in day or next day  Paticia Moster, [place] Kung 08/24/2013, 12:55 PM

## 2013-08-24 NOTE — Clinical Documentation Improvement (Signed)
THIS DOCUMENT IS NOT A PERMANENT PART OF THE MEDICAL RECORD  Please update your documentation with the medical record to reflect your response to this query. If you need help knowing how to do this please call pgr: [phone].  08/24/13  Dear Dr[facility],  In a better effort to capture your patient's severity of illness, reflect appropriate length of stay and utilization of resources, a review of the patient medical record has revealed the following indicators the diagnosis of Heart Failure.    Based on your clinical judgment, please clarify and document in a progress note and/or discharge summary the clinical condition associated with the following supporting information:  In responding to this query please exercise your independent judgment.  The fact that a query is asked, does not imply that any particular answer is desired or expected.  Noted per 08/24/13 progr note.Marland KitchenMarland Kitchen"Diastolic CHF; Current proBNP = 804.2; previous proBNP 12/22/2007= 402-  currently asymptomatic." For accurate Dx specificity & severity can noted "Diastolic CHF" be further specified with acuity. Thank you  Possible Clinical Conditions?  CHF Left Heart Failure Diastolic Congestive Heart Failure Chronic Diastolic Congestive Heart Failure Acute Diastolic Congestive Heart Failure Acute on Chronic Diastolic Congestive Heart Failure Other Condition Cannot Clinically Determine  Supporting Information: Risk Factors: See above note  Signs & Symptoms:  See above note  Diagnostics: See Above note  Treatment: Continue trend labs, continue current medications  Reviewed: additional documentation in the medical record; 08/28/13 diastolic chf updated per chl prob list> query closed. orm  Thank You,  Toribio Harbour, RN, BSN, CCDS Certified Clinical Documentation Specialist Pager: [phone] Health Information Management [place]

## 2013-08-24 NOTE — Progress Notes (Signed)
PT Cancellation Note  Patient Details Name: TALLY MATTOX MRN: [id number] DOB: [birth year]   Cancelled Treatment:    Reason Eval/Treat Not Completed: Medical issues which prohibited therapy   Attempted to see pt for PT x2. Earlier she was nauseous and on second attempt, she said she just couldn't do it today.  Will see pt in am   Donnetta Hail 08/24/2013, 3:05 PM
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
## 2013-08-25 ENCOUNTER — Encounter (HOSPITAL_COMMUNITY): Payer: Self-pay | Admitting: Orthopedic Surgery

## 2013-08-25 LAB — CBC
HCT: 27.9 % — ABNORMAL LOW (ref 36.0–46.0)
Hemoglobin: 9.6 g/dL — ABNORMAL LOW (ref 12.0–15.0)
MCH: 30.3 pg (ref 26.0–34.0)
MCHC: 34.4 g/dL (ref 30.0–36.0)
MCV: 88 fL (ref 78.0–100.0)
RBC: 3.17 MIL/uL — ABNORMAL LOW (ref 3.87–5.11)

## 2013-08-25 LAB — COMPREHENSIVE METABOLIC PANEL
ALT: 8 U/L (ref 0–35)
AST: 13 U/L (ref 0–37)
Albumin: 2.7 g/dL — ABNORMAL LOW (ref 3.5–5.2)
Alkaline Phosphatase: 60 U/L (ref 39–117)
Chloride: 94 mEq/L — ABNORMAL LOW (ref 96–112)
GFR calc Af Amer: >90 mL/min (ref >90)
Glucose, Bld: 120 mg/dL — ABNORMAL HIGH (ref 70–99)
Potassium: 2.9 mEq/L — ABNORMAL LOW (ref 3.5–5.1)
Sodium: 130 mEq/L — ABNORMAL LOW (ref 135–145)
Total Bilirubin: 0.4 mg/dL (ref 0.3–1.2)
Total Protein: 5.9 g/dL — ABNORMAL LOW (ref 6.0–8.3)

## 2013-08-25 MED ORDER — OXYCODONE HCL ER 10 MG PO T12A
10.0000 mg | EXTENDED_RELEASE_TABLET | Freq: Two times a day (BID) | ORAL | Status: DC
  Administered 2013-08-25 – 2013-08-28 (×7): 10 mg via ORAL
  Filled 2013-08-25 (×7): qty 1

## 2013-08-25 MED ORDER — ONDANSETRON HCL 4 MG PO TABS
4.0000 mg | ORAL_TABLET | Freq: Four times a day (QID) | ORAL | Status: DC
  Administered 2013-08-25 – 2013-08-28 (×12): 4 mg via ORAL
  Filled 2013-08-25 (×16): qty 1

## 2013-08-25 MED ORDER — POTASSIUM CHLORIDE CRYS ER 20 MEQ PO TBCR
40.0000 meq | EXTENDED_RELEASE_TABLET | Freq: Once | ORAL | Status: AC
  Administered 2013-08-25: 14:00:00 40 meq via ORAL
  Filled 2013-08-25: qty 2

## 2013-08-25 MED ORDER — ONDANSETRON HCL 4 MG/2ML IJ SOLN
4.0000 mg | Freq: Four times a day (QID) | INTRAMUSCULAR | Status: DC
  Administered 2013-08-25: 4 mg via INTRAVENOUS
  Filled 2013-08-25: qty 2
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
## 2013-08-25 NOTE — Progress Notes (Signed)
Physical Therapy Treatment Patient Details Name: [name] MRN: [id number] DOB: 21-Feb-1926 Today's Date: 08/25/2013 Time: 5366-4403 PT Time Calculation (min): 44 min  PT Assessment / Plan / Recommendation  History of Present Illness Patient is an 79 y/o s/p fall with FNF and hemiarthroplasty.   PT Comments   Pt moving much better in standing today, but still requires +2 Assistance.  Continue to recommend SNF for further rehab.  Follow Up Recommendations  SNF     Does the patient have the potential to tolerate intense rehabilitation     Barriers to Discharge        Equipment Recommendations       Recommendations for Other Services    Frequency Min 3X/week   Progress towards PT Goals Progress towards PT goals: Progressing toward goals  Plan Current plan remains appropriate    Precautions / Restrictions Precautions Precautions: Posterior Hip;Fall Precaution Comments: reviewed hip precautions.  Needs reminders and cues to incorporate into mobility. Restrictions Weight Bearing Restrictions: No LLE Weight Bearing: Weight bearing as tolerated   Pertinent Vitals/Pain 6/10 L hip    Mobility  Bed Mobility Bed Mobility: Supine to Sit Supine to Sit: 1: +2 Total assist Supine to Sit: Patient Percentage: 20% Sitting - Scoot to Edge of Bed: 3: Mod assist Details for Bed Mobility Assistance: Pt attempting to A with legs, but required a lot of A with trunk. Transfers Transfers: Sit to Stand;Stand to Sit;Stand Pivot Transfers Sit to Stand: 1: +2 Total assist Sit to Stand: Patient Percentage: 50% Stand to Sit: 1: +2 Total assist Stand to Sit: Patient Percentage: 50% Stand Pivot Transfers: 1: +2 Total assist Stand Pivot Transfers: Patient Percentage: 50% Details for Transfer Assistance: Pt requiring 50% verbal cueing for use of RW and WBAT L LE while incorportaing hip precautions.    Exercises Total Joint Exercises Ankle Circles/Pumps: AROM;Seated;10 reps;Both Gluteal Sets:  Strengthening;Both;10 reps;Supine Heel Slides: AAROM;10 reps;Supine;Left Hip ABduction/ADduction: AAROM;Left;10 reps;Seated Long Arc Quad: AROM;Left;10 reps;Seated   PT Diagnosis:    PT Problem List:   PT Treatment Interventions:     PT Goals (current goals can now be found in the care plan section) Acute Rehab PT Goals Patient Stated Goal: To go to SNF for rehab  Visit Information  Last PT Received On: 08/25/13 Assistance Needed: +2 PT/OT Co-Evaluation/Treatment: Yes History of Present Illness: Patient is an 79 y/o s/p fall with FNF and hemiarthroplasty.    Subjective Data  Patient Stated Goal: To go to SNF for rehab   Cognition  Cognition Arousal/Alertness: Awake/alert Behavior During Therapy: WFL for tasks assessed/performed Overall Cognitive Status: Within Functional Limits for tasks assessed    Balance  Balance Balance Assessed: Yes Static Standing Balance Static Standing - Balance Support: Bilateral upper extremity supported Static Standing - Level of Assistance: 1: +2 Total assist Static Standing - Comment/# of Minutes: flexed posture  End of Session PT - End of Session Activity Tolerance: Patient tolerated treatment well Patient left: in chair;with call bell/phone within reach Nurse Communication: Mobility status;Other (comment) (nausea)   GP     Nawaal Alling LUBECK 08/25/2013, 11:23 AM

## 2013-08-25 NOTE — Progress Notes (Signed)
Clinical Social Work Department CLINICAL SOCIAL WORK PLACEMENT NOTE 08/25/2013  Patient:  [name], [name]  Account Number:  [id number] Admit date:  08/21/2013  Clinical Social Worker:  Orpah Greek  Date/time:  08/25/2013 12:01 PM  Clinical Social Work is seeking post-discharge placement for this patient at the following level of care:   SKILLED NURSING   (*CSW will update this form in Epic as items are completed)   08/25/2013  Patient/family provided with [facility] Department of Clinical Social Work's list of facilities offering this level of care within the geographic area requested by the patient (or if unable, by the patient's family).  08/25/2013  Patient/family informed of their freedom to choose among providers that offer the needed level of care, that participate in Medicare, Medicaid or managed care program needed by the patient, have an available bed and are willing to accept the patient.  08/25/2013  Patient/family informed of [facility]' ownership interest in [facility], as well as of the fact that they are under no obligation to receive care at this facility.  PASARR submitted to EDS on 08/25/2013 PASARR number received from EDS on 08/25/2013  FL2 transmitted to all facilities in geographic area requested by pt/family on  08/25/2013 FL2 transmitted to all facilities within larger geographic area on   Patient informed that his/her managed care company has contracts with or will negotiate with  certain facilities, including the following:     Patient/family informed of bed offers received:  08/25/2013 Patient chooses bed at [facility] Physician recommends and patient chooses bed at    Patient to be transferred to [facility] on   Patient to be transferred to facility by   The following physician request were entered in Epic:   Additional Comments:   Unice Bailey, LCSW [facility] Clinical Social Worker cell #:  [phone]

## 2013-08-25 NOTE — Evaluation (Signed)
Occupational Therapy Evaluation Patient Details Name: [name] MRN: [id number] DOB: December 03, 1925 Today's Date: 08/25/2013 Time: 1026-1100 OT Time Calculation (min): 34 min  OT Assessment / Plan / Recommendation History of present illness Patient is an 79 y/o s/p fall with FNF and hemiarthroplasty.   Clinical Impression   Pt tolerated up to [facility] and then to chair with +2 assist. Will benefit from skilled OT services to maximize ADL independence for next venue of care.    OT Assessment  Patient needs continued OT Services    Follow Up Recommendations  SNF    Barriers to Discharge      Equipment Recommendations  3 in 1 bedside comode    Recommendations for Other Services    Frequency  Min 2X/week    Precautions / Restrictions Precautions Precautions: Fall;Posterior Hip Precaution Comments: Reviewed all hip precautions with pt. She was unable to state any of precautions on her own. Restrictions Weight Bearing Restrictions: No LLE Weight Bearing: Weight bearing as tolerated   Pertinent Vitals/Pain Pt on .5 L per PT of O2. Removed O2 and pt sats were 96-97% HR 70s with activity. 7/10 pain L hip. Nursing had just given meds. PT in room to assist with exercises at end of session.    ADL  Eating/Feeding: Independent Where Assessed - Eating/Feeding: Bed level Grooming: Wash/dry hands;Set up Where Assessed - Grooming: Supine, head of bed up Upper Body Bathing: Chest;Right arm;Left arm;Abdomen;Moderate assistance (tends to lean to the R) Where Assessed - Upper Body Bathing: Unsupported sitting Lower Body Bathing: +2 Total assistance Lower Body Bathing: Patient Percentage: 20% Where Assessed - Lower Body Bathing: Supported sit to stand Upper Body Dressing: Minimal assistance Where Assessed - Upper Body Dressing: Unsupported sitting Lower Body Dressing: +2 Total assistance Lower Body Dressing: Patient Percentage: 10% Where Assessed - Lower Body Dressing: Supported sit to  stand Toilet Transfer: +2 Total assistance Toilet Transfer: Patient Percentage: 50% Toilet Transfer Method: Surveyor, minerals: Materials engineer and Hygiene: +2 Total assistance Toileting - Clothing Manipulation and Hygiene: Patient Percentage: 0% Where Assessed - Engineer, mining and Hygiene: Standing Equipment Used: Rolling walker;Gait belt ADL Comments: Pt tends to lean posteriorly in standing, needing cues for posture, hand placement and sequencing. Pivoted to [facility] and nursing tech helped with periarea hygiene as pt unable to let go of the walker currently to use UE in activity. THen pivoted to chair. Pt takes small steps and needs assist also to help manuever RW.     OT Diagnosis: Generalized weakness  OT Problem List: Decreased strength;Decreased knowledge of use of DME or AE;Decreased knowledge of precautions;Pain OT Treatment Interventions: Self-care/ADL training;DME and/or AE instruction;Therapeutic activities;Patient/family education   OT Goals(Current goals can be found in the care plan section) Acute Rehab OT Goals Patient Stated Goal: To go to [facility] for rehab and return to independent OT Goal Formulation: With patient Time For Goal Achievement: 09/01/13 Potential to Achieve Goals: Good  Visit Information  Last OT Received On: 08/25/13 Assistance Needed: +2 PT/OT Co-Evaluation/Treatment: Yes History of Present Illness: Patient is an 79 y/o s/p fall with FNF and hemiarthroplasty.       Prior Functioning     Home Living Family/patient expects to be discharged to:: Skilled nursing facility Living Arrangements: Children Additional Comments: Lives with son who works outside most of the day Prior Function Level of Independence: Independent Communication Communication: No difficulties         Vision/Perception  Cognition  Cognition Arousal/Alertness: Awake/alert Behavior During Therapy: WFL  for tasks assessed/performed Overall Cognitive Status: Within Functional Limits for tasks assessed    Extremity/Trunk Assessment Upper Extremity Assessment Upper Extremity Assessment: Overall WFL for tasks assessed     Mobility Bed Mobility Bed Mobility: Supine to Sit Supine to Sit: 1: +2 Total assist;HOB elevated Supine to Sit: Patient Percentage: 20% Details for Bed Mobility Assistance: increased time. assist for LEs over to EOB and for trunk to upright.  Transfers Transfers: Sit to Stand;Stand to Sit Sit to Stand: 1: +2 Total assist;From bed;From chair/3-in-1 Sit to Stand: Patient Percentage: 50% Stand to Sit: 1: +2 Total assist;With upper extremity assist;To chair/3-in-1 Stand to Sit: Patient Percentage: 50% Details for Transfer Assistance: cues for hand placecment, assist to rise and steady and to control descent to sit. Needs assist for sliding L LE out in front prior to transitions.      Exercise     Balance Balance Balance Assessed: Yes Static Standing Balance Static Standing - Balance Support: Bilateral upper extremity supported Static Standing - Level of Assistance: 1: +2 Total assist   End of Session OT - End of Session Equipment Utilized During Treatment: Gait belt;Rolling walker Activity Tolerance: Patient limited by pain Patient left: in chair;with call bell/phone within reach  GO     [name] [phone] 08/25/2013, 11:16 AM

## 2013-08-25 NOTE — Progress Notes (Signed)
Clinical Social Work Department BRIEF PSYCHOSOCIAL ASSESSMENT 08/25/2013  Patient:  [name], [name]     Account Number:  [id number]     Admit date:  08/21/2013  Clinical Social Worker:  Orpah Greek  Date/Time:  08/25/2013 11:57 AM  Referred by:  Physician  Date Referred:  08/25/2013 Referred for  SNF Placement   Other Referral:   Interview type:  Patient Other interview type:    PSYCHOSOCIAL DATA Living Status:  WITH ADULT CHILDREN Admitted from facility:   Level of care:   Primary support name:  April Chance (daughter) h#: [phone] c#: [phone] Primary support relationship to patient:  CHILD, ADULT Degree of support available:   good    CURRENT CONCERNS Current Concerns  Post-Acute Placement   Other Concerns:    SOCIAL WORK ASSESSMENT / PLAN CSW received consult that patient will need SNF at discharge.   Assessment/plan status:  Information/Referral to [facility] Other assessment/ plan:   Information/referral to community resources:   CSW completed FL2 and faxed information out to [facility] - provided bed offers - patient accepted bed @ [facility].    PATIENT'S/FAMILY'S RESPONSE TO PLAN OF CARE: Patient's first choice for SNF is [facility] but they did not have any beds, therefore patient is agreeable with plan for [facility] . Anticipating discharge today.       Unice Bailey, LCSW [facility] Clinical Social Worker cell #: [phone]

## 2013-08-26 LAB — COMPREHENSIVE METABOLIC PANEL
AST: 14 U/L (ref 0–37)
BUN: 6 mg/dL (ref 6–23)
CO2: 33 mEq/L — ABNORMAL HIGH (ref 19–32)
Calcium: 8.8 mg/dL (ref 8.4–10.5)
Creatinine, Ser: 0.5 mg/dL (ref 0.50–1.10)
GFR calc Af Amer: >90 mL/min (ref >90)
GFR calc non Af Amer: 86 mL/min — ABNORMAL LOW (ref >90)
Glucose, Bld: 108 mg/dL — ABNORMAL HIGH (ref 70–99)
Total Bilirubin: 0.4 mg/dL (ref 0.3–1.2)
Total Protein: 6.1 g/dL (ref 6.0–8.3)

## 2013-08-26 LAB — MAGNESIUM: Magnesium: 1.8 mg/dL (ref 1.5–2.5)

## 2013-08-26 LAB — GLUCOSE, CAPILLARY: Glucose-Capillary: 187 mg/dL — ABNORMAL HIGH (ref 70–99)

## 2013-08-26 MED ORDER — ENOXAPARIN SODIUM 40 MG/0.4ML ~~LOC~~ SOLN: 40.0000 mg | SUBCUTANEOUS | Status: DC

## 2013-08-26 MED ORDER — POTASSIUM CHLORIDE 20 MEQ PO PACK: 40.0000 meq | PACK | Freq: Two times a day (BID) | ORAL | Status: DC

## 2013-08-26 MED ORDER — POTASSIUM CHLORIDE CRYS ER 20 MEQ PO TBCR
40.0000 meq | EXTENDED_RELEASE_TABLET | Freq: Two times a day (BID) | ORAL | Status: DC
  Administered 2013-08-26 – 2013-08-28 (×4): 40 meq via ORAL
  Filled 2013-08-26 (×5): qty 2

## 2013-08-26 MED ORDER — MAGNESIUM SULFATE 40 MG/ML IJ SOLN
2.0000 g | Freq: Once | INTRAMUSCULAR | Status: AC
  Administered 2013-08-26: 2 g via INTRAVENOUS
  Filled 2013-08-26: qty 50

## 2013-08-26 MED ORDER — POTASSIUM CHLORIDE 20 MEQ PO PACK: 40.0000 meq | PACK | Freq: Every day | ORAL | Status: DC

## 2013-08-26 MED ORDER — HYDROCODONE-ACETAMINOPHEN 5-325 MG PO TABS: 1.0000 | ORAL_TABLET | Freq: Four times a day (QID) | ORAL | Status: DC | PRN

## 2013-08-26 NOTE — Progress Notes (Addendum)
   Subjective: 4 Days Post-Op Procedure(s) (LRB): ARTHROPLASTY BIPOLAR HIP (Left)   Patient reports pain as mild, states pain controlled. No events throughout the night. Wondering when she might be going to another facility.  Objective:   VITALS:   Filed Vitals:   08/26/13  BP: 149/52  Pulse: 72  Temp: 98 F (36.7 C)   Resp: 20    Neurovascular intact Dorsiflexion/Plantar flexion intact Incision: dressing C/D/I No cellulitis present Compartment soft  LABS  Recent Labs  08/24/13 0505 08/25/13 0427  HGB 9.8* 9.6*  HCT 28.2* 27.9*  WBC 9.6 9.1  PLT 238 233     Recent Labs  08/24/13 0505 08/24/13 1326 08/25/13 0427 08/26/13 0515  NA 128*  --  130* 131*  K 3.0* 3.3* 2.9* 3.2*  BUN 7  --  5* 6  CREATININE 0.53  --  0.47* 0.50  GLUCOSE 137*  --  120* 108*     Assessment/Plan: 4 Days Post-Op Procedure(s) (LRB): ARTHROPLASTY BIPOLAR HIP (Left)   Up with therapy  Discharge to SNF when medically stable  Orthopaedically stable Lovenox for DVT prophylaxis for 2 weeks  Pain meds Rx provided  Follow up in 2 weeks at [facility]. Follow up with OLIN,Navayah Sok D in 2 weeks.  Contact information:  [facility] [address] [place] [address] [phone]          Anastasio Auerbach. [name]   PAC  08/26/2013, 10:08 AM
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
## 2013-08-26 NOTE — Progress Notes (Signed)
Patient has a bed at [facility], as requested by patient & daughter - they are aware. Anticipating possible discharge today.   Clinical Social Work Department CLINICAL SOCIAL WORK PLACEMENT NOTE 08/26/2013  Patient:  [name], [name]  Account Number:  [id number] Admit date:  08/21/2013  Clinical Social Worker:  Orpah Greek  Date/time:  08/25/2013 12:01 PM  Clinical Social Work is seeking post-discharge placement for this patient at the following level of care:   SKILLED NURSING   (*CSW will update this form in Epic as items are completed)   08/25/2013  Patient/family provided with [facility] Department of Clinical Social Work's list of facilities offering this level of care within the geographic area requested by the patient (or if unable, by the patient's family).  08/25/2013  Patient/family informed of their freedom to choose among providers that offer the needed level of care, that participate in Medicare, Medicaid or managed care program needed by the patient, have an available bed and are willing to accept the patient.  08/25/2013  Patient/family informed of [facility]' ownership interest in [facility], as well as of the fact that they are under no obligation to receive care at this facility.  PASARR submitted to EDS on 08/25/2013 PASARR number received from EDS on 08/25/2013  FL2 transmitted to all facilities in geographic area requested by pt/family on  08/25/2013 FL2 transmitted to all facilities within larger geographic area on   Patient informed that his/her managed care company has contracts with or will negotiate with  certain facilities, including the following:     Patient/family informed of bed offers received:  08/25/2013 Patient chooses bed at [facility] Physician recommends and patient chooses bed at    Patient to be transferred to [facility] on   Patient to be transferred to facility by   The following physician request were  entered in Epic:   Additional Comments:   Unice Bailey, LCSW [facility] Clinical Social Worker cell #: [phone]

## 2013-08-27 ENCOUNTER — Inpatient Hospital Stay (HOSPITAL_COMMUNITY): Payer: Medicare Other

## 2013-08-27 DIAGNOSIS — K5900 Constipation, unspecified: Secondary | ICD-10-CM

## 2013-08-27 LAB — COMPREHENSIVE METABOLIC PANEL
AST: 18 U/L (ref 0–37)
Albumin: 3.1 g/dL — ABNORMAL LOW (ref 3.5–5.2)
Alkaline Phosphatase: 67 U/L (ref 39–117)
BUN: 9 mg/dL (ref 6–23)
CO2: 35 mEq/L — ABNORMAL HIGH (ref 19–32)
Calcium: 9.1 mg/dL (ref 8.4–10.5)
Chloride: 89 mEq/L — ABNORMAL LOW (ref 96–112)
Creatinine, Ser: 0.54 mg/dL (ref 0.50–1.10)
GFR calc Af Amer: >90 mL/min (ref >90)
GFR calc non Af Amer: 84 mL/min — ABNORMAL LOW (ref >90)
Potassium: 3 mEq/L — ABNORMAL LOW (ref 3.5–5.1)
Total Bilirubin: 0.6 mg/dL (ref 0.3–1.2)
Total Protein: 6.9 g/dL (ref 6.0–8.3)

## 2013-08-27 LAB — MAGNESIUM: Magnesium: 2 mg/dL (ref 1.5–2.5)

## 2013-08-27 MED ORDER — METOPROLOL SUCCINATE ER 25 MG PO TB24
25.0000 mg | ORAL_TABLET | Freq: Every day | ORAL | Status: DC
  Administered 2013-08-27 – 2013-08-28 (×2): 25 mg via ORAL
  Filled 2013-08-27 (×2): qty 1

## 2013-08-27 MED ORDER — POTASSIUM CHLORIDE CRYS ER 20 MEQ PO TBCR: 40.0000 meq | EXTENDED_RELEASE_TABLET | Freq: Every day | ORAL | Status: DC

## 2013-08-27 MED ORDER — FERROUS SULFATE 325 (65 FE) MG PO TABS: 325.0000 mg | ORAL_TABLET | Freq: Three times a day (TID) | ORAL | Status: DC

## 2013-08-27 MED ORDER — ENOXAPARIN SODIUM 40 MG/0.4ML ~~LOC~~ SOLN: 40.0000 mg | SUBCUTANEOUS | Status: DC

## 2013-08-27 MED ORDER — OXYCODONE HCL 5 MG PO TABS: 5.0000 mg | ORAL_TABLET | Freq: Four times a day (QID) | ORAL | Status: DC | PRN

## 2013-08-27 MED ORDER — ONDANSETRON HCL 4 MG PO TABS: 4.0000 mg | ORAL_TABLET | Freq: Four times a day (QID) | ORAL | Status: DC

## 2013-08-27 MED ORDER — OXYCODONE HCL ER 10 MG PO T12A: 10.0000 mg | EXTENDED_RELEASE_TABLET | Freq: Two times a day (BID) | ORAL | Status: DC

## 2013-08-27 MED ORDER — ZOLPIDEM TARTRATE 5 MG PO TABS: 5.0000 mg | ORAL_TABLET | Freq: Every evening | ORAL | Status: DC | PRN

## 2013-08-27 MED ORDER — POLYETHYLENE GLYCOL 3350 17 G PO PACK: 17.0000 g | PACK | Freq: Every day | ORAL | Status: DC | PRN

## 2013-08-27 MED ORDER — METOPROLOL SUCCINATE ER 25 MG PO TB24: 25.0000 mg | ORAL_TABLET | Freq: Every day | ORAL | Status: DC

## 2013-08-27 MED ORDER — MAGNESIUM CITRATE PO SOLN
1.0000 | Freq: Once | ORAL | Status: AC
  Administered 2013-08-27: 11:00:00 1 via ORAL

## 2013-08-27 MED ORDER — DSS 100 MG PO CAPS: 100.0000 mg | ORAL_CAPSULE | Freq: Two times a day (BID) | ORAL | Status: DC

## 2013-08-27 MED ORDER — SENNA 8.6 MG PO TABS: 1.0000 | ORAL_TABLET | Freq: Two times a day (BID) | ORAL | Status: DC

## 2013-08-27 MED ORDER — LOSARTAN POTASSIUM 100 MG PO TABS: 100.0000 mg | ORAL_TABLET | Freq: Every day | ORAL | Status: DC

## 2013-08-27 MED ORDER — METHOCARBAMOL 500 MG PO TABS: 500.0000 mg | ORAL_TABLET | Freq: Four times a day (QID) | ORAL | Status: DC | PRN

## 2013-08-27 MED ORDER — IOHEXOL 300 MG/ML  SOLN
80.0000 mL | Freq: Once | INTRAMUSCULAR | Status: AC | PRN
  Administered 2013-08-27: 21:00:00 80 mL via INTRAVENOUS

## 2013-08-27 MED ORDER — LEVOTHYROXINE SODIUM 25 MCG PO TABS: 25.0000 ug | ORAL_TABLET | Freq: Every day | ORAL | Status: DC

## 2013-08-27 MED ORDER — IOHEXOL 300 MG/ML  SOLN
50.0000 mL | Freq: Once | INTRAMUSCULAR | Status: AC | PRN
  Administered 2013-08-27: 50 mL via ORAL

## 2013-08-27 MED ORDER — BISACODYL 5 MG PO TBEC: 5.0000 mg | DELAYED_RELEASE_TABLET | Freq: Every day | ORAL | Status: DC | PRN

## 2013-08-27 MED ORDER — CALCIUM CARBONATE-VITAMIN D 500-200 MG-UNIT PO TABS: 1.0000 | ORAL_TABLET | Freq: Every day | ORAL | Status: DC

## 2013-08-27 NOTE — Progress Notes (Signed)
Call to Dr. Joseph Art regarding pt's continued constipation. Pt did consume complete bottle of mg citrate at 2pm. Pt states nausea is improving, abd remains distended, but soft, good bowel sounds. Will await new orders.

## 2013-08-27 NOTE — Progress Notes (Signed)
   Subjective: 5 Days Post-Op Procedure(s) (LRB): ARTHROPLASTY BIPOLAR HIP (Left)   Patient reports pain as mild, states pain controlled. No events throughout the night. States that she hasn't had a BM in about 8 days.  Objective:   VITALS:   Filed Vitals:   08/27/13 0615  BP: 172/57  Pulse: 67  Temp: 98.4 F (36.9 C)  Resp: 18    Neurovascular intact Dorsiflexion/Plantar flexion intact Incision: dressing C/D/I No cellulitis present Compartment soft  LABS  Recent Labs  08/25/13 0427  HGB 9.6*  HCT 27.9*  WBC 9.1  PLT 233     Recent Labs  08/25/13 0427 08/26/13 0515 08/27/13 0625  NA 130* 131* 131*  K 2.9* 3.2* 3.0*  BUN 5* 6 9  CREATININE 0.47* 0.50 0.54  GLUCOSE 120* 108* 139*     Assessment/Plan: 5 Days Post-Op Procedure(s) (LRB): ARTHROPLASTY BIPOLAR HIP (Left) Nurse states that she will mention the concern for lack of BM to medicine Up with therapy  Discharge to SNF when medically stable  Orthopaedically stable  Lovenox for DVT prophylaxis for 2 weeks  Pain meds Rx provided  Follow up in 2 weeks at [facility]. Follow up with OLIN,Nakea Gouger D in 2 weeks.  Contact information:  [facility] [address], [address] [place] [address] [phone]        Anastasio Auerbach. Lucille Crichlow   PAC  08/27/2013, 9:58 AM

## 2013-08-27 NOTE — Progress Notes (Signed)
Pt states nausea persist, and she has not had a BM since 10/08. Will adm PRN Dulcolax supp.

## 2013-08-27 NOTE — Progress Notes (Signed)
Occupational Therapy Treatment Patient Details Name: [name] MRN: [id number] DOB: 12/03/1925 Today's Date: 08/27/2013 Time: 1202-1230 OT Time Calculation (min): 28 min  OT Assessment / Plan / Recommendation  History of present illness Patient is an 79 y/o s/p fall with FNF and hemiarthroplasty.   OT comments  Pt is making progress with functional mobility and ability to let go of walker to help with managing hygiene, etc. She needs cues to [facility] to hip precautions and is unable to state precautions on her own. She will benefit from continued OT to reinforce THPs and increase independence with her basic ADL.   Follow Up Recommendations  SNF;Supervision/Assistance - 24 hour    Barriers to Discharge       Equipment Recommendations  3 in 1 bedside comode    Recommendations for Other Services    Frequency Min 2X/week   Progress towards OT Goals Progress towards OT goals: Progressing toward goals  Plan Discharge plan remains appropriate    Precautions / Restrictions Precautions Precautions: Posterior Hip;Fall Precaution Comments: reviewed hip precautions. pt not able to state any precautions on her own. Restrictions Weight Bearing Restrictions: No LLE Weight Bearing: Weight bearing as tolerated   Pertinent Vitals/Pain 5/10 L hip/groin; reposition, rest    ADL  Upper Body Bathing: Chest;Right arm;Left arm;Abdomen;Supervision/safety;Set up (supervision for erect posture for THPs) Where Assessed - Upper Body Bathing: Supported sitting Toilet Transfer: Minimal assistance;Moderate assistance Toilet Transfer Method: Stand pivot Toilet Transfer Equipment: Bedside commode Toileting - Clothing Manipulation and Hygiene: Minimal assistance Where Assessed - Glass blower/designer Manipulation and Hygiene: Standing Equipment Used: Rolling walker ADL Comments: Pt making progress with sit to stand and pivoting to [facility]. Needed cues for THPs including not to roll toes inward and  for maintaining erect posture to not exceed 90 degrees at hip.She also needs cues to extend L LE out in front of her each time she stands or sits. She felt nauseous this session and wanting to have BM. Nursing already aware. Pt voided and then requested to return to supine. She did need cues to back all the way up to the [facility] when she pivoted from chair to [facility] as she tried to let go of walker prematurely and wasnt all the way backed up to surface. She was able to assist with periarea hygiene tody with ability to let go of walker to assist. She needed min assist for balance support in standing.     OT Diagnosis:    OT Problem List:   OT Treatment Interventions:     OT Goals(current goals can now be found in the care plan section) Acute Rehab OT Goals Patient Stated Goal: To go to SNF for rehab  Visit Information  Last OT Received On: 08/27/13 Assistance Needed: +1 History of Present Illness: Patient is an 78 y/o s/p fall with FNF and hemiarthroplasty.    Subjective Data      Prior Functioning       Cognition  Cognition Arousal/Alertness: Awake/alert Behavior During Therapy: WFL for tasks assessed/performed Overall Cognitive Status: Within Functional Limits for tasks assessed    Mobility  Bed Mobility Bed Mobility: Sit to Supine Sit to Supine: 4: Min assist;HOB elevated Details for Bed Mobility Assistance: assist with L LE onto bed Transfers Transfers: Sit to Stand;Stand to Sit Sit to Stand: 4: Min assist;With upper extremity assist;From chair/3-in-1 Stand to Sit: 4: Min assist;To bed;To chair/3-in-1;With upper extremity assist Details for Transfer Assistance: verbal cues for THPs, hand placement and L LE management.  Exercises      Balance Balance Balance Assessed: Yes Dynamic Standing Balance Dynamic Standing - Level of Assistance: 4: Min assist   End of Session OT - End of Session Equipment Utilized During Treatment: Rolling walker Activity Tolerance: Patient limited  by pain (some nausea) Patient left: in bed;with call bell/phone within reach  GO     [name] [phone] 08/27/2013, 12:58 PM

## 2013-08-27 NOTE — Progress Notes (Signed)
Patient has a bed @ [facility] and is scheduled to discharge today, pending bowel movement. RN, Pattricia Boss aware that SNF stated that they can accept patient until midnight (after hours transport phone number provided: [phone]). Discharge packet in [place]. Patient's daughter, April (cell#: [phone]) aware.   Clinical Social Work Department CLINICAL SOCIAL WORK PLACEMENT NOTE 08/27/2013  Patient:  [name], [name]  Account Number:  [id number] Admit date:  08/21/2013  Clinical Social Worker:  Orpah Greek  Date/time:  08/25/2013 12:01 PM  Clinical Social Work is seeking post-discharge placement for this patient at the following level of care:   SKILLED NURSING   (*CSW will update this form in Epic as items are completed)   08/25/2013  Patient/family provided with [facility] Department of Clinical Social Work's list of facilities offering this level of care within the geographic area requested by the patient (or if unable, by the patient's family).  08/25/2013  Patient/family informed of their freedom to choose among providers that offer the needed level of care, that participate in Medicare, Medicaid or managed care program needed by the patient, have an available bed and are willing to accept the patient.  08/25/2013  Patient/family informed of [facility]' ownership interest in [facility], as well as of the fact that they are under no obligation to receive care at this facility.  PASARR submitted to EDS on 08/25/2013 PASARR number received from EDS on 08/25/2013  FL2 transmitted to all facilities in geographic area requested by pt/family on  08/25/2013 FL2 transmitted to all facilities within larger geographic area on   Patient informed that his/her managed care company has contracts with or will negotiate with  certain facilities, including the following:     Patient/family informed of bed offers received:  08/25/2013 Patient chooses bed at [facility] Physician recommends and patient chooses bed at    Patient to be transferred to [facility] on  08/27/2013 Patient to be transferred to facility by [facility]  The following physician request were entered in Epic:   Additional Comments:   Unice Bailey, LCSW [facility] Clinical Social Worker cell #: [phone]

## 2013-08-27 NOTE — Progress Notes (Signed)
Spoke with Dr. Joseph Art, states to make pt NPO and he will order CT of abd to r/o obstruction.

## 2013-08-27 NOTE — Progress Notes (Signed)
Physical Therapy Treatment Patient Details Name: [name] MRN: [id number] DOB: 1926/04/27 Today's Date: 08/27/2013 Time: 8295-6213 PT Time Calculation (min): 23 min  PT Assessment / Plan / Recommendation  History of Present Illness Patient is an 79 y/o s/p fall with FNF and hemiarthroplasty.   PT Comments   Assisted pt OOB to [facility] for attempted BM.  Pt did void and passed a lot of gas but no significant stool.  Pt too fatigued to attempt amb so assisted back to bed and positioned L LE with pillows (THP). Pt plans to D/C to SNF for ST Rehab after she is able to have a significant BM.   Follow Up Recommendations  SNF     Does the patient have the potential to tolerate intense rehabilitation     Barriers to Discharge        Equipment Recommendations  Rolling walker with 5" wheels    Recommendations for Other Services    Frequency Min 3X/week   Progress towards PT Goals Progress towards PT goals: Progressing toward goals  Plan      Precautions / Restrictions Precautions Precautions: Posterior Hip;Fall Precaution Comments: reviewed hip precautions. pt not able to state any precautions on her own. Restrictions Weight Bearing Restrictions: No LLE Weight Bearing: Weight bearing as tolerated    Pertinent Vitals/Pain C/o 4/10 hip pain    Mobility  Bed Mobility Bed Mobility: Sit to Supine;Supine to Sit Supine to Sit: 2: Max assist Sitting - Scoot to [place] of Bed: 2: Max assist Sit to Supine: 4: Min assist;HOB elevated Details for Bed Mobility Assistance: increased time and use of pad to scoot to EOB and HOB on return Transfers Transfers: Sit to Stand;Stand to Sit Sit to Stand: 4: Min assist;From bed;From toilet Stand to Sit: 4: Min assist;To toilet;To bed Details for Transfer Assistance: 50% VC's on proper tech and hand placement plus turn completion prioe to sit. Ambulation/Gait Ambulation/Gait Assistance Details: Pt declined to attempt amb due to MAX c/o fatigue.      PT Goals (current goals can now be found in the care plan section) Acute Rehab PT Goals Patient Stated Goal: To go to SNF for rehab  Visit Information  Last PT Received On: 08/27/13 Assistance Needed: +1 History of Present Illness: Patient is an 79 y/o s/p fall with FNF and hemiarthroplasty.    Subjective Data  Patient Stated Goal: To go to SNF for rehab   Cognition  Cognition Arousal/Alertness: Awake/alert Behavior During Therapy: [facility] for tasks assessed/performed Overall Cognitive Status: Within Functional Limits for tasks assessed    Balance  Balance Balance Assessed: Yes Dynamic Standing Balance Dynamic Standing - Level of Assistance: 4: Min assist  End of Session PT - End of Session Equipment Utilized During Treatment: Gait belt Activity Tolerance: Patient limited by fatigue Patient left: with call bell/phone within reach   [name]  PTA [facility]  Acute  Rehab Pager      [phone]

## 2013-08-28 DIAGNOSIS — S72143A Displaced intertrochanteric fracture of unspecified femur, initial encounter for closed fracture: Secondary | ICD-10-CM

## 2013-08-28 LAB — COMPREHENSIVE METABOLIC PANEL
ALT: 15 U/L (ref 0–35)
AST: 21 U/L (ref 0–37)
Alkaline Phosphatase: 62 U/L (ref 39–117)
CO2: 35 mEq/L — ABNORMAL HIGH (ref 19–32)
Calcium: 9 mg/dL (ref 8.4–10.5)
Chloride: 91 mEq/L — ABNORMAL LOW (ref 96–112)
Creatinine, Ser: 0.55 mg/dL (ref 0.50–1.10)
GFR calc non Af Amer: 83 mL/min — ABNORMAL LOW (ref >90)
Potassium: 3.7 mEq/L (ref 3.5–5.1)
Sodium: 131 mEq/L — ABNORMAL LOW (ref 135–145)
Total Bilirubin: 0.6 mg/dL (ref 0.3–1.2)
Total Protein: 6.1 g/dL (ref 6.0–8.3)

## 2013-08-28 NOTE — Progress Notes (Signed)
Patient is set to discharge to [facility] SNF today. Patient & daughter, April aware. Discharge packet in [place]. PTAR scheduled for 2:30p pickup (Service Request Id: [id number]).  Clinical Social Work Department CLINICAL SOCIAL WORK PLACEMENT NOTE 08/28/2013  Patient:  [name], [name]  Account Number:  [id number] Admit date:  08/21/2013  Clinical Social Worker:  Orpah Greek  Date/time:  08/25/2013 12:01 PM  Clinical Social Work is seeking post-discharge placement for this patient at the following level of care:   SKILLED NURSING   (*CSW will update this form in Epic as items are completed)   08/25/2013  Patient/family provided with [facility] Department of Clinical Social Work's list of facilities offering this level of care within the geographic area requested by the patient (or if unable, by the patient's family).  08/25/2013  Patient/family informed of their freedom to choose among providers that offer the needed level of care, that participate in Medicare, Medicaid or managed care program needed by the patient, have an available bed and are willing to accept the patient.  08/25/2013  Patient/family informed of [facility]' ownership interest in [facility], as well as of the fact that they are under no obligation to receive care at this facility.  PASARR submitted to EDS on 08/25/2013 PASARR number received from EDS on 08/25/2013  FL2 transmitted to all facilities in geographic area requested by pt/family on  08/25/2013 FL2 transmitted to all facilities within larger geographic area on   Patient informed that his/her managed care company has contracts with or will negotiate with  certain facilities, including the following:     Patient/family informed of bed offers received:  08/25/2013 Patient chooses bed at [facility] Physician recommends and patient chooses bed at    Patient to be transferred to [facility] on  08/28/2013 Patient to be transferred to  facility by PTAR  The following physician request were entered in Epic:   Additional Comments:   Unice Bailey, LCSW [facility] Clinical Social Worker cell #: [phone]

## 2013-08-28 NOTE — Progress Notes (Signed)
Physical Therapy Treatment Patient Details Name: [name] MRN: [id number] DOB: 06-03-1926 Today's Date: 08/28/2013 Time: 4098-1191 PT Time Calculation (min): 27 min  PT Assessment / Plan / Recommendation  History of Present Illness Patient is an 79 y/o s/p fall with FNF and hemiarthroplasty.   PT Comments   Assisted pt OOB to amb in hallway.  Pt progressing better and plans to D/C to SNF for ST Rehab.  Follow Up Recommendations  SNF     Does the patient have the potential to tolerate intense rehabilitation     Barriers to Discharge        Equipment Recommendations       Recommendations for Other Services    Frequency     Progress towards PT Goals Progress towards PT goals: Progressing toward goals  Plan      Precautions / Restrictions Precautions Precautions: Posterior Hip;Fall Restrictions Weight Bearing Restrictions: No LLE Weight Bearing: Weight bearing as tolerated    Pertinent Vitals/Pain C/o "some" hip pain    Mobility  Bed Mobility Bed Mobility: Supine to Sit Supine to Sit: 2: Max assist Details for Bed Mobility Assistance: increased time and use of pad to scoot to EOB and HOB on return Transfers Transfers: Sit to Stand;Stand to Sit Sit to Stand: 4: Min assist;From bed Stand to Sit: 4: Min assist;To bed Details for Transfer Assistance: 50% VC's on proper tech and hand placement plus turn completion prioe to sit. Ambulation/Gait Ambulation/Gait Assistance: 4: Min assist Ambulation Distance (Feet): 36 Feet Assistive device: Rolling walker Ambulation/Gait Assistance Details: increased time and 25% vc's on safety with turns Gait Pattern: Step-to pattern Gait velocity: decreased     PT Goals (current goals can now be found in the care plan section)    Visit Information  Last PT Received On: 08/28/13 Assistance Needed: +1 History of Present Illness: Patient is an 79 y/o s/p fall with FNF and hemiarthroplasty.    Subjective Data       Cognition       Balance     End of Session PT - End of Session Equipment Utilized During Treatment: Gait belt Activity Tolerance: Patient limited by fatigue Patient left: with call bell/phone within reach Nurse Communication: Mobility status;Other (comment)   [name]  PTA [facility]  Acute  Rehab Pager      [phone]

## 2013-08-28 NOTE — Progress Notes (Signed)
Patient ID: [name], female   DOB: 27-Aug-1926, 79 y.o.   MRN: [id number] Subjective: 6 Days Post-Op Procedure(s) (LRB): ARTHROPLASTY BIPOLAR HIP (Left)    Patient reports pain as mild.  States the hip feels sore. Nausea improving after bowel activity for first time since admission  Objective:   VITALS:   Filed Vitals:   08/28/13 0458  BP: 168/67  Pulse: 72  Temp: 98.3 F (36.8 C)  Resp: 18    Neurovascular intact Incision: dressing C/D/I  LABS No results found for this basename: HGB, HCT, WBC, PLT,  in the last 72 hours   Recent Labs  08/26/13 0515 08/27/13 0625 08/28/13 0434  NA 131* 131* 131*  K 3.2* 3.0* 3.7  BUN 6 9 9   CREATININE 0.50 0.54 0.55  GLUCOSE 108* 139* 117*    No results found for this basename: LABPT, INR,  in the last 72 hours   Assessment/Plan: 6 Days Post-Op Procedure(s) (LRB): ARTHROPLASTY BIPOLAR HIP (Left)   Discharge to SNF when medically stable Continue with bowel program to maintain activity

## 2013-08-31 ENCOUNTER — Other Ambulatory Visit: Payer: Self-pay | Admitting: *Deleted

## 2013-08-31 ENCOUNTER — Non-Acute Institutional Stay (SKILLED_NURSING_FACILITY): Payer: Medicare Other | Admitting: Internal Medicine

## 2013-08-31 ENCOUNTER — Encounter: Payer: Self-pay | Admitting: Internal Medicine

## 2013-08-31 DIAGNOSIS — E871 Hypo-osmolality and hyponatremia: Secondary | ICD-10-CM

## 2013-08-31 DIAGNOSIS — S72002S Fracture of unspecified part of neck of left femur, sequela: Secondary | ICD-10-CM

## 2013-08-31 DIAGNOSIS — S72009S Fracture of unspecified part of neck of unspecified femur, sequela: Secondary | ICD-10-CM

## 2013-08-31 DIAGNOSIS — E876 Hypokalemia: Secondary | ICD-10-CM

## 2013-08-31 DIAGNOSIS — I509 Heart failure, unspecified: Secondary | ICD-10-CM

## 2013-08-31 DIAGNOSIS — I5030 Unspecified diastolic (congestive) heart failure: Secondary | ICD-10-CM

## 2013-08-31 DIAGNOSIS — I4891 Unspecified atrial fibrillation: Secondary | ICD-10-CM

## 2013-08-31 DIAGNOSIS — K5900 Constipation, unspecified: Secondary | ICD-10-CM

## 2013-08-31 MED ORDER — OXYCODONE HCL ER 10 MG PO T12A: EXTENDED_RELEASE_TABLET | ORAL | Status: DC

## 2013-08-31 NOTE — Progress Notes (Signed)
Patient ID: [name], female   DOB: 03/16/1926, 79 y.o.   MRN: [id number]  [facility]    PCP: [name], MD  Code Status: full code   Allergies  Allergen Reactions  . Ace Inhibitors Cough  . Aspirin Nausea Only  . Sulfa Drugs Cross Reactors Nausea Only    Chief Complaint: new admit  HPI:  79 y/o female patient is here for short term rehabilitation post hospital admission after a fall and undergoing left hip hemiarthroplasty utilizing DePuy component, size 3 standard Summit Basic stem with a 43mm unipolar ball with a +0 adapter. She has history of hiatal hernia with intrathoracic stomach S/P Laparoscopic repair with Nissen fundalplication, cholelithiasis, GI bleed, neoplasm of the breast, A. fib with RVR S/P Conversion 03/12/2009, Diastolic CHF,HLD, HTN, S/P St. Jude permanent pacemaker for sick sinus syndrome 03/27/2008 among others. She was seen in her room today. She mentions her pan to be under control but complaints of muscle tightness. She also has bee constipated for 2 days. No other complaints. On review of her discharge summary and medication list in the facility, there has been some discrepancies with the medication (due to 2 discharge summary and med list being sent with patient from hospital on admission). Reviewed them for now  Review of Systems  Constitutional: Negative for fever, chills, weight loss, malaise/fatigue and diaphoresis.  HENT: Negative for hearing loss and sore throat.   Eyes: Negative for blurred vision, double vision and photophobia.  Respiratory: Negative for cough, sputum production and shortness of breath.   Cardiovascular: Positive for leg swelling. Negative for chest pain, palpitations and PND.  Gastrointestinal: Positive for heartburn, nausea and constipation. Negative for vomiting, abdominal pain and diarrhea.  Genitourinary: Negative for dysuria, urgency and frequency.  Musculoskeletal: Positive for joint pain. Negative for falls and  myalgias.  Skin: Negative for itching and rash.  Neurological: Negative for dizziness, seizures and headaches.  Endo/Heme/Allergies: Does not bruise/bleed easily.  Psychiatric/Behavioral: Negative for depression, suicidal ideas and memory loss.     Past Medical History  Diagnosis Date  . Hypertension   . Hyperlipidemia   . Allergy   . Osteoporosis   . History of gastroesophageal reflux (GERD)   . Afib     No Coumadin secondary to history of GI bleed.  Marland Kitchen History of sick sinus syndrome   . Diverticulosis of colon with hemorrhage   . Diastolic dysfunction    Past Surgical History  Procedure Laterality Date  . Pacemaker insertion    . Vaginal hysterectomy    . Nissen fundoplication    . Laparoscopic reduction of intrathoracic stomach and repair of hiatal hernia    . Kyphosis surgery      X3  . Hip arthroplasty Left 08/22/2013    Procedure: ARTHROPLASTY BIPOLAR HIP;  Surgeon: Shelda Pal, MD;  Location: [facility];  Service: Orthopedics;  Laterality: Left;   Social History:   reports that she quit smoking about 34 years ago. Her smoking use included Cigarettes. She smoked 0.00 packs per day. She has never used smokeless tobacco. She reports that she does not drink alcohol or use illicit drugs.  Family History  Problem Relation Age of Onset  . Colon cancer Mother     Medications: Patient's Medications  New Prescriptions   No medications on file  Previous Medications   BISACODYL (DULCOLAX) 5 MG EC TABLET    Take 1 tablet (5 mg total) by mouth daily as needed.   CALCIUM CARBONATE-VITAMIN D (CALCIUM 600 +  D PO)    Take 1 tablet by mouth daily.   CALCIUM-VITAMIN D (OSCAL WITH D) 500-200 MG-UNIT PER TABLET    Take 1 tablet by mouth daily.   DILTIAZEM (DILACOR XR) 240 MG 24 HR CAPSULE    Take 240 mg by mouth daily.    DOCUSATE SODIUM (COLACE) 100 MG CAPSULE    Take 100 mg by mouth 2 (two) times daily as needed for constipation.    DOCUSATE SODIUM 100 MG CAPS    Take 100 mg by  mouth 2 (two) times daily.   ENOXAPARIN (LOVENOX) 40 MG/0.4ML INJECTION    Inject 0.4 mLs (40 mg total) into the skin daily.   ENOXAPARIN (LOVENOX) 40 MG/0.4ML INJECTION    Inject 0.4 mLs (40 mg total) into the skin daily.   ESOMEPRAZOLE (NEXIUM) 40 MG CAPSULE    Take 40 mg by mouth daily as needed (heartburn). Patient only takes when needed   FERROUS SULFATE 325 (65 FE) MG TABLET    Take 1 tablet (325 mg total) by mouth 3 (three) times daily after meals.   FUROSEMIDE (LASIX) 40 MG TABLET    Take 40 mg by mouth daily.    HYDROCODONE-ACETAMINOPHEN (NORCO) 5-325 MG PER TABLET    Take 1-2 tablets by mouth every 6 (six) hours as needed for pain.   LEVOTHYROXINE (SYNTHROID, LEVOTHROID) 25 MCG TABLET    Take 25 mcg by mouth daily before breakfast.   LEVOTHYROXINE (SYNTHROID, LEVOTHROID) 25 MCG TABLET    Take 1 tablet (25 mcg total) by mouth daily before breakfast.   LOSARTAN (COZAAR) 100 MG TABLET    Take 1 tablet (100 mg total) by mouth daily.   LOSARTAN POTASSIUM PO    Take 100 mg by mouth daily.    METHOCARBAMOL (ROBAXIN) 500 MG TABLET    Take 1 tablet (500 mg total) by mouth every 6 (six) hours as needed.   METOPROLOL SUCCINATE (TOPROL-XL) 25 MG 24 HR TABLET    Take 1 tablet (25 mg total) by mouth daily.   MULTIPLE VITAMIN (MULTIVITAMIN) CAPSULE    Take 1 capsule by mouth daily.     OMEGA-3 ACID ETHYL ESTERS (LOVAZA) 1 G CAPSULE    Take 2 g by mouth daily.   ONDANSETRON (ZOFRAN) 4 MG TABLET    Take 1 tablet (4 mg total) by mouth 4 (four) times daily.   OXYCODONE (OXY IR/ROXICODONE) 5 MG IMMEDIATE RELEASE TABLET    Take 1 tablet (5 mg total) by mouth every 6 (six) hours as needed.   OXYCODONE (OXYCONTIN) 10 MG T12A 12 HR TABLET    Take 1 tablet (10 mg total) by mouth every 12 (twelve) hours.   POLYETHYLENE GLYCOL (MIRALAX / GLYCOLAX) PACKET    Take 17 g by mouth daily as needed.   POTASSIUM CHLORIDE SA (K-DUR,KLOR-CON) 20 MEQ TABLET    Take 2 tablets (40 mEq total) by mouth daily.   SENNA (SENOKOT)  8.6 MG TABS TABLET    Take 1 tablet (8.6 mg total) by mouth 2 (two) times daily.   ZOLPIDEM (AMBIEN) 5 MG TABLET    Take 1 tablet (5 mg total) by mouth at bedtime as needed for sleep (insomnia).  Modified Medications   No medications on file  Discontinued Medications   No medications on file    Physical Exam  Nursing note and vitals reviewed. Constitutional: She is oriented to person, place, and time. She appears well-developed and well-nourished. No distress.  HENT:  Head: Normocephalic and atraumatic.  Mouth/Throat: Oropharynx is clear and moist.  Eyes: Conjunctivae and EOM are normal. Pupils are equal, round, and reactive to light.  Neck: Normal range of motion. Neck supple. No thyromegaly present.  Cardiovascular: Normal rate, regular rhythm, normal heart sounds and intact distal pulses.  Exam reveals no friction rub.   No murmur heard. Pulmonary/Chest: Effort normal and breath sounds normal. No respiratory distress.  Abdominal: Soft. Bowel sounds are normal. She exhibits no distension and no mass. There is no tenderness. There is no rebound and no guarding.  Musculoskeletal: Normal range of motion.  ROM limited in left hip area. Trace edema in left leg, ted hose in place. Dressing clean and dry  Neurological: She is alert and oriented to person, place, and time.  Skin: Skin is warm and dry. No rash noted. She is not diaphoretic. No erythema. No pallor.  Psychiatric: She has a normal mood and affect. Her behavior is normal. Judgment and thought content normal.    Labs reviewed: Basic Metabolic Panel:  Recent Labs  04/54/09 0515 08/27/13 0625 08/28/13 0434  NA 131* 131* 131*  K 3.2* 3.0* 3.7  CL 93* 89* 91*  CO2 33* 35* 35*  GLUCOSE 108* 139* 117*  BUN 6 9 9   CREATININE 0.50 0.54 0.55  CALCIUM 8.8 9.1 9.0  MG 1.8 2.0 2.4   Liver Function Tests:  Recent Labs  08/26/13 0515 08/27/13 0625 08/28/13 0434  AST 14 18 21   ALT 7 11 15   ALKPHOS 59 67 62  BILITOT 0.4  0.6 0.6  PROT 6.1 6.9 6.1  ALBUMIN 2.7* 3.1* 2.9*   No results found for this basename: LIPASE, AMYLASE,  in the last 8760 hours No results found for this basename: AMMONIA,  in the last 8760 hours CBC:  Recent Labs  08/21/13 1620 08/22/13 0415 08/23/13 0410 08/24/13 0505 08/25/13 0427  WBC 9.7 10.8* 10.8* 9.6 9.1  NEUTROABS 7.4 8.7*  --   --   --   HGB 12.3 13.0 11.4* 9.8* 9.6*  HCT 35.4* 37.8 32.8* 28.2* 27.9*  MCV 88.3 88.1 87.5 87.6 88.0  PLT 299 287 255 238 233   CBG:  Recent Labs  08/26/13 1630  GLUCAP 187*    Radiological Exams: X-ray left hip 08/21/2013   Left femoral neck fracture with varus angulation and proximal   displacement of the distal fragment.    Assessment/Plan Left femoral neck fracture- s/p left hip hemiarthroplasty, continue skin care. To follow with Dr Charlann Boxer. Continue lovenox for dvt prophylaxis. Will chage her pain med to oxycontin 10 mg bid and oxyIR 5 mg q6h prn for now. Will also add robaxin for muscle spasm. To work with PT and OT gait training and muscle strengthening  chf- euvolemic at present. Continue toprol, losartan 100 mg daily.Continue lasix, monitor weight  afib- rate currently controlled. continue diltizem at 240 mg daily and toprol at 25 mg daily for now with amiodarone 100 mg daily. This is after reviewing 2 discharge summaries. To follow with her cardiologist on discharge  Constipation- will continue colace and add senna and prn miralax to the regimen  Iron def anemia- monitor h/, continue ferrous sulfate 325 mg tid  Osteoporosis- continue calcium and vit d, monitor for fall  gerd- with her reflux being more prominent will change her nexium to 40 mg daily for now  Hypokalemia- will resume her home dose kcl supplement, monitor bmp  Hyponatremia- noted on discharge from hospital. Patient is aaox 3. Will monitor bmp  Insomnia- continue  zolpidem for now and monitor clinically  Family/ staff Communication: reviewed care plan  with patient and nursing supervisor   Labs/tests ordered- cbc, bmp  Spent 50 minutes with the patient/ patient care

## 2013-09-01 ENCOUNTER — Encounter: Payer: Self-pay | Admitting: Nurse Practitioner

## 2013-09-24 ENCOUNTER — Non-Acute Institutional Stay (SKILLED_NURSING_FACILITY): Payer: Medicare Other | Admitting: Nurse Practitioner

## 2013-09-24 DIAGNOSIS — S72009S Fracture of unspecified part of neck of unspecified femur, sequela: Secondary | ICD-10-CM

## 2013-09-24 DIAGNOSIS — K5900 Constipation, unspecified: Secondary | ICD-10-CM

## 2013-09-24 DIAGNOSIS — I4891 Unspecified atrial fibrillation: Secondary | ICD-10-CM

## 2013-09-24 DIAGNOSIS — S72002S Fracture of unspecified part of neck of left femur, sequela: Secondary | ICD-10-CM

## 2013-09-24 DIAGNOSIS — I5030 Unspecified diastolic (congestive) heart failure: Secondary | ICD-10-CM

## 2013-09-24 DIAGNOSIS — I509 Heart failure, unspecified: Secondary | ICD-10-CM

## 2013-09-28 ENCOUNTER — Encounter: Payer: Self-pay | Admitting: Internal Medicine

## 2013-09-28 DIAGNOSIS — I495 Sick sinus syndrome: Secondary | ICD-10-CM

## 2013-11-18 ENCOUNTER — Other Ambulatory Visit: Payer: Self-pay | Admitting: Nurse Practitioner

## 2013-11-18 NOTE — Progress Notes (Signed)
This encounter was created in error - please disregard.

## 2013-11-22 NOTE — Progress Notes (Signed)
Date of Visit 09/24/2013 Isaias Cowman, rm # [id number]  Patient ID: [name], female   DOB: 04/16/1926, 80 y.o.   MRN:   Code Status: full code   Allergies  Allergen Reactions  . Ace Inhibitors Cough  . Aspirin Nausea Only  . Sulfa Drugs Cross Reactors Nausea Only    Chief Complaint  Patient presents with  . Discharge Note   HPI:  Pt is an 80 year old female s/p R femoral neck fracture with a hemi-arthroplasty. Was admitted to [facility] 08/21/2013 for PT/OT for safety, mobility, strengthening, and adaptation of ADL's. She is scheduled today for d/c.    Reports ongoing operative area pain.    Review of Systems  Constitutional: Negative for fever, chills, weight loss, malaise/fatigue and diaphoresis.  HENT: Negative for hearing loss and sore throat.   Eyes: Negative for blurred vision, double vision and photophobia.  Respiratory: Negative for cough, sputum production and shortness of breath.   Cardiovascular: Positive for leg swelling. Negative for chest pain, palpitations and PND.  Gastrointestinal: Positive for heartburn, nausea and constipation. Negative for vomiting, abdominal pain and diarrhea.  Genitourinary: Negative for dysuria, urgency and frequency.  Musculoskeletal: Positive for joint pain. Negative for falls and myalgias.  Skin: Negative for itching and rash.  Neurological: Negative for dizziness, seizures and headaches.  Endo/Heme/Allergies: Does not bruise/bleed easily.  Psychiatric/Behavioral: Negative for depression, suicidal ideas and memory loss.     Past Medical History  Diagnosis Date  . Hypertension   . Hyperlipidemia   . Allergy   . Osteoporosis   . History of gastroesophageal reflux (GERD)   . Afib     No Coumadin secondary to history of GI bleed.  Marland Kitchen History of sick sinus syndrome   . Diverticulosis of colon with hemorrhage   . Diastolic dysfunction    Past Surgical History  Procedure Laterality Date  . Pacemaker insertion    . Vaginal  hysterectomy    . Nissen fundoplication    . Laparoscopic reduction of intrathoracic stomach and repair of hiatal hernia    . Kyphosis surgery      X3  . Hip arthroplasty Left 08/22/2013    Procedure: ARTHROPLASTY BIPOLAR HIP;  Surgeon: Mauri Pole, MD;  Location: [facility];  Service: Orthopedics;  Laterality: Left;   Social History:   reports that she quit smoking about 34 years ago. Her smoking use included Cigarettes. She smoked 0.00 packs per day. She has never used smokeless tobacco. She reports that she does not drink alcohol or use illicit drugs.  Family History  Problem Relation Age of Onset  . Colon cancer Mother     Medications: Patient's Medications  New Prescriptions   No medications on file  Previous Medications   BISACODYL (DULCOLAX) 5 MG EC TABLET    Take 1 tablet (5 mg total) by mouth daily as needed.   CALCIUM CARBONATE-VITAMIN D (CALCIUM 600 + D PO)    Take 1 tablet by mouth daily.   CALCIUM-VITAMIN D (OSCAL WITH D) 500-200 MG-UNIT PER TABLET    Take 1 tablet by mouth daily.   DILTIAZEM (DILACOR XR) 240 MG 24 HR CAPSULE    Take 240 mg by mouth daily.    DOCUSATE SODIUM (COLACE) 100 MG CAPSULE    Take 100 mg by mouth 2 (two) times daily as needed for constipation.    DOCUSATE SODIUM 100 MG CAPS    Take 100 mg by mouth 2 (two) times daily.   ENOXAPARIN (  LOVENOX) 40 MG/0.4ML INJECTION    Inject 0.4 mLs (40 mg total) into the skin daily.   ENOXAPARIN (LOVENOX) 40 MG/0.4ML INJECTION    Inject 0.4 mLs (40 mg total) into the skin daily.   ESOMEPRAZOLE (NEXIUM) 40 MG CAPSULE    Take 40 mg by mouth daily as needed (heartburn). Patient only takes when needed   FERROUS SULFATE 325 (65 FE) MG TABLET    Take 1 tablet (325 mg total) by mouth 3 (three) times daily after meals.   FUROSEMIDE (LASIX) 40 MG TABLET    Take 40 mg by mouth daily.    HYDROCODONE-ACETAMINOPHEN (NORCO) 5-325 MG PER TABLET    Take 1-2 tablets by mouth every 6 (six) hours as needed for pain.   LEVOTHYROXINE  (SYNTHROID, LEVOTHROID) 25 MCG TABLET    Take 25 mcg by mouth daily before breakfast.   LEVOTHYROXINE (SYNTHROID, LEVOTHROID) 25 MCG TABLET    Take 1 tablet (25 mcg total) by mouth daily before breakfast.   LOSARTAN (COZAAR) 100 MG TABLET    Take 1 tablet (100 mg total) by mouth daily.   LOSARTAN POTASSIUM PO    Take 100 mg by mouth daily.    METHOCARBAMOL (ROBAXIN) 500 MG TABLET    Take 1 tablet (500 mg total) by mouth every 6 (six) hours as needed.   METOPROLOL SUCCINATE (TOPROL-XL) 25 MG 24 HR TABLET    Take 1 tablet (25 mg total) by mouth daily.   MULTIPLE VITAMIN (MULTIVITAMIN) CAPSULE    Take 1 capsule by mouth daily.     OMEGA-3 ACID ETHYL ESTERS (LOVAZA) 1 G CAPSULE    Take 2 g by mouth daily.   ONDANSETRON (ZOFRAN) 4 MG TABLET    Take 1 tablet (4 mg total) by mouth 4 (four) times daily.   OXYCODONE (OXY IR/ROXICODONE) 5 MG IMMEDIATE RELEASE TABLET    Take 1 tablet (5 mg total) by mouth every 6 (six) hours as needed.   OXYCODONE (OXYCONTIN) 10 MG T12A 12 HR TABLET    Take one tablet by mouth every 12 hours for pain   POLYETHYLENE GLYCOL (MIRALAX / GLYCOLAX) PACKET    Take 17 g by mouth daily as needed.   POTASSIUM CHLORIDE SA (K-DUR,KLOR-CON) 20 MEQ TABLET    Take 2 tablets (40 mEq total) by mouth daily.   SENNA (SENOKOT) 8.6 MG TABS TABLET    Take 1 tablet (8.6 mg total) by mouth 2 (two) times daily.   ZOLPIDEM (AMBIEN) 5 MG TABLET    Take 1 tablet (5 mg total) by mouth at bedtime as needed for sleep (insomnia).  Modified Medications   No medications on file  Discontinued Medications   No medications on file   Vital Signs:  BP 13882, RR 18, T 97.4, Pulse Ox 96%  Physical Exam  Nursing note and vitals reviewed. Constitutional: She is oriented to person, place, and time. She appears well-developed and well-nourished. No distress.  HENT:  Head: Normocephalic and atraumatic.  Mouth/Throat: Oropharynx is clear and moist.  Eyes: Conjunctivae and EOM are normal. Pupils are equal, round,  and reactive to light. Right eye exhibits no discharge. Left eye exhibits no discharge. No scleral icterus.  Positive for arcus senilis  Neck: Normal range of motion. Neck supple. No thyromegaly present.  Cardiovascular: Normal rate, regular rhythm, normal heart sounds and intact distal pulses.  Exam reveals no friction rub.   No murmur heard. Pulmonary/Chest: Effort normal and breath sounds normal. No respiratory distress.  Abdominal: Soft. Bowel  sounds are normal. She exhibits no distension and no mass. There is no tenderness. There is no rebound and no guarding.  Musculoskeletal: Normal range of motion.  ROM limited in left hip area. Trace edema in left leg, ted hose in place.   Lymphadenopathy:    She has no cervical adenopathy.  Neurological: She is alert and oriented to person, place, and time.  Skin: Skin is warm and dry. No rash noted. She is not diaphoretic. No erythema. No pallor.  Incision line is well healed.  Psychiatric: She has a normal mood and affect. Her behavior is normal. Judgment and thought content normal.    Labs reviewed: Basic Metabolic Panel:  Recent Labs  08/26/13 0515 08/27/13 0625 08/28/13 0434  NA 131* 131* 131*  K 3.2* 3.0* 3.7  CL 93* 89* 91*  CO2 33* 35* 35*  GLUCOSE 108* 139* 117*  BUN 6 9 9   CREATININE 0.50 0.54 0.55  CALCIUM 8.8 9.1 9.0  MG 1.8 2.0 2.4   Liver Function Tests:  Recent Labs  08/26/13 0515 08/27/13 0625 08/28/13 0434  AST 14 18 21   ALT 7 11 15   ALKPHOS 59 67 62  BILITOT 0.4 0.6 0.6  PROT 6.1 6.9 6.1  ALBUMIN 2.7* 3.1* 2.9*   No results found for this basename: LIPASE, AMYLASE,  in the last 8760 hours No results found for this basename: AMMONIA,  in the last 8760 hours CBC:  Recent Labs  08/21/13 1620 08/22/13 0415 08/23/13 0410 08/24/13 0505 08/25/13 0427  WBC 9.7 10.8* 10.8* 9.6 9.1  NEUTROABS 7.4 8.7*  --   --   --   HGB 12.3 13.0 11.4* 9.8* 9.6*  HCT 35.4* 37.8 32.8* 28.2* 27.9*  MCV 88.3 88.1 87.5  87.6 88.0  PLT 299 287 255 238 233   CBG:  Recent Labs  08/26/13 1630  GLUCAP 187*    Most recent labs, 09/07/2013 WBC 8.1 RBC 3.7 Hemoglobin 11.1 Hematocrit 35.6 MCV 96.5 MCH 30.1 MCHC 31.2 RDW 15.3 Platelets 475  Sodium 133 2 Potassium 4.4 Chloride 96  CO2 27 AGAP 8 Glucose 128 BUN 11 Creatinine 0.6 Calcium 9.4    Radiological Exams: X-ray left hip 08/21/2013   Left femoral neck fracture with varus angulation and proximal   displacement of the distal fragment.    Assessment/Plan  Left femoral neck fracture- s/p left hip hemiarthroplasty, continue skin care. To follow with Dr Alvan Dame. Continue lovenox for dvt prophylaxis.   Pt to be d/c'ed with [facility] services of PT/OT for strengthening, mobility, safety, and ADL adaptation  CHF, Stable will c a ontinue toprol, losartan 100 mg daily.Continue lasix, monitor weight  Will continue f/u with cardiologist  A-Fib, rate currently controlled. continue diltizem at 240 mg daily and toprol at 25 mg daily for now with amiodarone 100 mg daily. Will continue f/u with cardiologist.    Constipation- will continue colace and add senna and prn miralax to the regimen  Mildly elevated platelet count, now decreasing, will be monitored by pt's PCP  Osteoporosis- continue calcium and vit d, monitor for fall  GERD, continue  nexium to 40 mg daily for now  Hypokalemia- will resume her home dose kcl supplement, monitor bmp  Hyponatremia- noted on discharge from hospital. Patient is aaox 3. Will monitor bmp  Insomnia- continue zolpidem for now and monitor clinically  Pt will have ongoing care and f/u with per PCP, cardiologist, and orthopedist

## 2013-12-14 ENCOUNTER — Encounter: Payer: Self-pay | Admitting: Internal Medicine

## 2013-12-14 ENCOUNTER — Ambulatory Visit (INDEPENDENT_AMBULATORY_CARE_PROVIDER_SITE_OTHER): Payer: Medicare Other | Admitting: Internal Medicine

## 2013-12-14 VITALS — BP 154/60 | HR 59 | Ht 63.0 in | Wt 127.8 lb

## 2013-12-14 DIAGNOSIS — I10 Essential (primary) hypertension: Secondary | ICD-10-CM

## 2013-12-14 DIAGNOSIS — Z8679 Personal history of other diseases of the circulatory system: Secondary | ICD-10-CM

## 2013-12-14 DIAGNOSIS — I4891 Unspecified atrial fibrillation: Secondary | ICD-10-CM

## 2013-12-14 DIAGNOSIS — K922 Gastrointestinal hemorrhage, unspecified: Secondary | ICD-10-CM

## 2013-12-14 DIAGNOSIS — Z950 Presence of cardiac pacemaker: Secondary | ICD-10-CM

## 2013-12-14 DIAGNOSIS — I495 Sick sinus syndrome: Secondary | ICD-10-CM | POA: Insufficient documentation

## 2013-12-14 LAB — MDC_IDC_ENUM_SESS_TYPE_INCLINIC
Battery Impedance: 1100 Ohm
Battery Voltage: 2.79 V
Brady Statistic RA Percent Paced: 95 %
Implantable Pulse Generator Model: 5826
Lead Channel Impedance Value: 353 Ohm
Lead Channel Impedance Value: 612 Ohm
Lead Channel Pacing Threshold Amplitude: 0.5 V
Lead Channel Pacing Threshold Pulse Width: 0.4 ms
Lead Channel Pacing Threshold Pulse Width: 0.4 ms
Lead Channel Setting Pacing Amplitude: 2.5 V
Lead Channel Setting Pacing Pulse Width: 0.4 ms
MDC IDC MSMT LEADCHNL RV PACING THRESHOLD AMPLITUDE: 0.75 V
MDC IDC PG SERIAL: 2117330
MDC IDC SESS DTM: 20150202163221
MDC IDC SET LEADCHNL RA PACING AMPLITUDE: 2 V
MDC IDC SET LEADCHNL RV SENSING SENSITIVITY: 2.5 mV
MDC IDC STAT BRADY RV PERCENT PACED: 24 %

## 2013-12-14 NOTE — Patient Instructions (Signed)
Your physician wants you to follow-up in: 6 months in device clinic and 12 months with Ileene Hutchinson, PAYou will receive a reminder letter in the mail two months in advance. If you don't receive a letter, please call our office to schedule the follow-up appointment.

## 2013-12-14 NOTE — Progress Notes (Signed)
Angelica Neer, MD: Primary Cardiologist:  Dr Blain Pais is a 80 y.o. female with a h/o bradycardia sp PPM (MDT) by Dr Leonia Reeves who presents today to establish care in the Electrophysiology device clinic.  She reports symptomatic sinus pauses in 2009 for which she underwent PPM implantation. The patient reports doing very well since having a pacemaker implanted and remains very active despite her age.   Today, she  denies symptoms of palpitations, chest pain, shortness of breath, orthopnea, PND, lower extremity edema, dizziness, presyncope, syncope, or neurologic sequela.  She has known afib but has not been anticoagulated due to prior GI bleeding.  The patientis tolerating medications without difficulties and is otherwise without complaint today.   Past Medical History  Diagnosis Date  . Hyperlipidemia   . Allergy   . Osteoporosis   . History of sick sinus syndrome 03/2008    s/p PPM  . Diverticulosis of colon with hemorrhage   . Diastolic dysfunction   . Unspecified vitamin D deficiency   . Asymptomatic varicose veins   . Esophageal reflux   . Mixed hyperlipidemia   . Iron deficiency anemia secondary to blood loss (chronic)   . Osteoarthritis   . Skin cancer of nose     Removed by derm  . Hypothyroidism   . Venous stasis dermatitis     Encouraged to wear compression stockings  . CKD (chronic kidney disease) stage 2, GFR 60-89 ml/min   . Neuropathy     Associated with another disease  . Diabetes mellitus with renal manifestation     AODM  . Hypertension     Significant WCS  . Benign hypertension with chronic kidney disease, stage III   . History of GI diverticular bleed 11/2011    ASA stopped  . GERD (gastroesophageal reflux disease)     s/p Nissen Repair, Dr. Hassell Done  . Fracture of T11 vertebra 2010    Compression fracture  . Dizziness     When getting up from a sitting position, no recent falls  . Leg edema   . Afib     No Coumadin secondary to history  of GI bleed.Marland Kitchen ECHO 12/04/11 LVEF estimated by 2D at 60-65%.   Past Surgical History  Procedure Laterality Date  . Pacemaker insertion  03/29/08    For SSS. SJM Zephyr XL DR Implanted by Dr. Leonia Reeves.  . Vaginal hysterectomy    . Nissen fundoplication    . Laparoscopic reduction of intrathoracic stomach and repair of hiatal hernia    . Kyphosis surgery  2010 & 2012    x3  . Hip arthroplasty Left 08/22/2013    Procedure: ARTHROPLASTY BIPOLAR HIP;  Surgeon: Mauri Pole, MD;  Location: [facility];  Service: Orthopedics;  Laterality: Left;  . Hiatal hernia repair    . Cystocele repair      x 2    History   Social History  . Marital Status: Widowed    Spouse Name: N/A    Number of Children: N/A  . Years of Education: N/A   Occupational History  . Not on file.   Social History Main Topics  . Smoking status: Former Smoker    Types: Cigarettes    Quit date: 12/09/1978  . Smokeless tobacco: Never Used  . Alcohol Use: No  . Drug Use: No  . Sexual Activity: No   Other Topics Concern  . Not on file   Social History Narrative  . No narrative on file  Family History  Problem Relation Age of Onset  . Colon cancer Mother   . CAD Father   . CAD Sister     Allergies  Allergen Reactions  . Ace Inhibitors Cough  . Aspirin Nausea Only  . Sulfa Drugs Cross Reactors Nausea Only  . Atorvastatin Other (See Comments)    Leg swelling  . Welchol [Colesevelam Hcl] Hives    Current Outpatient Prescriptions  Medication Sig Dispense Refill  . amiodarone (PACERONE) 200 MG tablet Take 200 mg by mouth daily.       . Calcium Carbonate-Vitamin D (CALCIUM 600 + D PO) Take 1 tablet by mouth daily.      . Cholecalciferol (VITAMIN D) 1000 UNITS capsule Take 1,000 Units by mouth daily.       . furosemide (LASIX) 40 MG tablet Take 40 mg by mouth daily.       Marland Kitchen HYDROcodone-acetaminophen (NORCO) 7.5-325 MG per tablet Take 1-2 tablets by mouth every 6 (six) hours as needed for moderate pain.        Marland Kitchen levothyroxine (SYNTHROID, LEVOTHROID) 50 MCG tablet Take 50 mcg by mouth daily before breakfast.      . losartan (COZAAR) 100 MG tablet Take 1 tablet (100 mg total) by mouth daily.  30 tablet  0  . MATZIM LA 240 MG 24 hr tablet Take 240 mg by mouth daily.       . metoprolol (TOPROL-XL) 200 MG 24 hr tablet Take 200 mg by mouth daily.      . Omega-3 Fatty Acids (FISH OIL) 1000 MG CAPS Take 1 capsule by mouth daily.      . pravastatin (PRAVACHOL) 40 MG tablet Take 40 mg by mouth daily.       No current facility-administered medications for this visit.    ROS- all systems are reviewed and negative except as per HPI  Physical Exam: Filed Vitals:   12/14/13 1129  BP: 154/60  Pulse: 59  Height: 5\' 3"  (1.6 m)  Weight: 127 lb 12.8 oz (57.97 kg)    GEN- The patient is well appearing, alert and oriented x 3 today.   Head- normocephalic, atraumatic Eyes-  Sclera clear, conjunctiva pink Ears- hearing intact Oropharynx- clear Neck- supple, no JVP Lymph- no cervical lymphadenopathy Lungs- Clear to ausculation bilaterally, normal work of breathing Chest- pacemaker pocket is well healed Heart- Regular rate and rhythm, no murmurs, rubs or gallops, PMI not laterally displaced GI- soft, NT, ND, + BS Extremities- no clubbing, cyanosis, or edema MS- age appropriate muscle atrophy Skin- no rash or lesion Psych- euthymic mood, full affect Neuro- strength and sensation are intact  Pacemaker interrogation- reviewed in detail today,  See PACEART report  Assessment and Plan:  1. Sick sinus syndrome Normal pacemaker function See Pace Art report No changes today  2. HTN Stable  2 gram sodium diet advised  3. afib Well controlled (No afib by PPM interrogated today) Given prior GI bleeding she is not felt to be a candidate for anticoagulation  4. HL Stable No change required today  TTMs Return to the device clinic in 6 months Follow-up with Ileene Hutchinson in 1 year Follow-up with  Dr Irish Lack as scheduled.

## 2013-12-28 ENCOUNTER — Encounter: Payer: Self-pay | Admitting: Internal Medicine

## 2014-02-08 ENCOUNTER — Ambulatory Visit: Payer: Medicare Other | Admitting: Interventional Cardiology

## 2014-03-11 ENCOUNTER — Other Ambulatory Visit: Payer: Self-pay | Admitting: Cardiology

## 2014-03-11 MED ORDER — AMIODARONE HCL 200 MG PO TABS: 200.0000 mg | ORAL_TABLET | Freq: Every day | ORAL | Status: DC

## 2014-03-15 DIAGNOSIS — I495 Sick sinus syndrome: Secondary | ICD-10-CM

## 2014-03-22 ENCOUNTER — Other Ambulatory Visit: Payer: Self-pay | Admitting: Cardiology

## 2014-03-22 MED ORDER — AMIODARONE HCL 200 MG PO TABS: 100.0000 mg | ORAL_TABLET | Freq: Every day | ORAL | Status: DC

## 2014-04-01 ENCOUNTER — Encounter: Payer: Self-pay | Admitting: Internal Medicine

## 2014-04-06 ENCOUNTER — Ambulatory Visit (INDEPENDENT_AMBULATORY_CARE_PROVIDER_SITE_OTHER): Payer: Medicare Other | Admitting: Interventional Cardiology

## 2014-04-06 ENCOUNTER — Encounter: Payer: Self-pay | Admitting: Interventional Cardiology

## 2014-04-06 VITALS — BP 154/78 | HR 71 | Ht 63.0 in | Wt 130.0 lb

## 2014-04-06 DIAGNOSIS — I10 Essential (primary) hypertension: Secondary | ICD-10-CM

## 2014-04-06 DIAGNOSIS — I4891 Unspecified atrial fibrillation: Secondary | ICD-10-CM

## 2014-04-06 DIAGNOSIS — Z950 Presence of cardiac pacemaker: Secondary | ICD-10-CM

## 2014-04-06 DIAGNOSIS — E782 Mixed hyperlipidemia: Secondary | ICD-10-CM | POA: Insufficient documentation

## 2014-04-06 NOTE — Patient Instructions (Signed)
Your physician recommends that you continue on your current medications as directed. Please refer to the Current Medication list given to you today.  Your physician wants you to follow-up in: 1 year with Dr. Varanasi. You will receive a reminder letter in the mail two months in advance. If you don't receive a letter, please call our office to schedule the follow-up appointment.  

## 2014-04-06 NOTE — Progress Notes (Signed)
Patient ID: [name], female   DOB: Sep 10, 1926, 80 y.o.   MRN: [id number]    [place], [place]  [address] Phone: [phone] Fax:  [phone]  Date:  04/06/2014   ID:  [name], DOB [birth year], MRN [id number]  PCP:  Mayra Neer, MD      History of Present Illness: [name] is a 80 y.o. female who has had AFib and HTN. She had a second GI bleed in January 2013. Aspirin was stopped. No bleeding since then. SHe does bruise easily. Atrial Fibrillation F/U:  c/o Dizziness while getting up from sitting position; no recent falls.  c/o Leg edema.  Denies : Chest pain.  Orthopnea.  Palpitations.  Syncope.  Elevates her legs which helps her swelling.  She had a fall in 10/14 and broke her hip.  SHe had surgery and went to NH or 8 days.  She can feel her HR when she lies in a certain position.  It resolves with a position.   BP at home in the high 517O systolic.    Wt Readings from Last 3 Encounters:  04/06/14 130 lb (58.968 kg)  12/14/13 127 lb 12.8 oz (57.97 kg)  08/28/13 132 lb 2.9 oz (59.956 kg)     Past Medical History  Diagnosis Date  . Hyperlipidemia   . Allergy   . Osteoporosis   . History of sick sinus syndrome 03/2008    s/p PPM  . Diverticulosis of colon with hemorrhage   . Diastolic dysfunction   . Unspecified vitamin D deficiency   . Asymptomatic varicose veins   . Esophageal reflux   . Mixed hyperlipidemia   . Iron deficiency anemia secondary to blood loss (chronic)   . Osteoarthritis   . Skin cancer of nose     Removed by derm  . Hypothyroidism   . Venous stasis dermatitis     Encouraged to wear compression stockings  . CKD (chronic kidney disease) stage 2, GFR 60-89 ml/min   . Neuropathy     Associated with another disease  . Diabetes mellitus with renal manifestation     AODM  . Hypertension     Significant WCS  . Benign hypertension with chronic kidney disease, stage III   . History of GI  diverticular bleed 11/2011    ASA stopped  . GERD (gastroesophageal reflux disease)     s/p Nissen Repair, Dr. Hassell Done  . Fracture of T11 vertebra 2010    Compression fracture  . Dizziness     When getting up from a sitting position, no recent falls  . Leg edema   . Afib     No Coumadin secondary to history of GI bleed.Marland Kitchen ECHO 12/04/11 LVEF estimated by 2D at 60-65%.    Current Outpatient Prescriptions  Medication Sig Dispense Refill  . amiodarone (PACERONE) 200 MG tablet Take 0.5 tablets (100 mg total) by mouth daily.  45 tablet  0  . Calcium Carbonate-Vitamin D (CALCIUM 600 + D PO) Take 1 tablet by mouth daily.      . Cholecalciferol (VITAMIN D) 1000 UNITS capsule Take 1,000 Units by mouth daily.       . furosemide (LASIX) 40 MG tablet Take 40 mg by mouth daily.       Marland Kitchen HYDROcodone-acetaminophen (NORCO) 7.5-325 MG per tablet Take 1-2 tablets by mouth every 6 (six) hours as needed for moderate pain.      Marland Kitchen levothyroxine (SYNTHROID, LEVOTHROID) 50 MCG tablet  Take 50 mcg by mouth daily before breakfast.      . losartan (COZAAR) 100 MG tablet Take 1 tablet (100 mg total) by mouth daily.  30 tablet  0  . MATZIM LA 240 MG 24 hr tablet Take 240 mg by mouth daily.       . metoprolol (TOPROL-XL) 200 MG 24 hr tablet Take 200 mg by mouth daily.      . Omega-3 Fatty Acids (FISH OIL) 1000 MG CAPS Take 1 capsule by mouth daily.      . pravastatin (PRAVACHOL) 40 MG tablet Take 40 mg by mouth daily.       No current facility-administered medications for this visit.    Allergies:    Allergies  Allergen Reactions  . Ace Inhibitors Cough  . Aspirin Nausea Only  . Sulfa Drugs Cross Reactors Nausea Only  . Atorvastatin Other (See Comments)    Leg swelling  . Welchol [Colesevelam Hcl] Hives    Social History:  The patient  reports that she quit smoking about 35 years ago. Her smoking use included Cigarettes. She smoked 0.00 packs per day. She has never used smokeless tobacco. She reports that she  does not drink alcohol or use illicit drugs.   Family History:  The patient's family history includes CAD in her father and sister; Colon cancer in her mother.   ROS:  Please see the history of present illness.  No nausea, vomiting.  No fevers, chills.  No focal weakness.  No dysuria.  Edema-better in the morning.  All other systems reviewed and negative.   PHYSICAL EXAM: VS:  BP 154/78  Pulse 71  Ht 5\' 3"  (1.6 m)  Wt 130 lb (58.968 kg)  BMI 23.03 kg/m2 Well nourished, well developed, in no acute distress HEENT: normal Neck: no JVD, no carotid bruits Cardiac:  normal S1, S2; RRR;  Lungs:  clear to auscultation bilaterally, no wheezing, rhonchi or rales Abd: soft, nontender, no hepatomegaly Ext: bilateral LE pitting edema Skin: warm and dry Neuro:   no focal abnormalities noted  EKG:   Ventricular paced in October 2014  ASSESSMENT AND PLAN:  Atrial fibrillation  Continue Pacerone Tablet, 200 MG, 1/2 tablet, Orally, Once a day IMAGING: EKG    Harward,Amy 02/09/2013 11:29:51 AM > Lindbergh Winkles,JAY 02/09/2013 11:45:46 AM > Likely NSR, artifact at baseline, regular rhythm, no AFib   Notes: Unable to tolerate anticoagulation due to GI bleed. No palpitations. Maintaining regular rhythm. She had been difficult to rate control in the past.  Given her October 2014 fall, I would not consider anticoagulation. Maintaining sinus rhythm.    2. Mixed hyperlipidemia  Continue Fish Oil Capsule, 1000 MG, 1 capsule with a meal, Orally, once a day Continue Pravastatin Sodium Tablet, 40 MG, 1 tablet, Orally, Once a day Notes: LDL 108, 113 at last two checks. LDL 95 in Jan 21015   3. Cardiac pacemaker in situ  Notes: Continue pacer checks.    4. Essential hypertension, benign  Continue Diltiazem HCl Coated Beads Tablet Extended Release 24 Hour, 240 MG, 1 tablet at the same time each day, Orally, Once a day Continue Toprol XL Tablet Extended Release 24 Hour, 200 MG, 1 tablet, Orally, Once a  day Continue Losartan Potassium Tablet, 100 MG, 1 tablet, Orally, Once a day Notes: Tolerating Losartan. She was conccerned about the dye used in the tablet last year, but this is now tolerated well. BP controlled as well.     Preventive Medicine  Adult topics discussed:  Exercise: 5 days a week, at least 30 minutes of aerobic exercise.      Signed, Mina Marble, MD, [facility] 04/06/2014 11:38 AM

## 2014-05-26 ENCOUNTER — Other Ambulatory Visit: Payer: Self-pay | Admitting: Family Medicine

## 2014-05-26 DIAGNOSIS — R1310 Dysphagia, unspecified: Secondary | ICD-10-CM

## 2014-05-31 ENCOUNTER — Ambulatory Visit
Admission: RE | Admit: 2014-05-31 | Discharge: 2014-05-31 | Disposition: A | Discharge status: Home / Self Care | Payer: Medicare Other | Source: Ambulatory Visit | Attending: Family Medicine | Admitting: Family Medicine

## 2014-05-31 DIAGNOSIS — R1310 Dysphagia, unspecified: Secondary | ICD-10-CM

## 2014-06-04 ENCOUNTER — Ambulatory Visit
Admission: RE | Admit: 2014-06-04 | Discharge: 2014-06-04 | Disposition: A | Discharge status: Home / Self Care | Payer: Medicare Other | Source: Ambulatory Visit | Attending: Family Medicine | Admitting: Family Medicine

## 2014-06-04 ENCOUNTER — Other Ambulatory Visit: Payer: Self-pay | Admitting: Family Medicine

## 2014-06-04 DIAGNOSIS — E871 Hypo-osmolality and hyponatremia: Secondary | ICD-10-CM

## 2014-06-15 DIAGNOSIS — I495 Sick sinus syndrome: Secondary | ICD-10-CM

## 2014-06-23 ENCOUNTER — Other Ambulatory Visit: Payer: Self-pay | Admitting: Gastroenterology

## 2014-06-24 ENCOUNTER — Encounter (HOSPITAL_COMMUNITY): Payer: Self-pay | Admitting: Pharmacy Technician

## 2014-06-24 ENCOUNTER — Encounter (HOSPITAL_COMMUNITY): Payer: Self-pay | Admitting: *Deleted

## 2014-06-25 ENCOUNTER — Other Ambulatory Visit: Payer: Self-pay | Admitting: *Deleted

## 2014-06-25 MED ORDER — AMIODARONE HCL 200 MG PO TABS: 100.0000 mg | ORAL_TABLET | Freq: Every morning | ORAL | Status: DC

## 2014-06-25 NOTE — Progress Notes (Signed)
Per Perioperative Prescription for implanted cardiac device programming orders -Procedure should not interfere with device function and Rep. Not called.

## 2014-06-29 ENCOUNTER — Ambulatory Visit (HOSPITAL_COMMUNITY)
Admission: RE | Admit: 2014-06-29 | Discharge: 2014-06-29 | Disposition: A | Discharge status: Home / Self Care | Payer: Medicare Other | Source: Ambulatory Visit | Attending: Gastroenterology | Admitting: Gastroenterology

## 2014-06-29 ENCOUNTER — Encounter (HOSPITAL_COMMUNITY): Payer: Self-pay | Admitting: *Deleted

## 2014-06-29 ENCOUNTER — Encounter (HOSPITAL_COMMUNITY)
Admission: RE | Disposition: A | Discharge status: Home / Self Care | Payer: Self-pay | Source: Ambulatory Visit | Attending: Gastroenterology

## 2014-06-29 ENCOUNTER — Ambulatory Visit (HOSPITAL_COMMUNITY): Payer: Medicare Other | Admitting: Anesthesiology

## 2014-06-29 ENCOUNTER — Encounter (HOSPITAL_COMMUNITY): Payer: Medicare Other | Admitting: Anesthesiology

## 2014-06-29 DIAGNOSIS — M810 Age-related osteoporosis without current pathological fracture: Secondary | ICD-10-CM | POA: Insufficient documentation

## 2014-06-29 DIAGNOSIS — I495 Sick sinus syndrome: Secondary | ICD-10-CM | POA: Insufficient documentation

## 2014-06-29 DIAGNOSIS — Z885 Allergy status to narcotic agent status: Secondary | ICD-10-CM | POA: Diagnosis not present

## 2014-06-29 DIAGNOSIS — Z950 Presence of cardiac pacemaker: Secondary | ICD-10-CM | POA: Insufficient documentation

## 2014-06-29 DIAGNOSIS — Z90710 Acquired absence of both cervix and uterus: Secondary | ICD-10-CM | POA: Insufficient documentation

## 2014-06-29 DIAGNOSIS — E119 Type 2 diabetes mellitus without complications: Secondary | ICD-10-CM | POA: Insufficient documentation

## 2014-06-29 DIAGNOSIS — Z886 Allergy status to analgesic agent status: Secondary | ICD-10-CM | POA: Insufficient documentation

## 2014-06-29 DIAGNOSIS — E7800 Pure hypercholesterolemia, unspecified: Secondary | ICD-10-CM | POA: Insufficient documentation

## 2014-06-29 DIAGNOSIS — I4891 Unspecified atrial fibrillation: Secondary | ICD-10-CM | POA: Insufficient documentation

## 2014-06-29 DIAGNOSIS — K224 Dyskinesia of esophagus: Secondary | ICD-10-CM | POA: Diagnosis present

## 2014-06-29 DIAGNOSIS — Z882 Allergy status to sulfonamides status: Secondary | ICD-10-CM | POA: Insufficient documentation

## 2014-06-29 DIAGNOSIS — Z888 Allergy status to other drugs, medicaments and biological substances status: Secondary | ICD-10-CM | POA: Insufficient documentation

## 2014-06-29 HISTORY — PX: ESOPHAGOGASTRODUODENOSCOPY (EGD) WITH PROPOFOL: SHX5813

## 2014-06-29 HISTORY — PX: BALLOON DILATION: SHX5330

## 2014-06-29 LAB — BASIC METABOLIC PANEL
Anion gap: 15 (ref 5–15)
BUN: 12 mg/dL (ref 6–23)
CALCIUM: 9.8 mg/dL (ref 8.4–10.5)
CO2: 24 meq/L (ref 19–32)
Chloride: 95 mEq/L — ABNORMAL LOW (ref 96–112)
Creatinine, Ser: 0.72 mg/dL (ref 0.50–1.10)
GFR calc Af Amer: 88 mL/min — ABNORMAL LOW (ref >90)
GFR calc non Af Amer: 76 mL/min — ABNORMAL LOW (ref >90)
GLUCOSE: 110 mg/dL — AB (ref 70–99)
POTASSIUM: 4.5 meq/L (ref 3.7–5.3)
Sodium: 134 mEq/L — ABNORMAL LOW (ref 137–147)

## 2014-06-29 LAB — GLUCOSE, CAPILLARY: GLUCOSE-CAPILLARY: 98 mg/dL (ref 70–99)

## 2014-06-29 SURGERY — ESOPHAGOGASTRODUODENOSCOPY (EGD) WITH PROPOFOL
Anesthesia: Monitor Anesthesia Care

## 2014-06-29 MED ORDER — SODIUM CHLORIDE 0.9 % IV SOLN: INTRAVENOUS | Status: DC

## 2014-06-29 MED ORDER — PROPOFOL INFUSION 10 MG/ML OPTIME
INTRAVENOUS | Status: DC | PRN
  Administered 2014-06-29: 75 ug/kg/min via INTRAVENOUS

## 2014-06-29 MED ORDER — PROPOFOL 10 MG/ML IV BOLUS
INTRAVENOUS | Status: AC
  Filled 2014-06-29: qty 20

## 2014-06-29 MED ORDER — LACTATED RINGERS IV SOLN
INTRAVENOUS | Status: DC
  Administered 2014-06-29: 1000 mL via INTRAVENOUS

## 2014-06-29 MED ORDER — LIDOCAINE HCL (CARDIAC) 20 MG/ML IV SOLN
INTRAVENOUS | Status: AC
  Filled 2014-06-29: qty 5

## 2014-06-29 MED ORDER — LIDOCAINE HCL (CARDIAC) 20 MG/ML IV SOLN
INTRAVENOUS | Status: DC | PRN
  Administered 2014-06-29: 80 mg via INTRAVENOUS

## 2014-06-29 SURGICAL SUPPLY — 15 items

## 2014-06-29 NOTE — Discharge Instructions (Signed)
Esophageal Dilatation °The esophagus is the long, narrow tube which carries food and liquid from the mouth to the stomach. Esophageal dilatation is the technique used to stretch a blocked or narrowed portion of the esophagus. This procedure is used when a part of the esophagus has become so narrow that it becomes difficult, painful or even impossible to swallow. This is generally an uncomplicated form of treatment. When this is not successful, chest surgery may be required. This is a much more extensive form of treatment with a longer recovery time. °CAUSES  °Some of the more common causes of blockage or strictures of the esophagus are: °· Narrowing from longstanding inflammation (soreness and redness) of the lower esophagus. This comes from the constant exposure of the lower esophagus to the acid which bubbles up from the stomach. Over time this causes scarring and narrowing of the lower esophagus. °· Hiatal hernia in which a small part of the stomach bulges (herniates) up through the diaphragm. This can cause a gradual narrowing of the end of the esophagus. °· Schatzki ring is a narrow ring of benign (non-cancerous) fibrous tissue which constricts the lower esophagus. The reason for this is not known. °· Scleroderma is a connective tissue disorder that affects the esophagus and makes swallowing difficult. °· Achalasia is an absence of nerves to the lower esophagus and to the esophageal sphincter. This is the circular muscle between the stomach and esophagus that relaxes to allow food into the stomach. After swallowing, it contracts to keep food in the stomach. This absence of nerves may be congenital (present since birth). This can cause irregular spasms of the lower esophageal muscle. This spasm does not open up to allow food and fluid through. The result is a persistent blockage with subsequent slow trickling of the esophageal contents into the stomach. °· Strictures may develop from swallowing materials which  damage the esophagus. Some examples are strong acids or alkalis such as lye. °· Growths such as benign (non-cancerous) and malignant (cancerous) tumors can block the esophagus. °· Hereditary (present since birth) causes. °DIAGNOSIS  °Your caregiver often suspects this problem by taking a medical history. They will also do a physical exam. They can then prove their suspicions using X-rays and endoscopy. Endoscopy is an exam in which a tube like a small, flexible telescope is used to look at your esophagus.  °TREATMENT °There are different stretching (dilating) techniques that can be used. Simple bougie dilatation may be done in the office. This usually takes only a couple minutes. A numbing (anesthetic) spray of the throat is used. Endoscopy, when done, is done in an endoscopy suite under mild sedation. When fluoroscopy is used, the procedure is performed in X-ray. Other techniques require a little longer time. Recovery is usually quick. There is no waiting time to begin eating and drinking to test success of the treatment. Following are some of the methods used. °Narrowing of the esophagus is treated by making it bigger. °Commonly this is a mechanical problem which can be treated with stretching. This can be done in different ways. Your caregiver will discuss these with you. Some of the means used are: °· A series of graduated (increasing thickness) flexible dilators can be used. These are weighted tubes passed through the esophagus into the stomach. The tubes used become progressively larger until the desired stretched size is reached. Graduated dilators are a simple and quick way of opening the esophagus. No visualization is required. °· Another method is the use of endoscopy to place   a flexible wire across the stricture. The endoscope is removed and the wire left in place. A dilator with a hole through it from end to end is guided down the esophagus and across the stricture. One or more of these dilators are  passed over the wire. At the end of the exam, the wire is removed. This type of treatment may be performed in the X-ray department under fluoroscopy. An advantage of this procedure is the examiner is visualizing the end opening in the esophagus. °· Stretching of the esophagus may be done using balloons. Deflated balloons are placed through the endoscope and across the stricture. This type of balloon dilatation is often done at the time of endoscopy or fluoroscopy. Flexible endoscopy allows the examiner to directly view the stricture. A balloon is inserted in the deflated form into the area of narrowing. It is then inflated with air to a certain pressure that is preset for a given circumference. When inflated, it becomes sausage shaped, stretched, and makes the stricture larger. °· Achalasia requires a longer, larger balloon-type dilator. This is frequently done under X-ray control. In this situation, the spastic muscle fibers in the lower esophagus are stretched. °All of the above procedures make the passage of food and water into the stomach easier. They also make it easier for stomach contents to reflux back into the esophagus. Special medications may be used following the procedure to help prevent further stricturing. Proton-pump inhibitor medications are good at decreasing the amount of acid in the stomach juice. When stomach juice refluxes into the esophagus, the juice is no longer as acidic and is less likely to burn or scar the esophagus. °RISKS AND COMPLICATIONS °Esophageal dilatation is usually performed effectively and without problems. Some complications that can occur are: °· A small amount of bleeding almost always happens where the stretching takes place. If this is too excessive it may require more aggressive treatment. °· An uncommon complication is perforation (making a hole) of the esophagus. The esophagus is thin. It is easy to make a hole in it. If this happens, an operation may be necessary to  repair this. °· A small, undetected perforation could lead to an infection in the chest. This can be very serious. °HOME CARE INSTRUCTIONS  °· If you received sedation for your procedure, do not drive, make important decisions, or perform any activities requiring your full coordination. Do not drink alcohol, take sedatives, or use any mind altering chemicals unless instructed by your caregiver. °· You may use throat lozenges or warm salt water gargles if you have throat discomfort. °· You can begin eating and drinking normally on return home unless instructed otherwise. Do not purposely try to force large chunks of food down to test the benefits of your procedure. °· Mild discomfort can be eased with sips of ice water. °· Medications for discomfort may or may not be needed. °SEEK IMMEDIATE MEDICAL CARE IF:  °· You begin vomiting up blood. °· You develop black, tarry stools. °· You develop chills or an unexplained temperature of over 101°F (38.3°C) °· You develop chest or abdominal pain. °· You develop shortness of breath, or feel light-headed or faint. °· Your swallowing is becoming more painful, difficult, or you are unable to swallow. °MAKE SURE YOU:  °· Understand these instructions. °· Will watch your condition. °· Will get help right away if you are not doing well or get worse. °Document Released: 12/20/2005 Document Revised: 03/15/2014 Document Reviewed: 02/06/2006 °[employer]® Patient Information ©2015 [employer].   This information is not intended to replace advice given to you by your health care provider. Make sure you discuss any questions you have with your health care provider. ° °

## 2014-06-29 NOTE — Transfer of Care (Signed)
Immediate Anesthesia Transfer of Care Note  Patient: [name]  Procedure(s) Performed: Procedure(s): ESOPHAGOGASTRODUODENOSCOPY (EGD) WITH PROPOFOL (N/A) BALLOON DILATION (N/A)  Patient Location: PACU  Anesthesia Type:MAC  Level of Consciousness: awake, alert , oriented and patient cooperative  Airway & Oxygen Therapy: Patient Spontanous Breathing and Patient connected to nasal cannula oxygen  Post-op Assessment: Report given to PACU RN, Post -op Vital signs reviewed and stable and Patient moving all extremities  Post vital signs: Reviewed and stable  Complications: No apparent anesthesia complications

## 2014-06-29 NOTE — Anesthesia Preprocedure Evaluation (Addendum)
Anesthesia Evaluation  Patient identified by MRN, date of birth, ID band Patient awake    Reviewed: Allergy & Precautions, H&P , NPO status , Patient's Chart, lab work & pertinent test results  Airway Mallampati: II TM Distance: >3 FB Neck ROM: Full    Dental no notable dental hx.    Pulmonary neg pulmonary ROS, former smoker,  breath sounds clear to auscultation  Pulmonary exam normal       Cardiovascular hypertension, Pt. on medications and Pt. on home beta blockers negative cardio ROS  + dysrhythmias Atrial Fibrillation + pacemaker Rhythm:Regular Rate:Normal     Neuro/Psych negative neurological ROS  negative psych ROS   GI/Hepatic negative GI ROS, Neg liver ROS,   Endo/Other  negative endocrine ROSdiabetes  Renal/GU Renal InsufficiencyRenal disease  negative genitourinary   Musculoskeletal negative musculoskeletal ROS (+)   Abdominal   Peds negative pediatric ROS (+)  Hematology negative hematology ROS (+)   Anesthesia Other Findings   Reproductive/Obstetrics negative OB ROS                          Anesthesia Physical Anesthesia Plan  ASA: III  Anesthesia Plan: MAC   Post-op Pain Management:    Induction: Intravenous  Airway Management Planned: Nasal Cannula  Additional Equipment:   Intra-op Plan:   Post-operative Plan:   Informed Consent: I have reviewed the patients History and Physical, chart, labs and discussed the procedure including the risks, benefits and alternatives for the proposed anesthesia with the patient or authorized representative who has indicated his/her understanding and acceptance.   Dental advisory given  Plan Discussed with: CRNA and Surgeon  Anesthesia Plan Comments:         Anesthesia Quick Evaluation

## 2014-06-29 NOTE — Anesthesia Postprocedure Evaluation (Signed)
  Anesthesia Post-op Note  Patient: [name]  Procedure(s) Performed: Procedure(s) (LRB): ESOPHAGOGASTRODUODENOSCOPY (EGD) WITH PROPOFOL (N/A) BALLOON DILATION (N/A)  Patient Location: PACU  Anesthesia Type: MAC  Level of Consciousness: awake and alert   Airway and Oxygen Therapy: Patient Spontanous Breathing  Post-op Pain: mild  Post-op Assessment: Post-op Vital signs reviewed, Patient's Cardiovascular Status Stable, Respiratory Function Stable, Patent Airway and No signs of Nausea or vomiting  Last Vitals:  Filed Vitals:   06/29/14 1323  BP: 143/63  Pulse: 72  Temp: 36.6 C  Resp: 72    Post-op Vital Signs: stable   Complications: No apparent anesthesia complications

## 2014-06-29 NOTE — Op Note (Signed)
Problem: Dysphagia. 05/31/2014 barium esophagram showed poor esophageal motility, erosions in the mid esophagus, and obstruction in the distal esophagus following Nissen fundoplication surgery. 10/10/2009 normal esophagogastroduodenoscopy post Nissen fundoplication.  Endoscopist: Earle Gell  Premedication: Propofol administered by anesthesia  Procedure: Diagnostic esophagogastroduodenoscopy with balloon dilation at the esophagogastric junction The patient was placed in the left lateral decubitus position. The Pentax gastroscope was passed through the posterior hypopharynx into the proximal esophagus without difficulty. The hypopharynx, larynx, and vocal cords appeared normal.  Esophagoscopy: The proximal and mid segments of the esophageal mucosa appeared normal. The squamocolumnar junction was noted at 37 cm from the incisor teeth. There was slight narrowing at the esophagogastric junction compatible with an intact gastric wrap associated with a Nissen fundoplication surgery. Endoscopically there was no signs of esophageal stricture formation. Using the esophageal balloon dilator, the balloon was inflated from 15 mm to 16.5 mm without mucosal disruption suggesting the absence of an esophageal stricture. The esophagus was somewhat tortuous. There were no esophageal mucosal erosions.  Gastroscopy: Retroflex view of the gastric cardia and fundus was normal. The gastric wrap associated with a Nissen fundoplication appeared intact. The gastric body, antrum, and pylorus appeared normal.  Duodenoscopy: The duodenal bulb and descending duodenum appeared normal.  Assessment: Normal esophagogastroduodenoscopy post Nissen fundoplication.  Recommendation: I suspect the patient has esophageal dysmotility (? Achalasia, ? poor upper esophageal sphincter relaxation) with an intact gastric wrap associated with her Nissen fundoplication surgery. If her esophageal dysphagia persists, I recommend performing a  high-resolution esophageal manometry

## 2014-06-29 NOTE — H&P (Signed)
  Problem: Esophageal dysphagia post Nissen fundoplication surgery  History: The patient is an 80 year old female born 03-03-1926. She has undergone Nissen fundoplication surgery. On 10/10/2009, she underwent a normal esophagogastroduodenoscopy.  When she developed esophageal dysphagia, the patient underwent a barium esophagram on 05/31/2014 which showed narrowing in the distal esophagus, erosions in the mid esophagus, and absent primary esophageal peristalsis. The barium tablet would not pass the esophagogastric junction and enter the stomach.  The patient is scheduled to undergo diagnostic esophagogastroduodenoscopy with possible esophageal dilation.  Past medical history: Diet controlled type 2 diabetes mellitus. Hypercholesterolemia. Osteoporosis. Atrial fibrillation. Diverticular colonic bleeds. Nissen fundoplication surgery. Cardiac pacemaker for sick sinus syndrome. Vaginal hysterectomy. Bladder tacking surgery. Left hip fracture surgery. Kyphoplasty.  Medication allergies: Aspirin causes nausea. Sulfa drugs cause nausea. Codeine causes nausea. ACE inhibitors cause cough. Tramadol causes a dry mouth. Atorvastatin causes swelling of the legs. WelChol causes hives.  Exam: The patient is alert and lying comfortably on the endoscopy stretcher. Abdomen is soft and nontender to palpation. Lungs are clear to auscultation. Cardiac exam reveals a regular rhythm despite her history of atrial fibrillation. I will determine her cardiac rhythm when she is placed on cardiac monitoring for the procedure.  Plan: Proceed with diagnostic esophagogastroduodenoscopy with possible esophageal stricture dilation.

## 2014-06-30 ENCOUNTER — Encounter (HOSPITAL_COMMUNITY): Payer: Self-pay | Admitting: Gastroenterology

## 2014-06-30 ENCOUNTER — Ambulatory Visit: Payer: Medicare Other | Admitting: Endocrinology

## 2014-07-07 ENCOUNTER — Encounter: Payer: Self-pay | Admitting: Internal Medicine

## 2014-07-07 ENCOUNTER — Encounter: Payer: Self-pay | Admitting: Endocrinology

## 2014-07-07 ENCOUNTER — Ambulatory Visit (INDEPENDENT_AMBULATORY_CARE_PROVIDER_SITE_OTHER): Payer: Medicare Other | Admitting: Endocrinology

## 2014-07-07 VITALS — BP 116/62 | HR 67 | Temp 98.1°F | Resp 14 | Ht 61.0 in | Wt 130.4 lb

## 2014-07-07 DIAGNOSIS — E236 Other disorders of pituitary gland: Secondary | ICD-10-CM

## 2014-07-07 DIAGNOSIS — E871 Hypo-osmolality and hyponatremia: Secondary | ICD-10-CM

## 2014-07-07 DIAGNOSIS — E222 Syndrome of inappropriate secretion of antidiuretic hormone: Secondary | ICD-10-CM

## 2014-07-07 NOTE — Patient Instructions (Signed)
Limit fluids to over 5 of 8 oz glasses daily

## 2014-07-07 NOTE — Progress Notes (Signed)
Patient ID: [name], female   DOB: 02-10-1926, 80 y.o.   MRN: [id number]   Chief complaint: Evaluation of low sodium  Referring physician: Dr. Brigitte Pulse  History of Present Illness:  She thinks she had been told by her primary care physician several years ago that she had a low sodium level and was told to increase her salt intake Review of her records indicates that her sodium level has been mildly decreased since at least 2011 Her lowest sodium was 125 in 08/2013 when she was admitted for hip fracture This was improved to 131 at discharge but no specific evaluation was done at that time Her sodium was down to 129 in 7/14 However her sodium level on 06/29/14 is back to 134  The patient appears to be asymptomatic with no change in her appetite, no nausea, unusual weight loss, drowsiness or fatigue She did lose weight after her hip surgery in 10/14, previous weight was 145 pounds. She was also in a nursing home for some time Review of her medications indicate that she has been on amiodarone probably since 2009, currently taking 100 mg She has not been on any thiazide diuretics, SSRI antidepressants. She has been on hydrocodone for hip and back pain since at least 08/2013 and she is taking 3-4 tablets a day for pain relief Review of her fluid intake indicates that she is probably drinking 4-5 glasses of fluids a day including some water with her medications and one cup each day of milk, coffee, juice at various times  Recent evaluation on 06/10/14 with urine osmolality studies show serum osmolality 265 and urine osmolality 318, random urine sodium 55   Past Medical History  Diagnosis Date  . Hyperlipidemia   . Allergy   . Osteoporosis   . History of sick sinus syndrome 03/2008    s/p PPM  . Diverticulosis of colon with hemorrhage   . Diastolic dysfunction   . Unspecified vitamin D deficiency   . Asymptomatic varicose veins   . Esophageal reflux   . Mixed  hyperlipidemia   . Iron deficiency anemia secondary to blood loss (chronic)   . Osteoarthritis   . Skin cancer of nose     Removed by derm  . Hypothyroidism   . Venous stasis dermatitis     Encouraged to wear compression stockings  . CKD (chronic kidney disease) stage 2, GFR 60-89 ml/min   . Neuropathy     Associated with another disease  . Diabetes mellitus with renal manifestation     AODM  . Hypertension     Significant WCS  . Benign hypertension with chronic kidney disease, stage III   . History of GI diverticular bleed 11/2011    ASA stopped  . GERD (gastroesophageal reflux disease)     s/p Nissen Repair, Dr. Hassell Done  . Fracture of T11 vertebra 2010    Compression fracture  . Dizziness     When getting up from a sitting position, no recent falls  . Leg edema   . Afib     No Coumadin secondary to history of GI bleed.Marland Kitchen ECHO 12/04/11 LVEF estimated by 2D at 60-65%.    Past Surgical History  Procedure Laterality Date  . Pacemaker insertion  03/29/08    For SSS. SJM Zephyr XL DR Implanted by Dr. Leonia Reeves.  . Nissen fundoplication    . Laparoscopic reduction of intrathoracic stomach and repair of hiatal hernia    . Kyphosis surgery  2010 & 2012  x3  . Hip arthroplasty Left 08/22/2013    Procedure: ARTHROPLASTY BIPOLAR HIP;  Surgeon: Mauri Pole, MD;  Location: [facility];  Service: Orthopedics;  Laterality: Left;  . Hiatal hernia repair    . Cystocele repair      x 2  . Tonsillectomy    . Joint replacement      left hip  . Eye surgery      bilateral cataracts with lens implants  . Vaginal hysterectomy      partial  . Esophagogastroduodenoscopy (egd) with propofol N/A 06/29/2014    Procedure: ESOPHAGOGASTRODUODENOSCOPY (EGD) WITH PROPOFOL;  Surgeon: Garlan Fair, MD;  Location: [facility];  Service: Endoscopy;  Laterality: N/A;  . Balloon dilation N/A 06/29/2014    Procedure: BALLOON DILATION;  Surgeon: Garlan Fair, MD;  Location: [facility];  Service:  Endoscopy;  Laterality: N/A;    Family History  Problem Relation Age of Onset  . Colon cancer Mother   . CAD Father   . CAD Sister     Social History:  reports that she quit smoking about 35 years ago. Her smoking use included Cigarettes. She smoked 0.00 packs per day. She has never used smokeless tobacco. She reports that she does not drink alcohol or use illicit drugs.  Allergies:  Allergies  Allergen Reactions  . Ace Inhibitors Cough  . Aspirin Nausea Only  . Sulfa Drugs Cross Reactors Nausea Only  . Atorvastatin Other (See Comments)    Leg swelling  . Welchol [Colesevelam Hcl] Hives      Medication List       This list is accurate as of: 07/07/14  4:45 PM.  Always use your most recent med list.               amiodarone 200 MG tablet  Commonly known as:  PACERONE  Take 0.5 tablets (100 mg total) by mouth every morning.     CALCIUM 600 + D PO  Take 1 tablet by mouth daily.     cholecalciferol 1000 UNITS tablet  Commonly known as:  VITAMIN D  Take 1,000 Units by mouth daily.     Fish Oil 1000 MG Caps  Take 100 mg by mouth daily.     furosemide 40 MG tablet  Commonly known as:  LASIX  Take 40 mg by mouth daily.     HYDROcodone-acetaminophen 7.5-325 MG per tablet  Commonly known as:  NORCO  Take 1-2 tablets by mouth every 6 (six) hours as needed for moderate pain.     levothyroxine 50 MCG tablet  Commonly known as:  SYNTHROID, LEVOTHROID  Take 50 mcg by mouth daily before breakfast.     losartan 100 MG tablet  Commonly known as:  COZAAR  Take 100 mg by mouth every morning.     MATZIM LA 240 MG 24 hr tablet  Generic drug:  diltiazem  Take 240 mg by mouth every morning.     metoprolol 200 MG 24 hr tablet  Commonly known as:  TOPROL-XL  Take 200 mg by mouth every morning.     multivitamin with minerals Tabs tablet  Take 1 tablet by mouth daily.     pravastatin 40 MG tablet  Commonly known as:  PRAVACHOL  Take 40 mg by mouth daily.         LABS:  No visits with results within 1 Week(s) from this visit. Latest known visit with results is:  Admission on 06/29/2014, Discharged on 06/29/2014  Component Date  Value Ref Range Status  . Sodium 06/29/2014 134* 137 - 147 mEq/L Final  . Potassium 06/29/2014 4.5  3.7 - 5.3 mEq/L Final  . Chloride 06/29/2014 95* 96 - 112 mEq/L Final  . CO2 06/29/2014 24  19 - 32 mEq/L Final  . Glucose, Bld 06/29/2014 110* 70 - 99 mg/dL Final  . BUN 06/29/2014 12  6 - 23 mg/dL Final  . Creatinine, Ser 06/29/2014 0.72  0.50 - 1.10 mg/dL Final  . Calcium 06/29/2014 9.8  8.4 - 10.5 mg/dL Final  . GFR calc non Af Amer 06/29/2014 76* >90 mL/min Final  . GFR calc Af Amer 06/29/2014 88* >90 mL/min Final   Comment: (NOTE)                          The eGFR has been calculated using the CKD EPI equation.                          This calculation has not been validated in all clinical situations.                          eGFR's persistently <90 mL/min signify possible Chronic Kidney                          Disease.  . Anion gap 06/29/2014 15  5 - 15 Final  . Glucose-Capillary 06/29/2014 98  70 - 99 mg/dL Final     REVIEW OF SYSTEMS:           No unusual headaches.       Skin: No rash recently     Thyroid:  No cold or heat intolerance, unusual fatigue recently.   She has been told to be hypothyroid for about 2 years. Her dose was increased to 50 mcg in 08/2013 Recent TSH from PCP was normal at 2.4  Lab Results  Component Value Date   TSH 4.317 08/22/2013   TSH 6.377* 08/21/2013        His blood pressure has been controlled with metoprolol, losartan and diltiazem.     No swelling of feet.     No shortness of breath or chest pain on exertion. Her chest x-ray in 7/14 showed no significant abnormality except some interstitial markings and mild COPD   Has a history of atrial arrhythmias and is on amiodarone long-term, also had pacemaker placement  Has been on a statin for  hypercholesterolemia  She has a history of mild diabetes, currently not on any medications and last A1c 6.6%      No abdominal pain.  Bowel habits:  No change.         No frequency of urination or excessive nocturia        She has hip and low back pains.     She has a history of multiple vertebral fractures treated with kyphoplasty, not clear if she has had any evaluation or treatment for osteoporosis      No  depression  history   PHYSICAL EXAM:  BP 116/62  Pulse 67  Temp(Src) 98.1 F (36.7 C)  Resp 14  Ht _0  (1.549 m)  Wt 130 lb 6.4 oz (59.149 kg)  BMI 24.65 kg/m2  SpO2 96%  GENERAL: Averagely built and nourished, pleasant and oriented   No pallor, lymphadenopathy or edema.   she has minimal clubbing  present. Skin:  no rash or pigmentation.  EYES:  Externally normal.   ENT: Oral mucosa and tongue normal.  THYROID:  Not palpable.  HEART:  Normal  S1 and S2; no murmur or click.  CHEST:   Lungs: Vescicular breath sounds heard equally.  No crepitations/ wheeze.  ABDOMEN:  No distention.  Liver and spleen not palpable.  No other mass or tenderness.  NEUROLOGICAL: .Reflexes are bilaterally at ankles.  JOINTS:  Normal. 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   Her sodium has been relatively low since at least 2011 from her records  Most likely this is related to her taking narcotic pain medications on a regular basis She has been asymptomatic Her hyponatremia has been generally mild but not clear why she had a worsening of her level down to 129 in July This appears to have spontaneously improved although she was told by her PCP to restrict water intake   PLAN:    Since her recent sodium is much better at 134 she does not need any specific treatment or further evaluation She is unable to reduce her hydrocortisone because of continued musculoskeletal pains Discussed with her that she should  try and restrict her fluid intake as much as possible to no more than 5 glasses a day She does not need to alter her dose of Lasix since it is not a thiazide diuretic and does not cause SIADH Although rarely amiodarone can cause hyponatremia it is unlikely to be doing so in her case since she has been taking this since 2009 or so She will continue to followup with PCP  [facility] 07/07/2014, 4:45 PM

## 2014-08-06 ENCOUNTER — Encounter: Payer: Self-pay | Admitting: *Deleted

## 2014-08-30 ENCOUNTER — Encounter: Payer: Self-pay | Admitting: Internal Medicine

## 2014-08-30 ENCOUNTER — Ambulatory Visit (INDEPENDENT_AMBULATORY_CARE_PROVIDER_SITE_OTHER): Payer: Medicare Other | Admitting: *Deleted

## 2014-08-30 DIAGNOSIS — I495 Sick sinus syndrome: Secondary | ICD-10-CM

## 2014-08-30 DIAGNOSIS — I480 Paroxysmal atrial fibrillation: Secondary | ICD-10-CM

## 2014-08-30 LAB — MDC_IDC_ENUM_SESS_TYPE_INCLINIC
Brady Statistic RA Percent Paced: 100 %
Date Time Interrogation Session: 20151019145209
Implantable Pulse Generator Serial Number: 2117330
Lead Channel Impedance Value: 582 Ohm
Lead Channel Pacing Threshold Amplitude: 0.75 V
Lead Channel Pacing Threshold Pulse Width: 0.4 ms
Lead Channel Sensing Intrinsic Amplitude: 1.2 mV
Lead Channel Sensing Intrinsic Amplitude: 12 mV
Lead Channel Setting Pacing Amplitude: 2 V
Lead Channel Setting Pacing Amplitude: 2.5 V
Lead Channel Setting Pacing Pulse Width: 0.4 ms
Lead Channel Setting Sensing Sensitivity: 2.5 mV
MDC IDC MSMT BATTERY IMPEDANCE: 1400 Ohm
MDC IDC MSMT BATTERY VOLTAGE: 2.78 V
MDC IDC MSMT LEADCHNL RA IMPEDANCE VALUE: 331 Ohm
MDC IDC MSMT LEADCHNL RA PACING THRESHOLD AMPLITUDE: 0.5 V
MDC IDC MSMT LEADCHNL RA PACING THRESHOLD PULSEWIDTH: 0.4 ms
MDC IDC STAT BRADY RV PERCENT PACED: 36 %

## 2014-08-30 NOTE — Progress Notes (Signed)
Pacemaker check in clinic. Normal device function. Thresholds, sensing, impedances consistent with previous measurements. Device programmed to maximize longevity. 69 mode switches the longest 1:52 minutes, - coumadin.  Max A 274 bpm, no EGM's available.  Total burden <1%.  No high ventricular rates noted. Device programmed at appropriate safety margins. Histogram distribution appropriate for patient activity level. Device programmed to optimize intrinsic conduction. Estimated longevity 4.25 years. Patient enrolled in remote follow-up/TTM's with [facility]. Plan to follow every 3 months remotely and see annually in office. Patient education completed.  ROV 6 months with Dr. Rayann Heman.

## 2014-09-21 ENCOUNTER — Other Ambulatory Visit: Payer: Self-pay | Admitting: Orthopedic Surgery

## 2014-09-21 DIAGNOSIS — M5441 Lumbago with sciatica, right side: Secondary | ICD-10-CM

## 2014-09-22 ENCOUNTER — Ambulatory Visit
Admission: RE | Admit: 2014-09-22 | Discharge: 2014-09-22 | Disposition: A | Discharge status: Home / Self Care | Payer: Medicare Other | Source: Ambulatory Visit | Attending: Orthopedic Surgery | Admitting: Orthopedic Surgery

## 2014-09-22 DIAGNOSIS — M5441 Lumbago with sciatica, right side: Secondary | ICD-10-CM

## 2014-10-29 ENCOUNTER — Other Ambulatory Visit: Payer: Self-pay

## 2014-10-29 MED ORDER — AMIODARONE HCL 200 MG PO TABS: 100.0000 mg | ORAL_TABLET | Freq: Every morning | ORAL | Status: DC

## 2014-12-20 ENCOUNTER — Ambulatory Visit (INDEPENDENT_AMBULATORY_CARE_PROVIDER_SITE_OTHER): Payer: Medicare Other | Admitting: Internal Medicine

## 2014-12-20 ENCOUNTER — Encounter: Payer: Self-pay | Admitting: Internal Medicine

## 2014-12-20 VITALS — BP 146/70 | HR 68 | Ht 61.0 in | Wt 132.2 lb

## 2014-12-20 DIAGNOSIS — I10 Essential (primary) hypertension: Secondary | ICD-10-CM

## 2014-12-20 DIAGNOSIS — R609 Edema, unspecified: Secondary | ICD-10-CM

## 2014-12-20 DIAGNOSIS — I495 Sick sinus syndrome: Secondary | ICD-10-CM

## 2014-12-20 DIAGNOSIS — I480 Paroxysmal atrial fibrillation: Secondary | ICD-10-CM

## 2014-12-20 LAB — MDC_IDC_ENUM_SESS_TYPE_INCLINIC
Battery Voltage: 2.78 V
Brady Statistic RA Percent Paced: 99 %
Brady Statistic RV Percent Paced: 16 %
Date Time Interrogation Session: 20160208142132
Implantable Pulse Generator Model: 5826
Lead Channel Impedance Value: 358 Ohm
Lead Channel Impedance Value: 616 Ohm
Lead Channel Pacing Threshold Amplitude: 0.5 V
Lead Channel Pacing Threshold Amplitude: 1 V
Lead Channel Sensing Intrinsic Amplitude: 12 mV
Lead Channel Setting Pacing Amplitude: 2 V
MDC IDC MSMT BATTERY IMPEDANCE: 1500 Ohm
MDC IDC MSMT LEADCHNL RA PACING THRESHOLD PULSEWIDTH: 0.4 ms
MDC IDC MSMT LEADCHNL RA SENSING INTR AMPL: 1.1 mV
MDC IDC MSMT LEADCHNL RV PACING THRESHOLD PULSEWIDTH: 0.4 ms
MDC IDC PG SERIAL: 2117330
MDC IDC SET LEADCHNL RV PACING AMPLITUDE: 2.5 V
MDC IDC SET LEADCHNL RV PACING PULSEWIDTH: 0.4 ms
MDC IDC SET LEADCHNL RV SENSING SENSITIVITY: 2.5 mV

## 2014-12-20 MED ORDER — DILTIAZEM HCL ER COATED BEADS 120 MG PO CP24: 120.0000 mg | ORAL_CAPSULE | Freq: Every day | ORAL | Status: DC

## 2014-12-20 NOTE — Patient Instructions (Addendum)
Your physician recommends that you schedule a follow-up appointment in: 3 months with [facility], 6 months with the [facility], and 12 months with Chanetta Marshall, NP  Your physician has recommended you make the following change in your medication:  1) Decrease Cardizem to 120mg  daily

## 2014-12-20 NOTE — Progress Notes (Signed)
Electrophysiology Office Note   Date:  12/20/2014   ID:  [name], DOB Nov 22, 1925, MRN [id number]  PCP:  Mayra Neer, MD  Cardiologist:  Dr Irish Lack Primary Electrophysiologist: Thompson Grayer, MD    Chief Complaint  Patient presents with  . Edema     History of Present Illness: [name] is a 81 y.o. female who presents today for electrophysiology evaluation.   She has done well since her last visit.  She remains active for her age.  She has had no symptoms of afib.  She has stable edema.  Today, she denies symptoms of palpitations, chest pain, shortness of breath, orthopnea, PND,  claudication, dizziness, presyncope, syncope, bleeding, or neurologic sequela. The patient is tolerating medications without difficulties and is otherwise without complaint today.    Past Medical History  Diagnosis Date  . Hyperlipidemia   . Allergy   . Osteoporosis   . History of sick sinus syndrome 03/2008    s/p PPM  . Diverticulosis of colon with hemorrhage   . Diastolic dysfunction   . Unspecified vitamin D deficiency   . Asymptomatic varicose veins   . Esophageal reflux   . Mixed hyperlipidemia   . Iron deficiency anemia secondary to blood loss (chronic)   . Osteoarthritis   . Skin cancer of nose     Removed by derm  . Hypothyroidism   . Venous stasis dermatitis     Encouraged to wear compression stockings  . CKD (chronic kidney disease) stage 2, GFR 60-89 ml/min   . Neuropathy     Associated with another disease  . Diabetes mellitus with renal manifestation     AODM  . Hypertension     Significant WCS  . Benign hypertension with chronic kidney disease, stage III   . History of GI diverticular bleed 11/2011    ASA stopped  . GERD (gastroesophageal reflux disease)     s/p Nissen Repair, Dr. Hassell Done  . Fracture of T11 vertebra 2010    Compression fracture  . Dizziness     When getting up from a sitting position, no recent falls  . Leg edema   . Afib     No  Coumadin secondary to history of GI bleed.Marland Kitchen ECHO 12/04/11 LVEF estimated by 2D at 60-65%.   Past Surgical History  Procedure Laterality Date  . Pacemaker insertion  03/29/08    For SSS. SJM Zephyr XL DR Implanted by Dr. Leonia Reeves.  . Nissen fundoplication    . Laparoscopic reduction of intrathoracic stomach and repair of hiatal hernia    . Kyphosis surgery  2010 & 2012    x3  . Hip arthroplasty Left 08/22/2013    Procedure: ARTHROPLASTY BIPOLAR HIP;  Surgeon: Mauri Pole, MD;  Location: [facility];  Service: Orthopedics;  Laterality: Left;  . Hiatal hernia repair    . Cystocele repair      x 2  . Tonsillectomy    . Joint replacement      left hip  . Eye surgery      bilateral cataracts with lens implants  . Vaginal hysterectomy      partial  . Esophagogastroduodenoscopy (egd) with propofol N/A 06/29/2014    Procedure: ESOPHAGOGASTRODUODENOSCOPY (EGD) WITH PROPOFOL;  Surgeon: Garlan Fair, MD;  Location: [facility];  Service: Endoscopy;  Laterality: N/A;  . Balloon dilation N/A 06/29/2014    Procedure: BALLOON DILATION;  Surgeon: Garlan Fair, MD;  Location: [facility];  Service: Endoscopy;  Laterality: N/A;  Current Outpatient Prescriptions  Medication Sig Dispense Refill  . amiodarone (PACERONE) 200 MG tablet Take 0.5 tablets (100 mg total) by mouth every morning. 45 tablet 1  . Calcium Carbonate-Vitamin D (CALCIUM 600 + D PO) Take 1 tablet by mouth daily.    . cetirizine (ZYRTEC) 10 MG tablet Take 10 mg by mouth at bedtime.    . cholecalciferol (VITAMIN D) 1000 UNITS tablet Take 1,000 Units by mouth daily.    Marland Kitchen diltiazem (CARDIZEM CD) 240 MG 24 hr capsule Take 1 capsule by mouth daily.  2  . furosemide (LASIX) 40 MG tablet Take 40 mg by mouth daily.     Marland Kitchen HYDROcodone-acetaminophen (NORCO) 10-325 MG per tablet Take 1 tablet by mouth every 8 (eight) hours as needed. Back pain  0  . levothyroxine (SYNTHROID, LEVOTHROID) 50 MCG tablet Take 50 mcg by mouth daily before  breakfast.    . losartan (COZAAR) 100 MG tablet Take 100 mg by mouth every morning.    . metoprolol (TOPROL-XL) 200 MG 24 hr tablet Take 200 mg by mouth every morning.     . Multiple Vitamin (MULTIVITAMIN WITH MINERALS) TABS tablet Take 1 tablet by mouth daily.    . Omega-3 Fatty Acids (FISH OIL) 1000 MG CAPS Take 100 mg by mouth daily.    . pravastatin (PRAVACHOL) 40 MG tablet Take 40 mg by mouth daily.     No current facility-administered medications for this visit.    Allergies:   Ace inhibitors; Aspirin; Sulfa drugs cross reactors; Atorvastatin; and Welchol   Social History:  The patient  reports that she quit smoking about 36 years ago. Her smoking use included Cigarettes. She has never used smokeless tobacco. She reports that she does not drink alcohol or use illicit drugs.   Family History:  The patient's family history includes CAD in her father and sister; Colon cancer in her mother.    ROS:  Please see the history of present illness.   All other systems are reviewed and negative.    PHYSICAL EXAM: VS:  BP 146/70 mmHg  Pulse 68  Ht 5\' 1"  (1.549 m)  Wt 132 lb 3.2 oz (59.966 kg)  BMI 24.99 kg/m2 , BMI Body mass index is 24.99 kg/(m^2). GEN: Well nourished, well developed, in no acute distress HEENT: normal Neck: no JVD, carotid bruits, or masses Cardiac: RRR; no murmurs, rubs, or gallops,no edema  Respiratory:  clear to auscultation bilaterally, normal work of breathing GI: soft, nontender, nondistended, + BS MS: no deformity or atrophy Skin: warm and dry, device pocket is well healed Neuro:  Strength and sensation are intact Psych: euthymic mood, full affect  EKG:  EKG is ordered today. The ekg ordered today shows atrial pacing, 68 bpm, PR 232, RBBB  Device interrogation is reviewed today in detail.  See PaceArt for details.   Recent Labs: 06/29/2014: BUN 12; Creatinine 0.72; Potassium 4.5; Sodium 134*    Lipid Panel  No results found for: CHOL, TRIG, HDL,  CHOLHDL, VLDL, LDLCALC, LDLDIRECT   Wt Readings from Last 3 Encounters:  12/20/14 132 lb 3.2 oz (59.966 kg)  07/07/14 130 lb 6.4 oz (59.149 kg)  06/29/14 128 lb (58.06 kg)      Other studies Reviewed: Additional studies/ records that were reviewed today include: Dr Hassell Done note     ASSESSMENT AND PLAN:  1.  Sick sinus Normal pacemaker function See Pace Art report No changes today  2. afib Maintaining sinus rhythm Not felt to be a candidate  for anticoagulation by Dr Irish Lack (last note reviewed)  3. Edema Likely worsened by diltiazem Will reduce from 240mg  daily to 120mg  daily today Consider stopping diltiazem upon follow-up if she is still in sinus  4. htn Stable Sodium restriction  Follow-up with Dr Irish Lack as scheduled Return to the device clinic in 6 months Follow-up with EP NP in 12 months  Signed, Thompson Grayer, MD  12/20/2014 12:38 PM     [place] [address] [place] [address] [phone] (office) [phone] (fax)

## 2014-12-21 ENCOUNTER — Telehealth: Payer: Self-pay | Admitting: Internal Medicine

## 2014-12-21 NOTE — Telephone Encounter (Signed)
New message     presc for cardizem 120mg  was sent in but the pt was expecting cardizem 240mg .  Please call to clarify presc.  Use reference # [id number].

## 2014-12-22 ENCOUNTER — Encounter: Payer: Self-pay | Admitting: Internal Medicine

## 2014-12-28 NOTE — Telephone Encounter (Signed)
Follow Up         Pt calling stating that she got a letter [facility] stating that the new rx that was sent for Cartia did not have the dosage. Please call back and advise.

## 2014-12-29 NOTE — Telephone Encounter (Signed)
F/u ° ° °Pt calling concerning previous message. Please call pt.  °

## 2014-12-30 NOTE — Telephone Encounter (Signed)
Spoke with [facility] and they will send her the 120mg  caps to take daily

## 2015-03-25 ENCOUNTER — Other Ambulatory Visit: Payer: Self-pay

## 2015-03-25 MED ORDER — AMIODARONE HCL 200 MG PO TABS: 100.0000 mg | ORAL_TABLET | Freq: Every morning | ORAL | Status: DC

## 2015-03-28 ENCOUNTER — Other Ambulatory Visit: Payer: Self-pay

## 2015-03-28 DIAGNOSIS — I480 Paroxysmal atrial fibrillation: Secondary | ICD-10-CM

## 2015-03-28 DIAGNOSIS — I495 Sick sinus syndrome: Secondary | ICD-10-CM

## 2015-03-28 MED ORDER — DILTIAZEM HCL ER COATED BEADS 120 MG PO CP24: 120.0000 mg | ORAL_CAPSULE | Freq: Every day | ORAL | Status: DC

## 2015-04-07 ENCOUNTER — Ambulatory Visit (INDEPENDENT_AMBULATORY_CARE_PROVIDER_SITE_OTHER): Payer: Medicare Other | Admitting: Interventional Cardiology

## 2015-04-07 ENCOUNTER — Encounter: Payer: Self-pay | Admitting: Interventional Cardiology

## 2015-04-07 VITALS — BP 170/66 | HR 60 | Ht 63.0 in | Wt 134.4 lb

## 2015-04-07 DIAGNOSIS — I480 Paroxysmal atrial fibrillation: Secondary | ICD-10-CM | POA: Diagnosis not present

## 2015-04-07 DIAGNOSIS — I10 Essential (primary) hypertension: Secondary | ICD-10-CM | POA: Diagnosis not present

## 2015-04-07 NOTE — Progress Notes (Signed)
Patient ID: [name], female   DOB: 08/01/1926, 81 y.o.   MRN: [id number]     Cardiology Office Note   Date:  04/07/2015   ID:  [name], DOB Feb 10, 1926, MRN [id number]  PCP:  Mayra Neer, MD    No chief complaint on file. AFib   Wt Readings from Last 3 Encounters:  04/07/15 134 lb 6.4 oz (60.963 kg)  12/20/14 132 lb 3.2 oz (59.966 kg)  07/07/14 130 lb 6.4 oz (59.149 kg)       History of Present Illness: [name] is a 81 y.o. female  With h/o PAF.  She has had multiple falls due to poor balance.  Her diltiazem was decreased from 240 to 120 daily due to swelling by Dr. Rayann Heman.  THe patient has not been checking her BP at home.  Her BP at other MD appts are high typically.    Swelling improved with less diltiazem.  Had some right sided rib pain after a fall, worse with coughing.  Seems to be improving.  Poor balance.    Past Medical History  Diagnosis Date  . Hyperlipidemia   . Allergy   . Osteoporosis   . History of sick sinus syndrome 03/2008    s/p PPM  . Diverticulosis of colon with hemorrhage   . Diastolic dysfunction   . Unspecified vitamin D deficiency   . Asymptomatic varicose veins   . Esophageal reflux   . Mixed hyperlipidemia   . Iron deficiency anemia secondary to blood loss (chronic)   . Osteoarthritis   . Skin cancer of nose     Removed by derm  . Hypothyroidism   . Venous stasis dermatitis     Encouraged to wear compression stockings  . CKD (chronic kidney disease) stage 2, GFR 60-89 ml/min   . Neuropathy     Associated with another disease  . Diabetes mellitus with renal manifestation     AODM  . Hypertension     Significant WCS  . Benign hypertension with chronic kidney disease, stage III   . History of GI diverticular bleed 11/2011    ASA stopped  . GERD (gastroesophageal reflux disease)     s/p Nissen Repair, Dr. Hassell Done  . Fracture of T11 vertebra 2010    Compression fracture  . Dizziness     When getting up from a  sitting position, no recent falls  . Leg edema   . Afib     No Coumadin secondary to history of GI bleed.Marland Kitchen ECHO 12/04/11 LVEF estimated by 2D at 60-65%.    Past Surgical History  Procedure Laterality Date  . Pacemaker insertion  03/29/08    For SSS. SJM Zephyr XL DR Implanted by Dr. Leonia Reeves.  . Nissen fundoplication    . Laparoscopic reduction of intrathoracic stomach and repair of hiatal hernia    . Kyphosis surgery  2010 & 2012    x3  . Hip arthroplasty Left 08/22/2013    Procedure: ARTHROPLASTY BIPOLAR HIP;  Surgeon: Mauri Pole, MD;  Location: [facility];  Service: Orthopedics;  Laterality: Left;  . Hiatal hernia repair    . Cystocele repair      x 2  . Tonsillectomy    . Joint replacement      left hip  . Eye surgery      bilateral cataracts with lens implants  . Vaginal hysterectomy      partial  . Esophagogastroduodenoscopy (egd) with propofol N/A 06/29/2014    Procedure:  ESOPHAGOGASTRODUODENOSCOPY (EGD) WITH PROPOFOL;  Surgeon: Garlan Fair, MD;  Location: [facility];  Service: Endoscopy;  Laterality: N/A;  . Balloon dilation N/A 06/29/2014    Procedure: BALLOON DILATION;  Surgeon: Garlan Fair, MD;  Location: [facility];  Service: Endoscopy;  Laterality: N/A;     Current Outpatient Prescriptions  Medication Sig Dispense Refill  . amiodarone (PACERONE) 200 MG tablet Take 0.5 tablets (100 mg total) by mouth every morning. 45 tablet 1  . Calcium Carbonate-Vitamin D (CALCIUM 600 + D PO) Take 1 tablet by mouth daily.    . cetirizine (ZYRTEC) 10 MG tablet Take 10 mg by mouth at bedtime.    . cholecalciferol (VITAMIN D) 1000 UNITS tablet Take 1,000 Units by mouth daily.    Marland Kitchen diltiazem (CARDIZEM CD) 120 MG 24 hr capsule Take 1 capsule (120 mg total) by mouth daily. 90 capsule 2  . furosemide (LASIX) 40 MG tablet Take 40 mg by mouth daily.     Marland Kitchen HYDROcodone-acetaminophen (NORCO) 10-325 MG per tablet Take 1 tablet by mouth every 8 (eight) hours as needed. Back pain  0    . levothyroxine (SYNTHROID, LEVOTHROID) 50 MCG tablet Take 50 mcg by mouth daily before breakfast.    . losartan (COZAAR) 100 MG tablet Take 100 mg by mouth every morning.    . metoprolol (TOPROL-XL) 200 MG 24 hr tablet Take 200 mg by mouth every morning.     . Multiple Vitamin (MULTIVITAMIN WITH MINERALS) TABS tablet Take 1 tablet by mouth daily.    . Omega-3 Fatty Acids (FISH OIL) 1000 MG CAPS Take 100 mg by mouth daily.    . pravastatin (PRAVACHOL) 40 MG tablet Take 40 mg by mouth daily.     No current facility-administered medications for this visit.    Allergies:   Ace inhibitors; Aspirin; Sulfa drugs cross reactors; Atorvastatin; and Welchol    Social History:  The patient  reports that she quit smoking about 36 years ago. Her smoking use included Cigarettes. She has never used smokeless tobacco. She reports that she does not drink alcohol or use illicit drugs.   Family History:  The patient's *family history includes CAD in her father and sister; Colon cancer in her mother.    ROS:  Please see the history of present illness.   Otherwise, review of systems are positive for poor balance, with recurrent recent falls; right rib pain from where she fell.   All other systems are reviewed and negative.    PHYSICAL EXAM: VS:  BP 170/66 mmHg  Pulse 60  Ht 5\' 3"  (1.6 m)  Wt 134 lb 6.4 oz (60.963 kg)  BMI 23.81 kg/m2  SpO2 95% , BMI Body mass index is 23.81 kg/(m^2).  BP recheck 134/60 GEN: Well nourished, well developed, in no acute distress HEENT: normal Neck: no JVD, carotid bruits, or masses Cardiac: RRR; no murmurs, rubs, or gallops,no edema  Respiratory:  clear to auscultation bilaterally, normal work of breathing GI: soft, nontender, nondistended, + BS MS: no deformity or atrophy Skin: warm and dry, no rash Neuro:  Strength and sensation are intact Psych: euthymic mood, full affect    Recent Labs: 06/29/2014: BUN 12; Creatinine 0.72; Potassium 4.5; Sodium 134*   Lipid  Panel No results found for: CHOL, TRIG, HDL, CHOLHDL, VLDL, LDLCALC, LDLDIRECT   Other studies Reviewed: Additional studies/ records that were reviewed today with results demonstrating: prior EP note.   ASSESSMENT AND PLAN:  Continue Pacerone Tablet, 200 MG, 1/2 tablet, Orally,  Once a day       Notes: Unable to tolerate anticoagulation due to GI bleed. No palpitations. Maintaining regular rhythm. She had been difficult to rate control in the past. Given her October 2014 fall, I would not consider anticoagulation. Maintaining sinus rhythm. WOuld not stop diltiazem due to HTN at this time.  She will f/u with Dr. Brigitte Pulse in a month.  If BP still high then, she will need additional medication,.    2. Mixed hyperlipidemia  Continue Fish Oil Capsule, 1000 MG, 1 capsule with a meal, Orally, once a day Continue Pravastatin Sodium Tablet, 40 MG, 1 tablet, Orally, Once a day Notes: LDL 108, 113 at last two checks. LDL 95 in Jan 2015.  Followed by Dr. Brigitte Pulse.   3. Cardiac pacemaker in situ  Notes: Continue pacer checks.    4. Essential hypertension, benign  Continue Diltiazem HCl Coated Beads Tablet Extended Release 24 Hour, 120 MG, 1 tablet at the same time each day, Orally, Once a day Continue Toprol XL Tablet Extended Release 24 Hour, 200 MG, 1 tablet, Orally, Once a day Continue Losartan Potassium Tablet, 100 MG, 1 tablet, Orally, Once a day Notes: Tolerating Losartan. She was conccerned about the dye used in the tablet last year, but this is now tolerated well. BP controlled as well.  WOuld not stop diltiazem due to HTN at this time.  She will f/u with Dr. Brigitte Pulse in a month.   If BP still high then, she will need additional medication,.   BP recheck today much better.             Current medicines are reviewed at length with the patient today.  The patient concerns regarding her medicines were addressed.  The following changes have been made:  No change  Labs/ tests ordered today  include:  No orders of the defined types were placed in this encounter.    Recommend 150 minutes/week of aerobic exercise Low fat, low carb, high fiber diet recommended  Disposition:   FU in 1 year   Teresita Madura., MD  04/07/2015 11:41 AM    [facility] [place], [place], [place]  [address] Phone: [phone]; Fax: [phone]

## 2015-04-07 NOTE — Patient Instructions (Signed)
Medication Instructions:  Same-no change  Labwork: None  Testing/Procedures: None  Follow-Up: Your physician wants you to follow-up in: 1 year. You will receive a reminder letter in the mail two months in advance. If you don't receive a letter, please call our office to schedule the follow-up appointment.      

## 2015-06-29 ENCOUNTER — Other Ambulatory Visit: Payer: Self-pay | Admitting: Family Medicine

## 2015-06-29 DIAGNOSIS — R1031 Right lower quadrant pain: Secondary | ICD-10-CM

## 2015-06-30 ENCOUNTER — Ambulatory Visit
Admission: RE | Admit: 2015-06-30 | Discharge: 2015-06-30 | Disposition: A | Discharge status: Home / Self Care | Payer: Medicare Other | Source: Ambulatory Visit | Attending: Family Medicine | Admitting: Family Medicine

## 2015-06-30 DIAGNOSIS — R1031 Right lower quadrant pain: Secondary | ICD-10-CM

## 2015-06-30 MED ORDER — IOPAMIDOL (ISOVUE-300) INJECTION 61%
100.0000 mL | Freq: Once | INTRAVENOUS | Status: AC | PRN
  Administered 2015-06-30: 100 mL via INTRAVENOUS

## 2015-07-12 DIAGNOSIS — R001 Bradycardia, unspecified: Secondary | ICD-10-CM | POA: Diagnosis not present

## 2015-07-13 ENCOUNTER — Encounter: Payer: Self-pay | Admitting: *Deleted

## 2015-08-15 ENCOUNTER — Ambulatory Visit (INDEPENDENT_AMBULATORY_CARE_PROVIDER_SITE_OTHER): Payer: Medicare Other | Admitting: *Deleted

## 2015-08-15 DIAGNOSIS — I495 Sick sinus syndrome: Secondary | ICD-10-CM

## 2015-08-15 DIAGNOSIS — Z950 Presence of cardiac pacemaker: Secondary | ICD-10-CM | POA: Diagnosis not present

## 2015-08-15 LAB — CUP PACEART INCLINIC DEVICE CHECK
Battery Voltage: 2.78 V
Brady Statistic RA Percent Paced: 99 %
Brady Statistic RV Percent Paced: 17 %
Lead Channel Impedance Value: 331 Ohm
Lead Channel Pacing Threshold Amplitude: 0.5 V
Lead Channel Pacing Threshold Amplitude: 1 V
Lead Channel Pacing Threshold Pulse Width: 0.4 ms
Lead Channel Pacing Threshold Pulse Width: 0.4 ms
Lead Channel Sensing Intrinsic Amplitude: 12 mV
Lead Channel Setting Pacing Amplitude: 2 V
Lead Channel Setting Pacing Amplitude: 2.5 V
MDC IDC MSMT BATTERY IMPEDANCE: 1800 Ohm
MDC IDC MSMT LEADCHNL RA SENSING INTR AMPL: 0.9 mV
MDC IDC MSMT LEADCHNL RV IMPEDANCE VALUE: 582 Ohm
MDC IDC SESS DTM: 20161003120001
MDC IDC SET LEADCHNL RV PACING PULSEWIDTH: 0.4 ms
MDC IDC SET LEADCHNL RV SENSING SENSITIVITY: 2.5 mV
Pulse Gen Model: 5826
Pulse Gen Serial Number: 2117330

## 2015-08-15 NOTE — Progress Notes (Signed)
Pacemaker check in clinic. Normal device function. Thresholds, sensing, impedances consistent with previous measurements. Device programmed to maximize longevity. No mode switch or high ventricular rates noted. Device programmed at appropriate safety margins---changed RA sensitivity from 0.5 to 0.26mV. Histogram distribution appropriate for patient activity level. Device programmed to optimize intrinsic conduction. Estimated longevity 3.75-4.50yrs. ROV w/ AS in 61mo.

## 2015-09-02 ENCOUNTER — Encounter: Payer: Self-pay | Admitting: Internal Medicine

## 2015-09-09 ENCOUNTER — Other Ambulatory Visit: Payer: Self-pay

## 2015-09-09 MED ORDER — AMIODARONE HCL 200 MG PO TABS: 100.0000 mg | ORAL_TABLET | Freq: Every morning | ORAL | Status: DC

## 2015-10-11 DIAGNOSIS — I495 Sick sinus syndrome: Secondary | ICD-10-CM | POA: Diagnosis not present

## 2015-11-30 ENCOUNTER — Other Ambulatory Visit (HOSPITAL_COMMUNITY): Payer: Self-pay | Admitting: Physician Assistant

## 2015-11-30 ENCOUNTER — Ambulatory Visit (HOSPITAL_COMMUNITY)
Admission: RE | Admit: 2015-11-30 | Discharge: 2015-11-30 | Disposition: A | Discharge status: Home / Self Care | Payer: Medicare Other | Source: Ambulatory Visit | Attending: Urology | Admitting: Urology

## 2015-11-30 DIAGNOSIS — I129 Hypertensive chronic kidney disease with stage 1 through stage 4 chronic kidney disease, or unspecified chronic kidney disease: Secondary | ICD-10-CM | POA: Insufficient documentation

## 2015-11-30 DIAGNOSIS — E782 Mixed hyperlipidemia: Secondary | ICD-10-CM | POA: Diagnosis not present

## 2015-11-30 DIAGNOSIS — M7989 Other specified soft tissue disorders: Secondary | ICD-10-CM | POA: Insufficient documentation

## 2015-11-30 DIAGNOSIS — N183 Chronic kidney disease, stage 3 (moderate): Secondary | ICD-10-CM | POA: Insufficient documentation

## 2015-11-30 DIAGNOSIS — E1122 Type 2 diabetes mellitus with diabetic chronic kidney disease: Secondary | ICD-10-CM | POA: Insufficient documentation

## 2015-11-30 DIAGNOSIS — R52 Pain, unspecified: Secondary | ICD-10-CM | POA: Diagnosis not present

## 2015-11-30 DIAGNOSIS — M79605 Pain in left leg: Secondary | ICD-10-CM | POA: Insufficient documentation

## 2016-01-25 ENCOUNTER — Encounter: Payer: Self-pay | Admitting: Nurse Practitioner

## 2016-01-25 ENCOUNTER — Ambulatory Visit (INDEPENDENT_AMBULATORY_CARE_PROVIDER_SITE_OTHER): Payer: Medicare Other | Admitting: Nurse Practitioner

## 2016-01-25 VITALS — BP 150/74 | HR 69 | Ht 63.0 in | Wt 132.6 lb

## 2016-01-25 DIAGNOSIS — I4819 Other persistent atrial fibrillation: Secondary | ICD-10-CM

## 2016-01-25 DIAGNOSIS — I481 Persistent atrial fibrillation: Secondary | ICD-10-CM

## 2016-01-25 DIAGNOSIS — I10 Essential (primary) hypertension: Secondary | ICD-10-CM

## 2016-01-25 DIAGNOSIS — I495 Sick sinus syndrome: Secondary | ICD-10-CM | POA: Diagnosis not present

## 2016-01-25 LAB — CUP PACEART INCLINIC DEVICE CHECK
Date Time Interrogation Session: 20170315133835
Implantable Lead Implant Date: 20090518
Implantable Lead Implant Date: 20090518
Implantable Lead Location: 753860
MDC IDC LEAD LOCATION: 753859
MDC IDC PG SERIAL: 2117330

## 2016-01-25 LAB — HEPATIC FUNCTION PANEL
ALT: 10 U/L (ref 6–29)
AST: 17 U/L (ref 10–35)
Albumin: 4.4 g/dL (ref 3.6–5.1)
Alkaline Phosphatase: 62 U/L (ref 33–130)
BILIRUBIN DIRECT: 0.1 mg/dL (ref <=0.2)
BILIRUBIN TOTAL: 0.5 mg/dL (ref 0.2–1.2)
Indirect Bilirubin: 0.4 mg/dL (ref 0.2–1.2)
Total Protein: 6.8 g/dL (ref 6.1–8.1)

## 2016-01-25 MED ORDER — AMIODARONE HCL 200 MG PO TABS: 200.0000 mg | ORAL_TABLET | Freq: Every day | ORAL | Status: DC

## 2016-01-25 NOTE — Patient Instructions (Signed)
Medication Instructions:   START TAKING AMIODARONE 200 MG ONCE A DAY    If you need a refill on your cardiac medications before your next appointment, please call your pharmacy.  Labwork:  LFT AND TSH    Testing/Procedures: NONE ORDER TODAY    Follow-Up:  6 MONTHS WITH DEVICE CLINIC   Your physician wants you to follow-up in: [place] will receive a reminder letter in the mail two months in advance. If you don't receive a letter, please call our office to schedule the follow-up appointment.    Any Other Special Instructions Will Be Listed Below (If Applicable).

## 2016-01-25 NOTE — Progress Notes (Signed)
Electrophysiology Office Note Date: 01/25/2016  ID:  [name], DOB [birth year], MRN [id number]  PCP: Mayra Neer, MD Primary Cardiologist: Irish Lack Electrophysiologist: Allred  CC: Pacemaker follow-up  [name] is a 82 y.o. female seen today for Dr Rayann Heman.  She presents today for routine electrophysiology followup.  Since last being seen in our clinic, the patient reports doing reasoanbly well.  She has had some tachy palpitations and persistent LE edema for which she wears compression hose.  She denies chest pain, dyspnea, PND, orthopnea, nausea, vomiting, dizziness, syncope, weight gain, or early satiety.  Device History: STJ dual chamber PPM implanted 2009 for tachy/brady    Past Medical History  Diagnosis Date  . Hyperlipidemia   . Allergy   . Sick sinus syndrome (HCC)     s/p PPM  . Diverticulosis of colon with hemorrhage   . Diastolic dysfunction   . Unspecified vitamin D deficiency   . Esophageal reflux   . Mixed hyperlipidemia   . Iron deficiency anemia secondary to blood loss (chronic)   . Osteoarthritis   . Skin cancer of nose     Removed by derm  . Hypothyroidism   . CKD (chronic kidney disease) stage 2, GFR 60-89 ml/min   . Diabetes mellitus with renal manifestation ([place])   . Hypertension   . History of GI diverticular bleed 11/2011    ASA stopped  . GERD (gastroesophageal reflux disease)     s/p Nissen Repair, Dr. Hassell Done  . Fracture of T11 vertebra ([place]) 2010    Compression fracture  . Afib ([place])     No Coumadin secondary to history of GI bleed.Marland Kitchen ECHO 12/04/11 LVEF estimated by 2D at 60-65%.   Past Surgical History  Procedure Laterality Date  . Pacemaker insertion  03/29/08    For SSS. SJM Zephyr XL DR Implanted by Dr. Leonia Reeves.  . Nissen fundoplication    . Laparoscopic reduction of intrathoracic stomach and repair of hiatal hernia    . Kyphosis surgery  2010 & 2012    x3  . Hip arthroplasty Left 08/22/2013    Procedure: ARTHROPLASTY  BIPOLAR HIP;  Surgeon: Mauri Pole, MD;  Location: [facility];  Service: Orthopedics;  Laterality: Left;  . Hiatal hernia repair    . Cystocele repair      x 2  . Tonsillectomy    . Joint replacement      left hip  . Eye surgery      bilateral cataracts with lens implants  . Vaginal hysterectomy      partial  . Esophagogastroduodenoscopy (egd) with propofol N/A 06/29/2014    Procedure: ESOPHAGOGASTRODUODENOSCOPY (EGD) WITH PROPOFOL;  Surgeon: Garlan Fair, MD;  Location[facility];  Service: Endoscopy;  Laterality: N/A;  . Balloon dilation N/A 06/29/2014    Procedure: BALLOON DILATION;  Surgeon: Garlan Fair, MD;  Location: [facility];  Service: Endoscopy;  Laterality: N/A;    Current Outpatient Prescriptions  Medication Sig Dispense Refill  . amiodarone (PACERONE) 200 MG tablet Take 1 tablet (200 mg total) by mouth daily. 90 tablet 1  . Calcium Carbonate-Vitamin D (CALCIUM 600 + D PO) Take 1 tablet by mouth daily.    . cetirizine (ZYRTEC) 10 MG tablet Take 10 mg by mouth at bedtime.    . cholecalciferol (VITAMIN D) 1000 UNITS tablet Take 1,000 Units by mouth daily.    Marland Kitchen diltiazem (CARDIZEM CD) 120 MG 24 hr capsule Take 1 capsule (120 mg total) by mouth daily. [place]  capsule 2  . furosemide (LASIX) 40 MG tablet Take 40 mg by mouth daily.     Marland Kitchen gabapentin (NEURONTIN) 300 MG capsule Take 2 capsules by mouth 2 (two) times daily.  1  . HYDROcodone-acetaminophen (NORCO) 10-325 MG per tablet Take 1 tablet by mouth every 8 (eight) hours as needed. Back pain  0  . levothyroxine (SYNTHROID, LEVOTHROID) 50 MCG tablet Take 50 mcg by mouth daily before breakfast.    . losartan (COZAAR) 100 MG tablet Take 100 mg by mouth every morning.    . metoprolol (TOPROL-XL) 200 MG 24 hr tablet Take 200 mg by mouth every morning.     . Multiple Vitamin (MULTIVITAMIN WITH MINERALS) TABS tablet Take 1 tablet by mouth daily.    . Omega-3 Fatty Acids (FISH OIL) 1000 MG CAPS Take 100 mg by mouth daily.    .  pravastatin (PRAVACHOL) 40 MG tablet Take 40 mg by mouth daily.     No current facility-administered medications for this visit.    Allergies:   Ace inhibitors; Aspirin; Sulfa drugs cross reactors; Atorvastatin; and Welchol   Social History: Social History   Social History  . Marital Status: Widowed    Spouse Name: N/A  . Number of Children: N/A  . Years of Education: N/A   Occupational History  . Not on file.   Social History Main Topics  . Smoking status: Former Smoker    Types: Cigarettes    Quit date: 12/09/1978  . Smokeless tobacco: Never Used  . Alcohol Use: No  . Drug Use: No  . Sexual Activity: No   Other Topics Concern  . Not on file   Social History Narrative    Family History: Family History  Problem Relation Age of Onset  . Colon cancer Mother   . CAD Father   . CAD Sister      Review of Systems: All other systems reviewed and are otherwise negative except as noted above.   Physical Exam: VS:  BP 150/74 mmHg  Pulse 69  Ht 5\' 3"  (1.6 m)  Wt 132 lb 9.6 oz (60.147 kg)  BMI 23.49 kg/m2 , BMI Body mass index is 23.49 kg/(m^2).  GEN- The patient is elderly appearing, alert and oriented x 3 today   HEENT: normocephalic, atraumatic; sclera clear, conjunctiva pink; hearing intact; oropharynx clear; neck supple  Lungs- Clear to ausculation bilaterally, normal work of breathing.  No wheezes, rales, rhonchi Heart- Regular rate and rhythm  GI- soft, non-tender, non-distended, bowel sounds present  Extremities- no clubbing, cyanosis, 1+ BLE edema; DP/PT/radial pulses 1+ bilaterally MS- no significant deformity or atrophy Skin- warm and dry, no rash or lesion; PPM pocket well healed Psych- euthymic mood, full affect Neuro- strength and sensation are intact  PPM Interrogation- reviewed in detail today,  See PACEART report  EKG:  EKG is ordered today. EKG today shows atrial pacing with intrinsic ventricular conduction, RBBB  Recent Labs: No results  found for requested labs within last 365 days.   Wt Readings from Last 3 Encounters:  01/25/16 132 lb 9.6 oz (60.147 kg)  04/07/15 134 lb 6.4 oz (60.963 kg)  12/20/14 132 lb 3.2 oz (59.966 kg)     Other studies Reviewed: Additional studies/ records that were reviewed today include: Dr Rayann Heman and Dr Hebert Soho office notes   Assessment and Plan:  1.  Sick sinus syndrome Normal PPM function See Pace Art report No changes today  2.  Persistent atrial fibrillation Burden by device interrogation today <  1%, but increased recently Increase amiodarone to 200mg  daily today (TSH, LFT's today) Not felt to be a candidate for [place]   3.  HTN Stable No change required today   Current medicines are reviewed at length with the patient today.   The patient does not have concerns regarding her medicines.  The following changes were made today:  none  Labs/ tests ordered today include: TSH, LFT's   Disposition:   Follow up with Dr Irish Lack as scheduled, device clinic 6 months, me in 1 year      Signed, Chanetta Marshall, NP 01/25/2016 12:41 PM  [facility] [address] [place] [address] [phone] (office) [phone] (fax)

## 2016-01-26 ENCOUNTER — Telehealth: Payer: Self-pay | Admitting: *Deleted

## 2016-01-26 LAB — TSH: TSH: 3.35 m[IU]/L

## 2016-01-26 NOTE — Telephone Encounter (Signed)
-----   Message from Patsey Berthold, NP sent at 01/26/2016  7:22 AM EDT ----- Please notify patient of stable labs and send copy to PCP.  Thank you!

## 2016-04-26 ENCOUNTER — Encounter: Payer: Self-pay | Admitting: Internal Medicine

## 2016-04-26 DIAGNOSIS — I495 Sick sinus syndrome: Secondary | ICD-10-CM | POA: Diagnosis not present

## 2016-05-16 ENCOUNTER — Other Ambulatory Visit: Payer: Self-pay | Admitting: *Deleted

## 2016-05-16 DIAGNOSIS — I480 Paroxysmal atrial fibrillation: Secondary | ICD-10-CM

## 2016-05-16 DIAGNOSIS — I495 Sick sinus syndrome: Secondary | ICD-10-CM

## 2016-05-16 MED ORDER — DILTIAZEM HCL ER COATED BEADS 120 MG PO CP24: 120.0000 mg | ORAL_CAPSULE | Freq: Every day | ORAL | Status: DC

## 2016-07-02 ENCOUNTER — Other Ambulatory Visit: Payer: Self-pay | Admitting: Interventional Cardiology

## 2016-07-02 MED ORDER — AMIODARONE HCL 200 MG PO TABS: 200.0000 mg | ORAL_TABLET | Freq: Every day | ORAL | 1 refills | Status: DC

## 2016-07-12 ENCOUNTER — Telehealth: Payer: Self-pay | Admitting: Interventional Cardiology

## 2016-07-12 NOTE — Telephone Encounter (Signed)
[facility] mail service pharmacy call to verify pt's Amiodarone  Prescription. Pt was seen on 01/25/16 in the EP clinic by NP. Pacemaker was checked pt has been in persistent A-fibrillation. Amiodarone dose was increased 200 mg 1/2 tablet once daily to 200 mg once daily. [facility] Pharmacist aware.

## 2016-07-12 NOTE — Telephone Encounter (Signed)
New message    Pharmacist Ann, verbalized that she needs the medication clarification for Amiodarone 200mg  1x day because a new prescription was written for more than the regular dose. Pt is taking a half a day since 08/2015   *STAT* If patient is at the pharmacy, call can be transferred to refill team.   1. Which medications need to be refilled? (please list name of each medication and dose if known) Amiodarone 200mg  1x day   2. Which pharmacy/location (including street and city if local pharmacy) is medication to be sent to?  [place], [place]  3. Do they need a 30 day or 90 day supply? unknown

## 2016-07-24 ENCOUNTER — Telehealth: Payer: Self-pay | Admitting: Internal Medicine

## 2016-07-24 NOTE — Telephone Encounter (Signed)
Informed pharmacist Webb Silversmith at [facility], that patient's amiodarone was increase at her last office visit from 100 mg to 200 mg daily, due to patient's persistent atrial fibrillation. Patient complaining to [facility] that she is suppose to be taking 100 mg, and that she feels dizzy. Patient has not reported to our office of any symptoms. Will call patient.  Called patient about her medication. Patient states she does not recall anyone increasing her Amiodarone to 200 mg daily. Patient complained that the last time she took a whole pill (2 years ago) she became dizzy and was stumbling all the time. Patient stated she does not remember being in our office in March, when medication was increased. Patient stated she did not know she had an appointment with device clinic either on 07/30/16. Reminded patient of her appointment date and time. Patient verbalized understanding. Will forward to Dr. Rayann Heman and his nurse for further advisement.

## 2016-07-24 NOTE — Telephone Encounter (Signed)
New message        Calling to clarify directions on amiodarone 200mg  daily.  Please use ref number [id number].  Pt was expecting a pres for 100mg .

## 2016-07-30 ENCOUNTER — Encounter: Payer: Self-pay | Admitting: Internal Medicine

## 2016-07-30 ENCOUNTER — Ambulatory Visit (INDEPENDENT_AMBULATORY_CARE_PROVIDER_SITE_OTHER): Payer: Medicare Other | Admitting: *Deleted

## 2016-07-30 DIAGNOSIS — Z950 Presence of cardiac pacemaker: Secondary | ICD-10-CM

## 2016-07-30 DIAGNOSIS — I480 Paroxysmal atrial fibrillation: Secondary | ICD-10-CM | POA: Diagnosis not present

## 2016-07-30 LAB — CUP PACEART INCLINIC DEVICE CHECK
Battery Impedance: 2400 Ohm
Battery Voltage: 2.76 V
Brady Statistic RA Percent Paced: 99 %
Brady Statistic RV Percent Paced: 36 %
Date Time Interrogation Session: 20170918161424
Implantable Lead Implant Date: 20090518
Implantable Lead Implant Date: 20090518
Implantable Lead Location: 753859
Implantable Lead Location: 753860
Lead Channel Impedance Value: 355 Ohm
Lead Channel Impedance Value: 626 Ohm
Lead Channel Pacing Threshold Amplitude: 0.5 V
Lead Channel Pacing Threshold Amplitude: 1 V
Lead Channel Pacing Threshold Pulse Width: 0.4 ms
Lead Channel Pacing Threshold Pulse Width: 0.4 ms
Lead Channel Sensing Intrinsic Amplitude: 1 mV
Lead Channel Sensing Intrinsic Amplitude: 12 mV
Lead Channel Setting Pacing Amplitude: 2 V
Lead Channel Setting Pacing Amplitude: 2.5 V
Lead Channel Setting Pacing Pulse Width: 0.4 ms
Lead Channel Setting Sensing Sensitivity: 2.5 mV
Pulse Gen Model: 5826
Pulse Gen Serial Number: 2117330

## 2016-07-30 MED ORDER — AMIODARONE HCL 200 MG PO TABS: 100.0000 mg | ORAL_TABLET | Freq: Every day | ORAL | 1 refills | Status: DC

## 2016-07-30 NOTE — Patient Instructions (Addendum)
Your physician wants you to follow-up in: 6 months with Dr. Allred. You will receive a reminder letter in the mail two months in advance. If you don't receive a letter, please call our office to schedule the follow-up appointment.  

## 2016-07-30 NOTE — Progress Notes (Signed)
Pacemaker check in clinic. Normal device function. Thresholds, sensing, impedances consistent with previous measurements. Device programmed to maximize longevity. 935 mode switches (<1%), no OAC due to hx of GI bleed, +amiodarone (dose clarified with JA and patient, see med list). No episode triggers enabled. Device programmed at appropriate safety margins. Histogram distribution appropriate for patient activity level. Device programmed to optimize intrinsic conduction. Estimated longevity 3.0-3.75 years. Patient education completed. ROV with JA in 6 months.

## 2016-07-31 NOTE — Telephone Encounter (Signed)
Device clinic note and interrogation reviewed and uneventful. She may benefit from follow-up with EP PA-C if her symptoms persist. Claiborne Billings, please check on patient.

## 2016-08-09 NOTE — Telephone Encounter (Addendum)
I called and spoke with her son and see how she is feeling.  She was out shopping and will call back if needed

## 2016-11-13 ENCOUNTER — Emergency Department (HOSPITAL_COMMUNITY): Payer: Medicare Other

## 2016-11-13 ENCOUNTER — Encounter (HOSPITAL_COMMUNITY): Payer: Self-pay | Admitting: *Deleted

## 2016-11-13 ENCOUNTER — Inpatient Hospital Stay (HOSPITAL_COMMUNITY)
Admission: EM | Admit: 2016-11-13 | Discharge: 2016-11-17 | DRG: 292 | Disposition: A | Discharge status: Skilled Nursing Facility | Payer: Medicare Other | Attending: Internal Medicine | Admitting: Internal Medicine

## 2016-11-13 DIAGNOSIS — R0602 Shortness of breath: Secondary | ICD-10-CM

## 2016-11-13 DIAGNOSIS — Z7189 Other specified counseling: Secondary | ICD-10-CM

## 2016-11-13 DIAGNOSIS — R110 Nausea: Secondary | ICD-10-CM

## 2016-11-13 DIAGNOSIS — Z888 Allergy status to other drugs, medicaments and biological substances status: Secondary | ICD-10-CM

## 2016-11-13 DIAGNOSIS — Z882 Allergy status to sulfonamides status: Secondary | ICD-10-CM

## 2016-11-13 DIAGNOSIS — M545 Low back pain, unspecified: Secondary | ICD-10-CM

## 2016-11-13 DIAGNOSIS — Z96642 Presence of left artificial hip joint: Secondary | ICD-10-CM | POA: Diagnosis present

## 2016-11-13 DIAGNOSIS — Z950 Presence of cardiac pacemaker: Secondary | ICD-10-CM | POA: Diagnosis present

## 2016-11-13 DIAGNOSIS — Z9181 History of falling: Secondary | ICD-10-CM

## 2016-11-13 DIAGNOSIS — K219 Gastro-esophageal reflux disease without esophagitis: Secondary | ICD-10-CM | POA: Diagnosis present

## 2016-11-13 DIAGNOSIS — Z23 Encounter for immunization: Secondary | ICD-10-CM

## 2016-11-13 DIAGNOSIS — Z66 Do not resuscitate: Secondary | ICD-10-CM | POA: Diagnosis not present

## 2016-11-13 DIAGNOSIS — I110 Hypertensive heart disease with heart failure: Principal | ICD-10-CM | POA: Diagnosis present

## 2016-11-13 DIAGNOSIS — Z9119 Patient's noncompliance with other medical treatment and regimen: Secondary | ICD-10-CM

## 2016-11-13 DIAGNOSIS — Z87311 Personal history of (healed) other pathological fracture: Secondary | ICD-10-CM

## 2016-11-13 DIAGNOSIS — I482 Chronic atrial fibrillation, unspecified: Secondary | ICD-10-CM | POA: Diagnosis present

## 2016-11-13 DIAGNOSIS — E039 Hypothyroidism, unspecified: Secondary | ICD-10-CM | POA: Diagnosis present

## 2016-11-13 DIAGNOSIS — I5033 Acute on chronic diastolic (congestive) heart failure: Secondary | ICD-10-CM | POA: Diagnosis present

## 2016-11-13 DIAGNOSIS — T501X6A Underdosing of loop [high-ceiling] diuretics, initial encounter: Secondary | ICD-10-CM | POA: Diagnosis present

## 2016-11-13 DIAGNOSIS — R600 Localized edema: Secondary | ICD-10-CM

## 2016-11-13 DIAGNOSIS — K5900 Constipation, unspecified: Secondary | ICD-10-CM | POA: Diagnosis present

## 2016-11-13 DIAGNOSIS — M549 Dorsalgia, unspecified: Secondary | ICD-10-CM | POA: Diagnosis present

## 2016-11-13 DIAGNOSIS — I509 Heart failure, unspecified: Secondary | ICD-10-CM

## 2016-11-13 DIAGNOSIS — E782 Mixed hyperlipidemia: Secondary | ICD-10-CM | POA: Diagnosis present

## 2016-11-13 DIAGNOSIS — I480 Paroxysmal atrial fibrillation: Secondary | ICD-10-CM | POA: Diagnosis present

## 2016-11-13 DIAGNOSIS — E871 Hypo-osmolality and hyponatremia: Secondary | ICD-10-CM | POA: Diagnosis present

## 2016-11-13 DIAGNOSIS — Z886 Allergy status to analgesic agent status: Secondary | ICD-10-CM

## 2016-11-13 DIAGNOSIS — Z8249 Family history of ischemic heart disease and other diseases of the circulatory system: Secondary | ICD-10-CM

## 2016-11-13 DIAGNOSIS — Z87891 Personal history of nicotine dependence: Secondary | ICD-10-CM

## 2016-11-13 DIAGNOSIS — M479 Spondylosis, unspecified: Secondary | ICD-10-CM | POA: Diagnosis present

## 2016-11-13 DIAGNOSIS — I10 Essential (primary) hypertension: Secondary | ICD-10-CM | POA: Diagnosis present

## 2016-11-13 DIAGNOSIS — Z91128 Patient's intentional underdosing of medication regimen for other reason: Secondary | ICD-10-CM

## 2016-11-13 DIAGNOSIS — I495 Sick sinus syndrome: Secondary | ICD-10-CM | POA: Diagnosis present

## 2016-11-13 LAB — URINALYSIS, ROUTINE W REFLEX MICROSCOPIC
BACTERIA UA: NONE SEEN
BILIRUBIN URINE: NEGATIVE
Glucose, UA: NEGATIVE mg/dL
Ketones, ur: 20 mg/dL — AB
Leukocytes, UA: NEGATIVE
NITRITE: NEGATIVE
PROTEIN: 30 mg/dL — AB
Specific Gravity, Urine: 1.012 (ref 1.005–1.030)
pH: 7 (ref 5.0–8.0)

## 2016-11-13 LAB — CBC
HCT: 32.7 % — ABNORMAL LOW (ref 36.0–46.0)
Hemoglobin: 10.7 g/dL — ABNORMAL LOW (ref 12.0–15.0)
MCH: 29.8 pg (ref 26.0–34.0)
MCHC: 32.7 g/dL (ref 30.0–36.0)
MCV: 91.1 fL (ref 78.0–100.0)
Platelets: 263 K/uL (ref 150–400)
RBC: 3.59 MIL/uL — ABNORMAL LOW (ref 3.87–5.11)
RDW: 14.6 % (ref 11.5–15.5)
WBC: 6.9 K/uL (ref 4.0–10.5)

## 2016-11-13 LAB — BASIC METABOLIC PANEL
ANION GAP: 8 (ref 5–15)
BUN: 8 mg/dL (ref 6–20)
CHLORIDE: 99 mmol/L — AB (ref 101–111)
CO2: 24 mmol/L (ref 22–32)
CREATININE: 0.62 mg/dL (ref 0.44–1.00)
Calcium: 9.2 mg/dL (ref 8.9–10.3)
GFR calc non Af Amer: >60 mL/min (ref >60)
GLUCOSE: 117 mg/dL — AB (ref 65–99)
Potassium: 3.6 mmol/L (ref 3.5–5.1)
Sodium: 131 mmol/L — ABNORMAL LOW (ref 135–145)

## 2016-11-13 LAB — TROPONIN I: Troponin I: <0.03 ng/mL (ref <0.03)

## 2016-11-13 LAB — BRAIN NATRIURETIC PEPTIDE: B NATRIURETIC PEPTIDE 5: 412.3 pg/mL — AB (ref 0.0–100.0)

## 2016-11-13 MED ORDER — PROCHLORPERAZINE MALEATE 10 MG PO TABS
10.0000 mg | ORAL_TABLET | Freq: Once | ORAL | Status: AC
  Administered 2016-11-13: 10 mg via ORAL
  Filled 2016-11-13: qty 1

## 2016-11-13 MED ORDER — FUROSEMIDE 10 MG/ML IJ SOLN
40.0000 mg | Freq: Once | INTRAMUSCULAR | Status: AC
  Administered 2016-11-13: 40 mg via INTRAVENOUS
  Filled 2016-11-13: qty 4

## 2016-11-13 MED ORDER — HYDROCODONE-ACETAMINOPHEN 5-325 MG PO TABS
2.0000 | ORAL_TABLET | Freq: Once | ORAL | Status: AC
  Administered 2016-11-13: 2 via ORAL
  Filled 2016-11-13: qty 2

## 2016-11-13 MED ORDER — CYCLOBENZAPRINE HCL 10 MG PO TABS: 10.0000 mg | ORAL_TABLET | Freq: Three times a day (TID) | ORAL | 0 refills | Status: DC | PRN

## 2016-11-13 MED ORDER — ONDANSETRON HCL 4 MG/2ML IJ SOLN
4.0000 mg | Freq: Once | INTRAMUSCULAR | Status: AC
  Administered 2016-11-13: 4 mg via INTRAVENOUS
  Filled 2016-11-13: qty 2

## 2016-11-13 MED ORDER — MORPHINE SULFATE (PF) 4 MG/ML IV SOLN
4.0000 mg | Freq: Once | INTRAVENOUS | Status: AC
Start: 2016-11-13 — End: 2016-11-13
  Administered 2016-11-13: 4 mg via INTRAVENOUS
  Filled 2016-11-13: qty 1

## 2016-11-13 NOTE — ED Notes (Signed)
Pt ambulatory to restroom with walker and 1 assist.

## 2016-11-13 NOTE — ED Notes (Signed)
ED Provider at bedside. 

## 2016-11-13 NOTE — ED Notes (Signed)
Pt refuses to ambulate in hall. Family member & pt would like to speak to case management to figure out a plan of care in the pt's home while family is not around to look after her.

## 2016-11-13 NOTE — Discharge Instructions (Signed)
Elevate your legs  Where your compression stockings Please minimize her salt intake Please avoid processed foods  Please use your hydrocodone every 4-6 hours as needed for pain

## 2016-11-13 NOTE — ED Notes (Signed)
Bed: [facility] Expected date:  Expected time:  Means of arrival:  Comments: 

## 2016-11-13 NOTE — ED Notes (Signed)
Pt's oxygen dropping into the 80's.

## 2016-11-13 NOTE — ED Triage Notes (Signed)
Per EMS, pt from home, pt has hx of chronic back pain.  Had fallen x 2 weeks ago which made the pain even worse.  Pt is A&O x 4.  Pt is HOH.

## 2016-11-13 NOTE — ED Notes (Signed)
Family requesting to see EDP.  Dr. Venora Maples informed.

## 2016-11-14 ENCOUNTER — Encounter (HOSPITAL_COMMUNITY): Payer: Self-pay | Admitting: Family Medicine

## 2016-11-14 ENCOUNTER — Inpatient Hospital Stay (HOSPITAL_COMMUNITY): Payer: Medicare Other

## 2016-11-14 DIAGNOSIS — M479 Spondylosis, unspecified: Secondary | ICD-10-CM | POA: Diagnosis present

## 2016-11-14 DIAGNOSIS — R110 Nausea: Secondary | ICD-10-CM | POA: Diagnosis present

## 2016-11-14 DIAGNOSIS — I5033 Acute on chronic diastolic (congestive) heart failure: Secondary | ICD-10-CM | POA: Diagnosis present

## 2016-11-14 DIAGNOSIS — I495 Sick sinus syndrome: Secondary | ICD-10-CM

## 2016-11-14 DIAGNOSIS — E782 Mixed hyperlipidemia: Secondary | ICD-10-CM | POA: Diagnosis present

## 2016-11-14 DIAGNOSIS — Z9119 Patient's noncompliance with other medical treatment and regimen: Secondary | ICD-10-CM | POA: Diagnosis not present

## 2016-11-14 DIAGNOSIS — Z8249 Family history of ischemic heart disease and other diseases of the circulatory system: Secondary | ICD-10-CM | POA: Diagnosis not present

## 2016-11-14 DIAGNOSIS — Z87311 Personal history of (healed) other pathological fracture: Secondary | ICD-10-CM | POA: Diagnosis not present

## 2016-11-14 DIAGNOSIS — E871 Hypo-osmolality and hyponatremia: Secondary | ICD-10-CM | POA: Diagnosis not present

## 2016-11-14 DIAGNOSIS — K5900 Constipation, unspecified: Secondary | ICD-10-CM | POA: Diagnosis present

## 2016-11-14 DIAGNOSIS — Z91128 Patient's intentional underdosing of medication regimen for other reason: Secondary | ICD-10-CM | POA: Diagnosis not present

## 2016-11-14 DIAGNOSIS — Z886 Allergy status to analgesic agent status: Secondary | ICD-10-CM | POA: Diagnosis not present

## 2016-11-14 DIAGNOSIS — K219 Gastro-esophageal reflux disease without esophagitis: Secondary | ICD-10-CM | POA: Diagnosis present

## 2016-11-14 DIAGNOSIS — I110 Hypertensive heart disease with heart failure: Secondary | ICD-10-CM | POA: Diagnosis present

## 2016-11-14 DIAGNOSIS — Z66 Do not resuscitate: Secondary | ICD-10-CM | POA: Diagnosis not present

## 2016-11-14 DIAGNOSIS — I10 Essential (primary) hypertension: Secondary | ICD-10-CM | POA: Diagnosis not present

## 2016-11-14 DIAGNOSIS — Z87891 Personal history of nicotine dependence: Secondary | ICD-10-CM | POA: Diagnosis not present

## 2016-11-14 DIAGNOSIS — Z9181 History of falling: Secondary | ICD-10-CM | POA: Diagnosis not present

## 2016-11-14 DIAGNOSIS — Z950 Presence of cardiac pacemaker: Secondary | ICD-10-CM

## 2016-11-14 DIAGNOSIS — I509 Heart failure, unspecified: Secondary | ICD-10-CM | POA: Diagnosis not present

## 2016-11-14 DIAGNOSIS — Z7189 Other specified counseling: Secondary | ICD-10-CM | POA: Diagnosis not present

## 2016-11-14 DIAGNOSIS — M545 Low back pain: Secondary | ICD-10-CM | POA: Diagnosis not present

## 2016-11-14 DIAGNOSIS — Z23 Encounter for immunization: Secondary | ICD-10-CM | POA: Diagnosis present

## 2016-11-14 DIAGNOSIS — I482 Chronic atrial fibrillation: Secondary | ICD-10-CM | POA: Diagnosis not present

## 2016-11-14 DIAGNOSIS — E039 Hypothyroidism, unspecified: Secondary | ICD-10-CM | POA: Diagnosis present

## 2016-11-14 DIAGNOSIS — I480 Paroxysmal atrial fibrillation: Secondary | ICD-10-CM | POA: Diagnosis present

## 2016-11-14 DIAGNOSIS — Z888 Allergy status to other drugs, medicaments and biological substances status: Secondary | ICD-10-CM | POA: Diagnosis not present

## 2016-11-14 DIAGNOSIS — T501X6A Underdosing of loop [high-ceiling] diuretics, initial encounter: Secondary | ICD-10-CM | POA: Diagnosis present

## 2016-11-14 DIAGNOSIS — Z882 Allergy status to sulfonamides status: Secondary | ICD-10-CM | POA: Diagnosis not present

## 2016-11-14 DIAGNOSIS — M549 Dorsalgia, unspecified: Secondary | ICD-10-CM | POA: Diagnosis present

## 2016-11-14 DIAGNOSIS — Z96642 Presence of left artificial hip joint: Secondary | ICD-10-CM | POA: Diagnosis present

## 2016-11-14 LAB — OSMOLALITY

## 2016-11-14 LAB — ECHOCARDIOGRAM COMPLETE
Height: 60 in
Weight: 2144.63 oz

## 2016-11-14 LAB — SODIUM, URINE, RANDOM: Sodium, Ur: 109 mmol/L

## 2016-11-14 LAB — TSH: TSH: 3.006 u[IU]/mL (ref 0.350–4.500)

## 2016-11-14 MED ORDER — GABAPENTIN 300 MG PO CAPS
600.0000 mg | ORAL_CAPSULE | Freq: Every day | ORAL | Status: DC
  Administered 2016-11-14 – 2016-11-16 (×4): 600 mg via ORAL
  Filled 2016-11-14 (×4): qty 2

## 2016-11-14 MED ORDER — POLYETHYLENE GLYCOL 3350 17 G PO PACK
17.0000 g | PACK | Freq: Every day | ORAL | Status: DC
  Administered 2016-11-14 – 2016-11-17 (×4): 17 g via ORAL
  Filled 2016-11-14 (×4): qty 1

## 2016-11-14 MED ORDER — AMIODARONE HCL 100 MG PO TABS
100.0000 mg | ORAL_TABLET | Freq: Every day | ORAL | Status: DC
  Administered 2016-11-14 – 2016-11-17 (×4): 100 mg via ORAL
  Filled 2016-11-14 (×3): qty 1

## 2016-11-14 MED ORDER — HYDROCODONE-ACETAMINOPHEN 10-325 MG PO TABS
1.0000 | ORAL_TABLET | Freq: Three times a day (TID) | ORAL | Status: DC | PRN
  Administered 2016-11-14 – 2016-11-17 (×3): 1 via ORAL
  Filled 2016-11-14 (×4): qty 1

## 2016-11-14 MED ORDER — DILTIAZEM HCL ER COATED BEADS 120 MG PO CP24
120.0000 mg | ORAL_CAPSULE | Freq: Every day | ORAL | Status: DC
  Administered 2016-11-14 – 2016-11-17 (×4): 120 mg via ORAL
  Filled 2016-11-14 (×4): qty 1

## 2016-11-14 MED ORDER — LOSARTAN POTASSIUM 50 MG PO TABS
100.0000 mg | ORAL_TABLET | Freq: Every morning | ORAL | Status: DC
  Administered 2016-11-14 – 2016-11-17 (×4): 100 mg via ORAL
  Filled 2016-11-14 (×4): qty 2

## 2016-11-14 MED ORDER — LEVOTHYROXINE SODIUM 50 MCG PO TABS
50.0000 ug | ORAL_TABLET | Freq: Every day | ORAL | Status: DC
  Administered 2016-11-14 – 2016-11-17 (×4): 50 ug via ORAL
  Filled 2016-11-14 (×4): qty 1

## 2016-11-14 MED ORDER — POTASSIUM CHLORIDE CRYS ER 20 MEQ PO TBCR
40.0000 meq | EXTENDED_RELEASE_TABLET | Freq: Every day | ORAL | Status: DC
  Administered 2016-11-14 – 2016-11-17 (×4): 40 meq via ORAL
  Filled 2016-11-14 (×4): qty 2

## 2016-11-14 MED ORDER — ENOXAPARIN SODIUM 40 MG/0.4ML ~~LOC~~ SOLN
40.0000 mg | SUBCUTANEOUS | Status: DC
  Administered 2016-11-14 – 2016-11-17 (×4): 40 mg via SUBCUTANEOUS
  Filled 2016-11-14 (×4): qty 0.4

## 2016-11-14 MED ORDER — INFLUENZA VAC SPLIT QUAD 0.5 ML IM SUSY
0.5000 mL | PREFILLED_SYRINGE | INTRAMUSCULAR | Status: AC
  Administered 2016-11-15: 0.5 mL via INTRAMUSCULAR
  Filled 2016-11-14: qty 0.5

## 2016-11-14 MED ORDER — FUROSEMIDE 10 MG/ML IJ SOLN
40.0000 mg | Freq: Every day | INTRAMUSCULAR | Status: DC
  Administered 2016-11-14 – 2016-11-16 (×3): 40 mg via INTRAVENOUS
  Filled 2016-11-14 (×3): qty 4

## 2016-11-14 MED ORDER — METHOCARBAMOL 500 MG PO TABS
500.0000 mg | ORAL_TABLET | Freq: Three times a day (TID) | ORAL | Status: DC | PRN
  Filled 2016-11-14: qty 1

## 2016-11-14 MED ORDER — PRAVASTATIN SODIUM 40 MG PO TABS
40.0000 mg | ORAL_TABLET | Freq: Every day | ORAL | Status: DC
  Administered 2016-11-14 – 2016-11-16 (×3): 40 mg via ORAL
  Filled 2016-11-14 (×3): qty 1

## 2016-11-14 MED ORDER — DIPHENHYDRAMINE HCL 12.5 MG/5ML PO ELIX
12.5000 mg | ORAL_SOLUTION | Freq: Once | ORAL | Status: AC
  Administered 2016-11-14: 12.5 mg via ORAL
  Filled 2016-11-14: qty 5

## 2016-11-14 MED ORDER — METOPROLOL SUCCINATE ER 100 MG PO TB24
200.0000 mg | ORAL_TABLET | Freq: Every morning | ORAL | Status: DC
  Administered 2016-11-14 – 2016-11-17 (×4): 200 mg via ORAL
  Filled 2016-11-14 (×4): qty 2

## 2016-11-14 NOTE — Progress Notes (Signed)
PROGRESS NOTE    [name]  [id number] DOB: 1926-06-23 DOA: 11/13/2016 PCP: Mayra Neer, MD    Brief Narrative:  83 y.o. female with a past medical history significant for Afib not on warfarin 2/2 diverticular hemorrhage, HFpEF, sick sinus with pacer, HTN, and hypothyroidism who presents with leg swelling, dyspnea for 2 weeks and new low back pain.  With regard to dyspnea, the patient's daughter describes that she has been noticing a few weeks of increased leg swelling which she was initially trying to treat with TED hose. Then last week, daughter was unable to care for her mother for a few days due to flu, and afterwards she noticed her leg swelling was worse, and she now had shortness of breath with exertion (she gets out regularly to church only, and is otherwise limited to walking short distances only).  Furthermore, then the daughter discovered that her mother wasn't taking any of her medicines, possibly for weeks or more, so she brought her to the ER.  In the meantime, the patient was complaining over the last 2-3 days of increased back pain.  This is in the mid back, is worse with movement or being repositioned.  Is not associated with fever, leg weakness, bowel or blader incontinence  Patient was admitted for acute on chronic CHF.  Assessment & Plan:   Principal Problem:   Acute on chronic diastolic CHF (congestive heart failure) (HCC) Active Problems:   Chronic atrial fibrillation (HCC)   Hyponatremia   Hypothyroidism, acquired   Sick sinus syndrome ([place])   Pacemaker   Essential hypertension   Back pain  1. Acute on chronic diastolic CHF:  -Patient continued on Furosemide 40 mg IV daily -will continue with strict I/Os, daily weights -Continue ARB and BB -2d echo reviewed. Normal LVEF with indeterminant diastolic function secondary to afib -TSH reviewed. Within normal limits  2. Back pain:  -Unclear etiology.  No new compression fractures.  CT imaging is  unrevealing.  -PT eval requested -Given trial of Robaxin cautiously  3. Chronic atrial fibrillation:  CHADS2Vasc of at least5.  Holding coumadin secondary to history of diverticular bleeding. -Continue amiodarone -Continue diltiazem and BB - Stable at this time  4. Hyponatremia:  Mild.  Hypervolemic.   -Check urine sodium, urine and serum Osms -Repeat bmet in AM  5. HTN:  -Continue ARB, BB, statin -BP stable at present  6. Hypothyroidism:  -Continue levothyroxine - normal TSH  7. Other medications:  -Continue gabapentin continued  DVT prophylaxis: Lovenox subQ Code Status: Full Family Communication: Pt in room, family not at bedside Disposition Plan: Uncertain at this time  Consultants:     Procedures:     Antimicrobials: Anti-infectives    None       Subjective: No complaints  Objective: Vitals:   11/14/16 0150 11/14/16 0609 11/14/16 1044 11/14/16 1448  BP: (!) 156/75 (!) 151/68 (!) 144/69 (!) 148/76  Pulse: 78 88 69 95  Resp: 18 18  20   Temp: 97.5 F (36.4 C) 98 F (36.7 C)  97.5 F (36.4 C)  TempSrc: Oral Oral  Oral  SpO2: 96% 96%  96%  Weight:  60.8 kg (134 lb 0.6 oz)    Height: 5' (1.524 m)       Intake/Output Summary (Last 24 hours) at 11/14/16 1918 Last data filed at 11/14/16 1300  Gross per 24 hour  Intake              480 ml  Output  1350 ml  Net             -870 ml   Filed Weights   11/14/16 0609  Weight: 60.8 kg (134 lb 0.6 oz)    Examination:  General exam: Appears calm and comfortable  Respiratory system: Clear to auscultation. Respiratory effort normal. Cardiovascular system: S1 & S2 heard, RRR.  Gastrointestinal system: Abdomen is nondistended, soft and nontender. No organomegaly or masses felt. Normal bowel sounds heard. Central nervous system: Alert and oriented. No focal neurological deficits. Extremities: Symmetric 5 x 5 power. Skin: No rashes, lesions  Psychiatry: Judgement and insight appear  normal. Mood & affect appropriate.   Data Reviewed: I have personally reviewed following labs and imaging studies  CBC:  Recent Labs Lab 11/13/16 1553  WBC 6.9  HGB 10.7*  HCT 32.7*  MCV 91.1  PLT 99991111   Basic Metabolic Panel:  Recent Labs Lab 11/13/16 1553  NA 131*  K 3.6  CL 99*  CO2 24  GLUCOSE 117*  BUN 8  CREATININE 0.62  CALCIUM 9.2   GFR: Estimated Creatinine Clearance: 39.6 mL/min (by C-G formula based on SCr of 0.62 mg/dL). Liver Function Tests: No results for input(s): AST, ALT, ALKPHOS, BILITOT, PROT, ALBUMIN in the last 168 hours. No results for input(s): LIPASE, AMYLASE in the last 168 hours. No results for input(s): AMMONIA in the last 168 hours. Coagulation Profile: No results for input(s): INR, PROTIME in the last 168 hours. Cardiac Enzymes:  Recent Labs Lab 11/13/16 2258  TROPONINI <0.03   BNP (last 3 results) No results for input(s): PROBNP in the last 8760 hours. HbA1C: No results for input(s): HGBA1C in the last 72 hours. CBG: No results for input(s): GLUCAP in the last 168 hours. Lipid Profile: No results for input(s): CHOL, HDL, LDLCALC, TRIG, CHOLHDL, LDLDIRECT in the last 72 hours. Thyroid Function Tests:  Recent Labs  11/14/16 0557  TSH 3.006   Anemia Panel: No results for input(s): VITAMINB12, FOLATE, FERRITIN, TIBC, IRON, RETICCTPCT in the last 72 hours. Sepsis Labs: No results for input(s): PROCALCITON, LATICACIDVEN in the last 168 hours.  No results found for this or any previous visit (from the past 240 hour(s)).   Radiology Studies: Dg Chest 2 View  Result Date: 11/13/2016 CLINICAL DATA:  Fall 2 weeks ago, mid lower back pain for 2 days. Atrial fibrillation, diabetes, GERD, hypertension, former smoker. EXAM: CHEST  2 VIEW COMPARISON:  Chest x-rays dated 06/04/2014 and 08/21/2013. FINDINGS: There is mild cardiomegaly, stable. Atherosclerotic changes noted at the aortic arch. Left chest wall pacemaker in place with  grossly stable positioning of the leads. Subtle opacities within each lung, most likely mild edema. No confluent opacity to suggest developing pneumonia. Probable small left pleural effusion. No pneumothorax seen. There are healing fractures of the left posterior fifth through eighth ribs. Osteopenia limits characterization of the thoracic spine but no obvious evidence of acute fracture or dislocation is seen in the thoracic spine. Patient is status post vertebroplasties at the thoracolumbar junction. IMPRESSION: 1. Healing fractures of the posterior left fifth through eighth ribs, likely subacute in age. 2. Cardiomegaly with mild interstitial edema suggesting mild volume overload/CHF. No evidence of pneumonia. 3. Probable small left pleural effusion. 4. Aortic atherosclerosis. Electronically Signed   By: Franki Cabot M.D.   On: 11/13/2016 16:04   Dg Thoracic Spine 2 View  Result Date: 11/13/2016 CLINICAL DATA:  Mid lower back pain x 2 days. Per notes-pt fell 2 weeks ago A-fib, DM, GERD, HTN,  osteoarthritis, former smoker x 38 years ago Sx: kyphosis-2010/2012, pacemaker-03/29/2008 EXAM: THORACIC SPINE 2 VIEWS COMPARISON:  CT thoracic spine dated 04/18/2011. Chest x-ray dated 11/13/2016. FINDINGS: Status post vertebroplasties at the T11 through L1 levels, without significant change compared to recent chest x-rays. Osteopenia limits characterization of the majority of the thoracic spine but there is no acute fracture or displacement seen. Immediate paravertebral soft tissues are unremarkable. IMPRESSION: Osteopenia.  No acute findings. Electronically Signed   By: Franki Cabot M.D.   On: 11/13/2016 16:12   Dg Lumbar Spine Complete  Result Date: 11/13/2016 CLINICAL DATA:  Mid lower back pain x 2 days. Per notes-pt fell 2 weeks ago A-fib, DM, GERD, HTN, osteoarthritis, former smoker x 38 years ago Sx: kyphosis-2010/2012, pacemaker-03/29/2008 EXAM: LUMBAR SPINE - COMPLETE 4+ VIEW COMPARISON:  CT abdomen dated  06/30/2015 FINDINGS: There is stable scoliosis of the thoracolumbar spine. Again noted are changes of vertebroplasties at the thoracolumbar junction. There is a stable severe compression fracture deformity at the L4 vertebral body. There is an additional compression fracture of the L3 vertebral body, moderate in degree, also chronic in appearance, perhaps slightly worsened compared to the earlier CT. No acute appearing osseous abnormality identified. Atherosclerotic changes noted along the walls of the infrarenal abdominal aorta. Visualized paravertebral soft tissues are otherwise unremarkable. IMPRESSION: 1. No acute findings. Stable severe compression fracture deformity of the L4 vertebral body. Additional compression fracture deformity of the L3 vertebral body, moderate in degree, also chronic in appearance although perhaps slightly worsened in degree compared to the earlier CT of 06/30/2015. 2. No acute findings seen. 3. Aortic atherosclerosis. Electronically Signed   By: Franki Cabot M.D.   On: 11/13/2016 16:09   Ct Renal Stone Study  Result Date: 11/13/2016 CLINICAL DATA:  83 y/o F; history of chronic back pain status post fall 2 weeks ago with flank pain and nausea. EXAM: CT ABDOMEN AND PELVIS WITHOUT CONTRAST TECHNIQUE: Multidetector CT imaging of the abdomen and pelvis was performed following the standard protocol without IV contrast. COMPARISON:  None. FINDINGS: Lower chest: Small bilateral pleural effusions. Dependent ground-glass opacities in the lungs probably represents associated atelectasis. Moderate cardiomegaly. Extensive coronary artery calcification. Partially visualized pacemaking wires. Hepatobiliary: No focal liver lesion identified. Cholelithiasis. No evidence for cholecystitis. No intra or extrahepatic biliary ductal dilatation. Pancreas: Unremarkable. No pancreatic ductal dilatation or surrounding inflammatory changes. Spleen: Normal in size without focal abnormality. Adrenals/Urinary  Tract: Adrenal glands are unremarkable. Kidneys are normal, without renal calculi, focal lesion, or hydronephrosis. Bladder is unremarkable. Stomach/Bowel: No obstructive or inflammatory changes of the bowel. Normal appendix. Extensive sigmoid diverticulosis without evidence for diverticulitis. Surgical clips at the gastroesophageal junction prior hernia repair. Vascular/Lymphatic: Aortic atherosclerosis. No enlarged abdominal or pelvic lymph nodes. Reproductive: Pelvis is largely obscured by artifact from hip hardware. Status post hysterectomy. No discrete adnexal lesion identified. Other: Diastases rectus.  No significant herniation. Musculoskeletal: Stable compression deformities of the T11, T12, and L1 vertebral bodies post kyphoplasty, superior endplate deformity of the L3 vertebral body, and severe compression deformity of the L4 vertebral body. Multiple chronic rib fracture deformities are present bilaterally. Left hip hemiarthroplasty noted without apparent hardware related complication. No acute fracture is identified. IMPRESSION: 1. No acute process of the abdomen or pelvis identified as explanation for pain. 2. Small bilateral pleural effusions. 3. Moderate cardiomegaly and severe coronary artery calcification. 4. Cholelithiasis. 5. Extensive sigmoid diverticulosis without evidence for diverticulitis. 6. Aortic atherosclerosis with moderate to severe calcification. 7. Numerous chronic rib fractures. Several  stable compression deformities of the thoracic and lumbar spine post kyphoplasty from T11 through L1. No acute fracture identified. Electronically Signed   By: Kristine Garbe M.D.   On: 11/13/2016 22:11    Scheduled Meds: . amiodarone  100 mg Oral Daily  . diltiazem  120 mg Oral Daily  . enoxaparin (LOVENOX) injection  40 mg Subcutaneous Q24H  . furosemide  40 mg Intravenous Daily  . gabapentin  600 mg Oral QHS  . [START ON 11/15/2016] Influenza vac split quadrivalent PF  0.5 mL  Intramuscular Tomorrow-1000  . levothyroxine  50 mcg Oral QAC breakfast  . losartan  100 mg Oral q morning - 10a  . metoprolol  200 mg Oral q morning - 10a  . polyethylene glycol  17 g Oral Daily  . potassium chloride  40 mEq Oral Daily  . pravastatin  40 mg Oral Daily   Continuous Infusions:   LOS: 0 days   Bryanda Mikel, Orpah Melter, MD [facility] Pager [phone]  If 7PM-7AM, please contact night-coverage [email or web address] Password [email or web address] 11/14/2016, 7:18 PM

## 2016-11-14 NOTE — Progress Notes (Signed)
  Echocardiogram 2D Echocardiogram has been performed.  [name] 11/14/2016, 2:26 PM

## 2016-11-14 NOTE — ED Provider Notes (Signed)
[facility] DEPT Provider Note   CSN: [id number] Arrival date & time: 11/13/16  1232     History   Chief Complaint Chief Complaint  Patient presents with  . Back Pain    HPI [name] is a 83 y.o. female.  HPI Patient is brought to the emergency department for worsening low back pain over the past several days.  She did have a fall 2 weeks ago and has a history of lumbar compression fractures.  Family also reports noncompliance with her medicine and reports increasing lower extremity edema and increasing exertional shortness of breath.  Patient has a history of A. fib but is not on Coumadin secondary to history of GI bleed.  Noted to have oxygen sats in the mid 80s   Past Medical History:  Diagnosis Date  . Afib ([place])    No Coumadin secondary to history of GI bleed.Marland Kitchen ECHO 12/04/11 LVEF estimated by 2D at 60-65%.  . Allergy   . CKD (chronic kidney disease) stage 2, GFR 60-89 ml/min   . Diabetes mellitus with renal manifestation ([place])   . Diastolic dysfunction   . Diverticulosis of colon with hemorrhage   . Esophageal reflux   . Fracture of T11 vertebra ([place]) 2010   Compression fracture  . GERD (gastroesophageal reflux disease)    s/p Nissen Repair, Dr. Hassell Done  . History of GI diverticular bleed 11/2011   ASA stopped  . Hyperlipidemia   . Hypertension   . Hypothyroidism   . Iron deficiency anemia secondary to blood loss (chronic)   . Mixed hyperlipidemia   . Osteoarthritis   . Sick sinus syndrome ([facility])    s/p PPM  . Skin cancer of nose    Removed by derm  . Unspecified vitamin D deficiency     Patient Active Problem List   Diagnosis Date Noted  . Essential hypertension 04/07/2015  . Edema 12/20/2014  . Mixed hyperlipidemia 04/06/2014  . Sick sinus syndrome ([place]) 12/14/2013  . Pacemaker 12/14/2013  . Unspecified constipation 08/27/2013  . Hypokalemia 08/24/2013  . Hypomagnesemia 08/24/2013  . Diastolic CHF ([place]) [id number]  . Unspecified hypothyroidism  08/23/2013  . Hyponatremia 08/21/2013  . Left displaced femoral neck fracture ([place]) 08/21/2013  . History of sick sinus syndrome 08/21/2013  . Breast cyst 08/21/2013  . HTN (hypertension) 08/21/2013  . GIB (gastrointestinal bleeding) 12/10/2011  . Diverticulosis of colon with hemorrhage 12/10/2011  . Hyperglycemia 12/10/2011  . A-fib ([place]) 12/10/2011  . Gallstones 09/14/2011    Past Surgical History:  Procedure Laterality Date  . BALLOON DILATION N/A 06/29/2014   Procedure: BALLOON DILATION;  Surgeon: Garlan Fair, MD;  Location: [facility];  Service: Endoscopy;  Laterality: N/A;  . CYSTOCELE REPAIR     x 2  . ESOPHAGOGASTRODUODENOSCOPY (EGD) WITH PROPOFOL N/A 06/29/2014   Procedure: ESOPHAGOGASTRODUODENOSCOPY (EGD) WITH PROPOFOL;  Surgeon: Garlan Fair, MD;  Location: [facility];  Service: Endoscopy;  Laterality: N/A;  . EYE SURGERY     bilateral cataracts with lens implants  . HIATAL HERNIA REPAIR    . HIP ARTHROPLASTY Left 08/22/2013   Procedure: ARTHROPLASTY BIPOLAR HIP;  Surgeon: Mauri Pole, MD;  Location: [facility];  Service: Orthopedics;  Laterality: Left;  . JOINT REPLACEMENT     left hip  . KYPHOSIS SURGERY  2010 & 2012   x3  . Laparoscopic reduction of intrathoracic stomach and repair of hiatal hernia    . NISSEN FUNDOPLICATION    . PACEMAKER INSERTION  03/29/08  For SSS. SJM Zephyr XL DR Implanted by Dr. Leonia Reeves.  . TONSILLECTOMY    . VAGINAL HYSTERECTOMY     partial    OB History    No data available       Home Medications    Prior to Admission medications   Medication Sig Start Date End Date Taking? Authorizing Provider  amiodarone (PACERONE) 200 MG tablet Take 0.5 tablets (100 mg total) by mouth daily. 07/30/16  Yes Thompson Grayer, MD  Calcium Carbonate-Vitamin D (CALCIUM 600 + D PO) Take 1 tablet by mouth daily.   Yes Historical Provider, MD  cetirizine (ZYRTEC) 10 MG tablet Take 10 mg by mouth at bedtime.   Yes Historical Provider, MD    cholecalciferol (VITAMIN D) 1000 UNITS tablet Take 1,000 Units by mouth daily.   Yes Historical Provider, MD  diltiazem (CARDIZEM CD) 120 MG 24 hr capsule Take 1 capsule (120 mg total) by mouth daily. 05/16/16  Yes Thompson Grayer, MD  furosemide (LASIX) 40 MG tablet Take 40 mg by mouth daily.    Yes Historical Provider, MD  gabapentin (NEURONTIN) 300 MG capsule Take 2 capsules by mouth at bedtime.  01/20/16  Yes Historical Provider, MD  HYDROcodone-acetaminophen (NORCO) 10-325 MG per tablet Take 1 tablet by mouth every 8 (eight) hours as needed for moderate pain. Back pain 12/16/14  Yes Historical Provider, MD  levothyroxine (SYNTHROID, LEVOTHROID) 50 MCG tablet Take 50 mcg by mouth daily before breakfast.   Yes Historical Provider, MD  losartan (COZAAR) 100 MG tablet Take 100 mg by mouth every morning.   Yes Historical Provider, MD  metoprolol (TOPROL-XL) 200 MG 24 hr tablet Take 200 mg by mouth every morning.    Yes Historical Provider, MD  Multiple Vitamin (MULTIVITAMIN WITH MINERALS) TABS tablet Take 1 tablet by mouth daily.   Yes Historical Provider, MD  pravastatin (PRAVACHOL) 40 MG tablet Take 40 mg by mouth daily.   Yes Historical Provider, MD  cyclobenzaprine (FLEXERIL) 10 MG tablet Take 1 tablet (10 mg total) by mouth 3 (three) times daily as needed for muscle spasms. 11/13/16   Jola Schmidt, MD    Family History Family History  Problem Relation Age of Onset  . Colon cancer Mother   . CAD Father   . CAD Sister     Social History Social History  Substance Use Topics  . Smoking status: Former Smoker    Types: Cigarettes    Quit date: 12/09/1978  . Smokeless tobacco: Never Used  . Alcohol use No     Allergies   Ace inhibitors; Aspirin; Sulfa drugs cross reactors; Atorvastatin; and Welchol [colesevelam hcl]   Review of Systems Review of Systems  All other systems reviewed and are negative.    Physical Exam Updated Vital Signs BP 176/84   Pulse 75   Temp 97.6 F (36.4 C)  (Oral)   Resp 12   SpO2 95%   Physical Exam  Constitutional: She is oriented to person, place, and time. She appears well-developed and well-nourished. No distress.  HENT:  Head: Normocephalic and atraumatic.  Eyes: EOM are normal.  Neck: Normal range of motion.  Cardiovascular: Normal rate, regular rhythm and normal heart sounds.   Pulmonary/Chest: Breath sounds normal.  Rales  Abdominal: Soft. She exhibits no distension. There is no tenderness.  Musculoskeletal:  2+ pitting edema bilaterally.  Changes of her lower extremities consistent with venous stasis  Neurological: She is alert and oriented to person, place, and time.  Skin: Skin is warm  and dry.  Psychiatric: She has a normal mood and affect. Judgment normal.  Nursing note and vitals reviewed.    ED Treatments / Results  Labs (all labs ordered are listed, but only abnormal results are displayed) Labs Reviewed  CBC - Abnormal; Notable for the following:       Result Value   RBC 3.59 (*)    Hemoglobin 10.7 (*)    HCT 32.7 (*)    All other components within normal limits  BASIC METABOLIC PANEL - Abnormal; Notable for the following:    Sodium 131 (*)    Chloride 99 (*)    Glucose, Bld 117 (*)    All other components within normal limits  URINALYSIS, ROUTINE W REFLEX MICROSCOPIC - Abnormal; Notable for the following:    Hgb urine dipstick SMALL (*)    Ketones, ur 20 (*)    Protein, ur 30 (*)    Squamous Epithelial / LPF 0-5 (*)    All other components within normal limits  BRAIN NATRIURETIC PEPTIDE - Abnormal; Notable for the following:    B Natriuretic Peptide 412.3 (*)    All other components within normal limits  TROPONIN I    EKG  EKG Interpretation  Date/Time:  Tuesday November 13 2016 21:45:53 EST Ventricular Rate:  83 PR Interval:    QRS Duration: 167 QT Interval:  478 QTC Calculation: 562 R Axis:   -75 Text Interpretation:  A-V dual-paced rhythm with some inhibition No further analysis attempted  due to paced rhythm Confirmed by Gabrille Kilbride  MD, Lennette Bihari ([id number]) on 11/13/2016 11:17:58 PM       Radiology Dg Chest 2 View  Result Date: 11/13/2016 CLINICAL DATA:  Fall 2 weeks ago, mid lower back pain for 2 days. Atrial fibrillation, diabetes, GERD, hypertension, former smoker. EXAM: CHEST  2 VIEW COMPARISON:  Chest x-rays dated 06/04/2014 and 08/21/2013. FINDINGS: There is mild cardiomegaly, stable. Atherosclerotic changes noted at the aortic arch. Left chest wall pacemaker in place with grossly stable positioning of the leads. Subtle opacities within each lung, most likely mild edema. No confluent opacity to suggest developing pneumonia. Probable small left pleural effusion. No pneumothorax seen. There are healing fractures of the left posterior fifth through eighth ribs. Osteopenia limits characterization of the thoracic spine but no obvious evidence of acute fracture or dislocation is seen in the thoracic spine. Patient is status post vertebroplasties at the thoracolumbar junction. IMPRESSION: 1. Healing fractures of the posterior left fifth through eighth ribs, likely subacute in age. 2. Cardiomegaly with mild interstitial edema suggesting mild volume overload/CHF. No evidence of pneumonia. 3. Probable small left pleural effusion. 4. Aortic atherosclerosis. Electronically Signed   By: Franki Cabot M.D.   On: 11/13/2016 16:04   Dg Thoracic Spine 2 View  Result Date: 11/13/2016 CLINICAL DATA:  Mid lower back pain x 2 days. Per notes-pt fell 2 weeks ago A-fib, DM, GERD, HTN, osteoarthritis, former smoker x 38 years ago Sx: kyphosis-2010/2012, pacemaker-03/29/2008 EXAM: THORACIC SPINE 2 VIEWS COMPARISON:  CT thoracic spine dated 04/18/2011. Chest x-ray dated 11/13/2016. FINDINGS: Status post vertebroplasties at the T11 through L1 levels, without significant change compared to recent chest x-rays. Osteopenia limits characterization of the majority of the thoracic spine but there is no acute fracture or  displacement seen. Immediate paravertebral soft tissues are unremarkable. IMPRESSION: Osteopenia.  No acute findings. Electronically Signed   By: Franki Cabot M.D.   On: 11/13/2016 16:12   Dg Lumbar Spine Complete  Result Date: 11/13/2016 CLINICAL DATA:  Mid lower back pain x 2 days. Per notes-pt fell 2 weeks ago A-fib, DM, GERD, HTN, osteoarthritis, former smoker x 38 years ago Sx: kyphosis-2010/2012, pacemaker-03/29/2008 EXAM: LUMBAR SPINE - COMPLETE 4+ VIEW COMPARISON:  CT abdomen dated 06/30/2015 FINDINGS: There is stable scoliosis of the thoracolumbar spine. Again noted are changes of vertebroplasties at the thoracolumbar junction. There is a stable severe compression fracture deformity at the L4 vertebral body. There is an additional compression fracture of the L3 vertebral body, moderate in degree, also chronic in appearance, perhaps slightly worsened compared to the earlier CT. No acute appearing osseous abnormality identified. Atherosclerotic changes noted along the walls of the infrarenal abdominal aorta. Visualized paravertebral soft tissues are otherwise unremarkable. IMPRESSION: 1. No acute findings. Stable severe compression fracture deformity of the L4 vertebral body. Additional compression fracture deformity of the L3 vertebral body, moderate in degree, also chronic in appearance although perhaps slightly worsened in degree compared to the earlier CT of 06/30/2015. 2. No acute findings seen. 3. Aortic atherosclerosis. Electronically Signed   By: Franki Cabot M.D.   On: 11/13/2016 16:09   Ct Renal Stone Study  Result Date: 11/13/2016 CLINICAL DATA:  83 y/o F; history of chronic back pain status post fall 2 weeks ago with flank pain and nausea. EXAM: CT ABDOMEN AND PELVIS WITHOUT CONTRAST TECHNIQUE: Multidetector CT imaging of the abdomen and pelvis was performed following the standard protocol without IV contrast. COMPARISON:  None. FINDINGS: Lower chest: Small bilateral pleural effusions.  Dependent ground-glass opacities in the lungs probably represents associated atelectasis. Moderate cardiomegaly. Extensive coronary artery calcification. Partially visualized pacemaking wires. Hepatobiliary: No focal liver lesion identified. Cholelithiasis. No evidence for cholecystitis. No intra or extrahepatic biliary ductal dilatation. Pancreas: Unremarkable. No pancreatic ductal dilatation or surrounding inflammatory changes. Spleen: Normal in size without focal abnormality. Adrenals/Urinary Tract: Adrenal glands are unremarkable. Kidneys are normal, without renal calculi, focal lesion, or hydronephrosis. Bladder is unremarkable. Stomach/Bowel: No obstructive or inflammatory changes of the bowel. Normal appendix. Extensive sigmoid diverticulosis without evidence for diverticulitis. Surgical clips at the gastroesophageal junction prior hernia repair. Vascular/Lymphatic: Aortic atherosclerosis. No enlarged abdominal or pelvic lymph nodes. Reproductive: Pelvis is largely obscured by artifact from hip hardware. Status post hysterectomy. No discrete adnexal lesion identified. Other: Diastases rectus.  No significant herniation. Musculoskeletal: Stable compression deformities of the T11, T12, and L1 vertebral bodies post kyphoplasty, superior endplate deformity of the L3 vertebral body, and severe compression deformity of the L4 vertebral body. Multiple chronic rib fracture deformities are present bilaterally. Left hip hemiarthroplasty noted without apparent hardware related complication. No acute fracture is identified. IMPRESSION: 1. No acute process of the abdomen or pelvis identified as explanation for pain. 2. Small bilateral pleural effusions. 3. Moderate cardiomegaly and severe coronary artery calcification. 4. Cholelithiasis. 5. Extensive sigmoid diverticulosis without evidence for diverticulitis. 6. Aortic atherosclerosis with moderate to severe calcification. 7. Numerous chronic rib fractures. Several stable  compression deformities of the thoracic and lumbar spine post kyphoplasty from T11 through L1. No acute fracture identified. Electronically Signed   By: Kristine Garbe M.D.   On: 11/13/2016 22:11    Procedures Procedures (including critical care time)  Medications Ordered in ED Medications  morphine 4 MG/ML injection 4 mg (4 mg Intravenous Given 11/13/16 1620)  ondansetron (ZOFRAN) injection 4 mg (4 mg Intravenous Given 11/13/16 1559)  prochlorperazine (COMPAZINE) tablet 10 mg (10 mg Oral Given 11/13/16 2008)  HYDROcodone-acetaminophen (NORCO/VICODIN) 5-325 MG per tablet 2 tablet (2 tablets Oral Given 11/13/16 2008)  ondansetron (  ZOFRAN) injection 4 mg (4 mg Intravenous Given 11/13/16 2322)  furosemide (LASIX) injection 40 mg (40 mg Intravenous Given 11/13/16 2322)     Initial Impression / Assessment and Plan / ED Course  I have reviewed the triage vital signs and the nursing notes.  Pertinent labs & imaging results that were available during my care of the patient were reviewed by me and considered in my medical decision making (see chart for details).  Clinical Course     Attempted to ambulate the patient and she is ambulating better this time although still having what appears to be spasms of pain.  She is nauseated and dry heaves several times.  Family is concerned about her lower extremity edema and low oxygen level.  I suspect this is mild CHF exacerbation.  IV Lasix now.  Pain treated.  CT stone study and plain films demonstrate no significant pathology.  No new compression fractures noted.  Patient be admitted for observation.  IV Lasix now.  Heart failure likely secondary to noncompliance.  Final Clinical Impressions(s) / ED Diagnoses   Final diagnoses:  Low back pain without sciatica, unspecified back pain laterality, unspecified chronicity  Lower extremity edema  Acute on chronic congestive heart failure, unspecified congestive heart failure type ([facility]CC)  Acute low back pain  without sciatica, unspecified back pain laterality  Nausea    New Prescriptions New Prescriptions   CYCLOBENZAPRINE (FLEXERIL) 10 MG TABLET    Take 1 tablet (10 mg total) by mouth 3 (three) times daily as needed for muscle spasms.     Jola Schmidt, MD 11/14/16 [phone]

## 2016-11-14 NOTE — H&P (Signed)
History and Physical  Patient Name: [name]     [id number]    DOB: [birth year]    DOA: 11/13/2016 PCP: Mayra Neer, MD   Patient coming from: Home  Chief Complaint: Leg swelling, dyspnea on exertion, back pain  HPI: [name] is a 83 y.o. female with a past medical history significant for Afib not on warfarin 2/2 diverticular hemorrhage, HFpEF, sick sinus with pacer, HTN, and hypothyroidism who presents with leg swelling, dyspnea for 2 weeks and new low back pain.  With regard to dyspnea, the patient's daughter describes that she has been noticing a few weeks of increased leg swelling which she was initially trying to treat with TED hose. Then last week, daughter was unable to care for her mother for a few days due to flu, and afterwards she noticed her leg swelling was worse, and she now had shortness of breath with exertion (she gets out regularly to church only, and is otherwise limited to walking short distances only).  Furthermore, then the daughter discovered that her mother wasn't taking any of her medicines, possibly for weeks or more, so she brought her to the ER.  In the meantime, the patient was complaining over the last 2-3 days of increased back pain.  This is in the mid back, is worse with movement or being repositioned.  Is not associated with fever, leg weakness, bowel or blader incontinence.  ED course: -Afebrile, heart rate 66, respirations normal, pulse ox 90% on room air, desaturations with exertion, blood pressure 154/64 -Na 131, K 3.6, Cr 0.62, WBC 6.9K, Hgb 10.7 -Urinalysis showed ketones but no pyuria or hematuria -Troponin negative, BNP 412 -Radiograph of the lumbar and thoracic spine showed only old stable compression fractures from several years ago -Chest x-ray showed mild edema -CT of the abdomen and pelvis without contrast showed some small pleural effusions but no explanation for the patient's back pain -She was given furosemide for CHF and  morphine for back pain, and TSH were asked evaluate for acute on chronic diastolic CHF      ROS: Review of Systems  Constitutional: Negative for chills and fever.  Respiratory: Positive for shortness of breath. Negative for sputum production and wheezing.   Cardiovascular: Positive for orthopnea (worse cough with supine) and leg swelling. Negative for chest pain and PND.  Musculoskeletal: Positive for back pain.  All other systems reviewed and are negative.         Past Medical History:  Diagnosis Date  . Afib ([place])    No Coumadin secondary to history of GI bleed.Marland Kitchen ECHO 12/04/11 LVEF estimated by 2D at 60-65%.  . Allergy   . CKD (chronic kidney disease) stage 2, GFR 60-89 ml/min   . Diabetes mellitus with renal manifestation ([place])   . Diastolic dysfunction   . Diverticulosis of colon with hemorrhage   . Esophageal reflux   . Fracture of T11 vertebra ([place]) 2010   Compression fracture  . GERD (gastroesophageal reflux disease)    s/p Nissen Repair, Dr. Hassell Done  . History of GI diverticular bleed 11/2011   ASA stopped  . Hyperlipidemia   . Hypertension   . Hypothyroidism   . Iron deficiency anemia secondary to blood loss (chronic)   . Mixed hyperlipidemia   . Osteoarthritis   . Sick sinus syndrome ([facility])    s/p PPM  . Skin cancer of nose    Removed by derm  . Unspecified vitamin D deficiency     Past Surgical History:  Procedure Laterality Date  . BALLOON DILATION N/A 06/29/2014   Procedure: BALLOON DILATION;  Surgeon: Garlan Fair, MD;  Location: [facility];  Service: Endoscopy;  Laterality: N/A;  . CYSTOCELE REPAIR     x 2  . ESOPHAGOGASTRODUODENOSCOPY (EGD) WITH PROPOFOL N/A 06/29/2014   Procedure: ESOPHAGOGASTRODUODENOSCOPY (EGD) WITH PROPOFOL;  Surgeon: Garlan Fair, MD;  Location: [facility];  Service: Endoscopy;  Laterality: N/A;  . EYE SURGERY     bilateral cataracts with lens implants  . HIATAL HERNIA REPAIR    . HIP ARTHROPLASTY Left 08/22/2013    Procedure: ARTHROPLASTY BIPOLAR HIP;  Surgeon: Mauri Pole, MD;  Location: [facility];  Service: Orthopedics;  Laterality: Left;  . JOINT REPLACEMENT     left hip  . KYPHOSIS SURGERY  2010 & 2012   x3  . Laparoscopic reduction of intrathoracic stomach and repair of hiatal hernia    . NISSEN FUNDOPLICATION    . PACEMAKER INSERTION  03/29/08   For SSS. SJM Zephyr XL DR Implanted by Dr. Leonia Reeves.  . TONSILLECTOMY    . VAGINAL HYSTERECTOMY     partial    Social History: Patient lives with her son.  Her daughter lives next door.  The patient walks unassisted around the house, with a walker sometimes outside it.  Gets out to the store maybe twice a month.  Goes to church weekly.  Ambulates around house usually without dyspnea.  Former smoker.    Allergies  Allergen Reactions  . Ace Inhibitors Cough  . Aspirin Nausea Only  . Sulfa Drugs Cross Reactors Nausea Only  . Atorvastatin Other (See Comments)    Leg swelling  . Welchol [Colesevelam Hcl] Hives    Family history: family history includes CAD in her father and sister; Colon cancer in her mother.  Prior to Admission medications   Medication Sig Start Date End Date Taking? Authorizing Provider  amiodarone (PACERONE) 200 MG tablet Take 0.5 tablets (100 mg total) by mouth daily. 07/30/16  Yes Thompson Grayer, MD  Calcium Carbonate-Vitamin D (CALCIUM 600 + D PO) Take 1 tablet by mouth daily.   Yes Historical Provider, MD  cetirizine (ZYRTEC) 10 MG tablet Take 10 mg by mouth at bedtime.   Yes Historical Provider, MD  cholecalciferol (VITAMIN D) 1000 UNITS tablet Take 1,000 Units by mouth daily.   Yes Historical Provider, MD  diltiazem (CARDIZEM CD) 120 MG 24 hr capsule Take 1 capsule (120 mg total) by mouth daily. 05/16/16  Yes Thompson Grayer, MD  furosemide (LASIX) 40 MG tablet Take 40 mg by mouth daily.    Yes Historical Provider, MD  gabapentin (NEURONTIN) 300 MG capsule Take 2 capsules by mouth at bedtime.  01/20/16  Yes Historical Provider, MD    HYDROcodone-acetaminophen (NORCO) 10-325 MG per tablet Take 1 tablet by mouth every 8 (eight) hours as needed for moderate pain. Back pain 12/16/14  Yes Historical Provider, MD  levothyroxine (SYNTHROID, LEVOTHROID) 50 MCG tablet Take 50 mcg by mouth daily before breakfast.   Yes Historical Provider, MD  losartan (COZAAR) 100 MG tablet Take 100 mg by mouth every morning.   Yes Historical Provider, MD  metoprolol (TOPROL-XL) 200 MG 24 hr tablet Take 200 mg by mouth every morning.    Yes Historical Provider, MD  Multiple Vitamin (MULTIVITAMIN WITH MINERALS) TABS tablet Take 1 tablet by mouth daily.   Yes Historical Provider, MD  pravastatin (PRAVACHOL) 40 MG tablet Take 40 mg by mouth daily.   Yes Historical Provider, MD  cyclobenzaprine (FLEXERIL) 10 MG tablet Take 1 tablet (10 mg total) by mouth 3 (three) times daily as needed for muscle spasms. 11/13/16   Jola Schmidt, MD       Physical Exam: BP (!) 156/75 (BP Location: Right Arm)   Pulse 78   Temp 97.5 F (36.4 C) (Oral)   Resp 18   Ht 5' (1.524 m)   SpO2 96%  General appearance: Frail elderly adult female, alert and in no acute distress.   Eyes: Anicteric, conjunctiva pink, lids and lashes normal. Pupils symmetric, small.   ENT: No nasal deformity, discharge, epistaxis.  Hearing normal. OP tacky dry without lesions.  Upper denntures. Neck: No neck masses.  Trachea midline.  No thyromegaly/tenderness. Lymph: No cervical or supraclavicular lymphadenopathy. Skin: Warm and dry.  No jaundice.  No suspicious rashes or lesions. Cardiac: RRR, nl S1-S2, SEM soft, present.  Capillary refill is brisk.  No JVD.  3+ pitting LE edema to knees.  Radial pulses 2+ and symmetric. Respiratory: Normal respiratory rate and rhythm.  CTAB without rales or wheezes that I appreciate. Abdomen: Abdomen soft.  Nonfocal TTP with voluntary guarding, no rebound. No ascites, distension, hepatosplenomegaly.   MSK: No deformities or effusions.  No cyanosis or  clubbing. Neuro: Cranial nerves grossly normal.  Sensation intact to light touch. Speech is fluent.  Muscle strength 5-/5 and symmetric.  No leg weakness.   Oriented to [facility], "hospital", 2018, recent holiday, January. Psych: Sensorium intact and responding to questions, attention normal.  Behavior appropriate.  Affect normal.  Judgment and insight appear normal.     Labs on Admission:  I have personally reviewed following labs and imaging studies: CBC:  Recent Labs Lab 11/13/16 1553  WBC 6.9  HGB 10.7*  HCT 32.7*  MCV 91.1  PLT 99991111   Basic Metabolic Panel:  Recent Labs Lab 11/13/16 1553  NA 131*  K 3.6  CL 99*  CO2 24  GLUCOSE 117*  BUN 8  CREATININE 0.62  CALCIUM 9.2   GFR: CrCl cannot be calculated (Unknown ideal weight.).  Liver Function Tests: No results for input(s): AST, ALT, ALKPHOS, BILITOT, PROT, ALBUMIN in the last 168 hours. No results for input(s): LIPASE, AMYLASE in the last 168 hours. No results for input(s): AMMONIA in the last 168 hours. Coagulation Profile: No results for input(s): INR, PROTIME in the last 168 hours. Cardiac Enzymes:  Recent Labs Lab 11/13/16 2258  TROPONINI <0.03   BNP (last 3 results) No results for input(s): PROBNP in the last 8760 hours. HbA1C: No results for input(s): HGBA1C in the last 72 hours. CBG: No results for input(s): GLUCAP in the last 168 hours. Lipid Profile: No results for input(s): CHOL, HDL, LDLCALC, TRIG, CHOLHDL, LDLDIRECT in the last 72 hours. Thyroid Function Tests: No results for input(s): TSH, T4TOTAL, FREET4, T3FREE, THYROIDAB in the last 72 hours. Anemia Panel: No results for input(s): VITAMINB12, FOLATE, FERRITIN, TIBC, IRON, RETICCTPCT in the last 72 hours. Sepsis Labs: Invalid input(s): PROCALCITONIN, LACTICIDVEN No results found for this or any previous visit (from the past 240 hour(s)).       Radiological Exams on Admission: Personally reviewed CXR shows mild edema: Dg Chest 2  View  Result Date: 11/13/2016 CLINICAL DATA:  Fall 2 weeks ago, mid lower back pain for 2 days. Atrial fibrillation, diabetes, GERD, hypertension, former smoker. EXAM: CHEST  2 VIEW COMPARISON:  Chest x-rays dated 06/04/2014 and 08/21/2013. FINDINGS: There is mild cardiomegaly, stable. Atherosclerotic changes noted at the aortic arch. Left chest  wall pacemaker in place with grossly stable positioning of the leads. Subtle opacities within each lung, most likely mild edema. No confluent opacity to suggest developing pneumonia. Probable small left pleural effusion. No pneumothorax seen. There are healing fractures of the left posterior fifth through eighth ribs. Osteopenia limits characterization of the thoracic spine but no obvious evidence of acute fracture or dislocation is seen in the thoracic spine. Patient is status post vertebroplasties at the thoracolumbar junction. IMPRESSION: 1. Healing fractures of the posterior left fifth through eighth ribs, likely subacute in age. 2. Cardiomegaly with mild interstitial edema suggesting mild volume overload/CHF. No evidence of pneumonia. 3. Probable small left pleural effusion. 4. Aortic atherosclerosis. Electronically Signed   By: Franki Cabot M.D.   On: 11/13/2016 16:04   Dg Thoracic Spine 2 View  Result Date: 11/13/2016 CLINICAL DATA:  Mid lower back pain x 2 days. Per notes-pt fell 2 weeks ago A-fib, DM, GERD, HTN, osteoarthritis, former smoker x 38 years ago Sx: kyphosis-2010/2012, pacemaker-03/29/2008 EXAM: THORACIC SPINE 2 VIEWS COMPARISON:  CT thoracic spine dated 04/18/2011. Chest x-ray dated 11/13/2016. FINDINGS: Status post vertebroplasties at the T11 through L1 levels, without significant change compared to recent chest x-rays. Osteopenia limits characterization of the majority of the thoracic spine but there is no acute fracture or displacement seen. Immediate paravertebral soft tissues are unremarkable. IMPRESSION: Osteopenia.  No acute findings.  Electronically Signed   By: Franki Cabot M.D.   On: 11/13/2016 16:12   Dg Lumbar Spine Complete  Result Date: 11/13/2016 CLINICAL DATA:  Mid lower back pain x 2 days. Per notes-pt fell 2 weeks ago A-fib, DM, GERD, HTN, osteoarthritis, former smoker x 38 years ago Sx: kyphosis-2010/2012, pacemaker-03/29/2008 EXAM: LUMBAR SPINE - COMPLETE 4+ VIEW COMPARISON:  CT abdomen dated 06/30/2015 FINDINGS: There is stable scoliosis of the thoracolumbar spine. Again noted are changes of vertebroplasties at the thoracolumbar junction. There is a stable severe compression fracture deformity at the L4 vertebral body. There is an additional compression fracture of the L3 vertebral body, moderate in degree, also chronic in appearance, perhaps slightly worsened compared to the earlier CT. No acute appearing osseous abnormality identified. Atherosclerotic changes noted along the walls of the infrarenal abdominal aorta. Visualized paravertebral soft tissues are otherwise unremarkable. IMPRESSION: 1. No acute findings. Stable severe compression fracture deformity of the L4 vertebral body. Additional compression fracture deformity of the L3 vertebral body, moderate in degree, also chronic in appearance although perhaps slightly worsened in degree compared to the earlier CT of 06/30/2015. 2. No acute findings seen. 3. Aortic atherosclerosis. Electronically Signed   By: Franki Cabot M.D.   On: 11/13/2016 16:09   Ct Renal Stone Study  Result Date: 11/13/2016 CLINICAL DATA:  83 y/o F; history of chronic back pain status post fall 2 weeks ago with flank pain and nausea. EXAM: CT ABDOMEN AND PELVIS WITHOUT CONTRAST TECHNIQUE: Multidetector CT imaging of the abdomen and pelvis was performed following the standard protocol without IV contrast. COMPARISON:  None. FINDINGS: Lower chest: Small bilateral pleural effusions. Dependent ground-glass opacities in the lungs probably represents associated atelectasis. Moderate cardiomegaly. Extensive  coronary artery calcification. Partially visualized pacemaking wires. Hepatobiliary: No focal liver lesion identified. Cholelithiasis. No evidence for cholecystitis. No intra or extrahepatic biliary ductal dilatation. Pancreas: Unremarkable. No pancreatic ductal dilatation or surrounding inflammatory changes. Spleen: Normal in size without focal abnormality. Adrenals/Urinary Tract: Adrenal glands are unremarkable. Kidneys are normal, without renal calculi, focal lesion, or hydronephrosis. Bladder is unremarkable. Stomach/Bowel: No obstructive or inflammatory  changes of the bowel. Normal appendix. Extensive sigmoid diverticulosis without evidence for diverticulitis. Surgical clips at the gastroesophageal junction prior hernia repair. Vascular/Lymphatic: Aortic atherosclerosis. No enlarged abdominal or pelvic lymph nodes. Reproductive: Pelvis is largely obscured by artifact from hip hardware. Status post hysterectomy. No discrete adnexal lesion identified. Other: Diastases rectus.  No significant herniation. Musculoskeletal: Stable compression deformities of the T11, T12, and L1 vertebral bodies post kyphoplasty, superior endplate deformity of the L3 vertebral body, and severe compression deformity of the L4 vertebral body. Multiple chronic rib fracture deformities are present bilaterally. Left hip hemiarthroplasty noted without apparent hardware related complication. No acute fracture is identified. IMPRESSION: 1. No acute process of the abdomen or pelvis identified as explanation for pain. 2. Small bilateral pleural effusions. 3. Moderate cardiomegaly and severe coronary artery calcification. 4. Cholelithiasis. 5. Extensive sigmoid diverticulosis without evidence for diverticulitis. 6. Aortic atherosclerosis with moderate to severe calcification. 7. Numerous chronic rib fractures. Several stable compression deformities of the thoracic and lumbar spine post kyphoplasty from T11 through L1. No acute fracture  identified. Electronically Signed   By: Kristine Garbe M.D.   On: 11/13/2016 22:11    EKG: Independently reviewed. Rate 83, AV paced.    Assessment/Plan  1. Acute on chronic diastolic CHF:  -Furosemide 40 mg IV daily -Strict I/Os, daily weights -Daily monitoring of electrolytes -Continue ARB and BB -Obtain echocardiogram -Check TSH   2. Back pain:  Unclear etiology.  No new compression fractures.  CT imaging is unrevealing.  -PT eval -Trial Robaxin cautiously  3. Chronic atrial fibrillation:  CHADS2Vasc 5 at least.  No warfarin because of history of diverticular bleeding. -Continue amiodarone -Continue diltiazem and BB  4. Hyponatremia:  Mild.  Hypervolemic.   -Check urine sodium, urine and serum Osms -Trend BMP daily  5. HTN:  -Continue ARB, BB, statin  6. Hypothyroidism:  -Continue levothyroxine  7. Other medications:  -Continue gabapentin     DVT prophylaxis: Lovenox  Code Status: FULL  Family Communication: Daughter at bedside  Disposition Plan: Anticipate IV diuresis and close monitoring of electrolytes.   Consults called: None Admission status: INPATIENT, tele        Medical decision making: Patient seen at 12:45 AM on 11/14/2016.  The patient was discussed with Dr. Venora Maples.  What exists of the patient's chart was reviewed in depth and summarized above.  Clinical condition: stable.        Edwin Dada [facility] Pager [phone]      At the time of admission, it appears that the appropriate admission status for this patient is INPATIENT. This is judged to be reasonable and necessary in order to provide the required intensity of service to ensure the patient's safety given the presenting symptoms, physical exam findings, and initial radiographic and laboratory data in the context of their chronic comorbidities.  Together, these circumstances are felt to place her at high risk for further clinical deterioration  threatening life, limb, or organ.   Patient requires inpatient status due to high intensity of service, high risk for further deterioration and high frequency of surveillance required because of this severe exacerbation of their chronic organ failure.  I certify that at the point of admission it is my clinical judgment that the patient will require inpatient hospital care spanning beyond 2 midnights from the point of admission and that early discharge would result in unnecessary risk of decompensation and readmission or threat to life, limb or bodily function.

## 2016-11-14 NOTE — Progress Notes (Signed)
PT Cancellation Note  Patient Details Name: [name] MRN: [id number] DOB: 02-12-1926   Cancelled Treatment:    Reason Eval/Treat Not Completed: Other (comment); defer PT at this time per nursing staff--pt stood earlier today but has been incontinent each time/since Lasix; pt now resting; will attempt again as schedule permits   [facility] 11/14/2016, 2:50 PM

## 2016-11-15 DIAGNOSIS — E871 Hypo-osmolality and hyponatremia: Secondary | ICD-10-CM

## 2016-11-15 LAB — BASIC METABOLIC PANEL
Anion gap: 11 (ref 5–15)
BUN: 10 mg/dL (ref 6–20)
CALCIUM: 8.7 mg/dL — AB (ref 8.9–10.3)
CHLORIDE: 90 mmol/L — AB (ref 101–111)
CO2: 31 mmol/L (ref 22–32)
CREATININE: 0.64 mg/dL (ref 0.44–1.00)
GFR calc Af Amer: >60 mL/min (ref >60)
GFR calc non Af Amer: >60 mL/min (ref >60)
Glucose, Bld: 87 mg/dL (ref 65–99)
Potassium: 3.3 mmol/L — ABNORMAL LOW (ref 3.5–5.1)
Sodium: 132 mmol/L — ABNORMAL LOW (ref 135–145)

## 2016-11-15 MED ORDER — FLUTICASONE PROPIONATE 50 MCG/ACT NA SUSP
1.0000 | Freq: Every day | NASAL | Status: DC
  Administered 2016-11-15 – 2016-11-17 (×3): 1 via NASAL
  Filled 2016-11-15 (×2): qty 16

## 2016-11-15 MED ORDER — SALINE SPRAY 0.65 % NA SOLN
1.0000 | NASAL | Status: DC | PRN
  Filled 2016-11-15: qty 44

## 2016-11-15 MED ORDER — POTASSIUM CHLORIDE CRYS ER 10 MEQ PO TBCR
40.0000 meq | EXTENDED_RELEASE_TABLET | Freq: Once | ORAL | Status: AC
  Administered 2016-11-15: 40 meq via ORAL
  Filled 2016-11-15: qty 4

## 2016-11-15 MED ORDER — ACETAMINOPHEN 325 MG PO TABS: 650.0000 mg | ORAL_TABLET | Freq: Four times a day (QID) | ORAL | Status: DC | PRN

## 2016-11-15 MED ORDER — DIPHENHYDRAMINE HCL 12.5 MG/5ML PO ELIX
12.5000 mg | ORAL_SOLUTION | Freq: Every evening | ORAL | Status: DC | PRN
  Administered 2016-11-15 – 2016-11-16 (×2): 12.5 mg via ORAL
  Filled 2016-11-15 (×2): qty 5

## 2016-11-15 NOTE — Clinical Social Work Note (Signed)
Clinical Social Work Assessment  Patient Details  Name: [name] MRN: [id number] Date of Birth: 10/04/1926  Date of referral:  11/15/16               Reason for consult:  Facility Placement                Permission sought to share information with:  Facility Contact Representative Permission granted to share information::  Yes, Verbal Permission Granted  Name::        Agency::     Relationship::     Contact Information:     Housing/Transportation Living arrangements for the past 2 months:  Single Family Home Source of Information:  Patient, Adult Children Patient Interpreter Needed:  None Criminal Activity/Legal Involvement Pertinent to Current Situation/Hospitalization:  No - Comment as needed Significant Relationships:  Adult Children Lives with:  Adult Children Do you feel safe going back to the place where you live?  No Need for family participation in patient care:  Yes (Comment)  Care giving concerns:  CSW received consult for SNF placement.    Social Worker assessment / plan:  CSW met with patient & son, [name] at bedside re: discharge planning. Patient is agreeable to go to SNF, requesting [facility] - awaiting response from Heather at [facility] re: bed offer.   Employment status:  Retired Insurance information:  Managed Medicare PT Recommendations:  Skilled Nursing Facility Information / Referral to community resources:  Skilled Nursing Facility  Patient/Family's Response to care:    Patient/Family's Understanding of and Emotional Response to Diagnosis, Current Treatment, and Prognosis:    Emotional Assessment Appearance:  Appears stated age Attitude/Demeanor/Rapport:    Affect (typically observed):    Orientation:  Oriented to Self, Oriented to Place, Oriented to  Time, Oriented to Situation Alcohol / Substance use:    Psych involvement (Current and /or in the community):     Discharge Needs  Concerns to be addressed:    Readmission within  the last 30 days:    Current discharge risk:    Barriers to Discharge:      ,  F, LCSW 11/15/2016, 10:42 AM  

## 2016-11-15 NOTE — Clinical Social Work Placement (Signed)
   CLINICAL SOCIAL WORK PLACEMENT  NOTE  Date:  11/15/2016  Patient Details  Name: [name] MRN: [id number] Date of Birth: 16-Nov-1925  Clinical Social Work is seeking post-discharge placement for this patient at the [place] level of care (*CSW will initial, date and re-position this form in  chart as items are completed):  Yes   Patient/family provided with [place] Work Department's list of facilities offering this level of care within the geographic area requested by the patient (or if unable, by the patient's family).  Yes   Patient/family informed of their freedom to choose among providers that offer the needed level of care, that participate in Medicare, Medicaid or managed care program needed by the patient, have an available bed and are willing to accept the patient.  Yes   Patient/family informed of [facility]'s ownership interest in [facility] and [facility], as well as of the fact that they are under no obligation to receive care at these facilities.  PASRR submitted to EDS on       PASRR number received on       Existing PASRR number confirmed on 11/15/16     FL2 transmitted to all facilities in geographic area requested by pt/family on 11/15/16     FL2 transmitted to all facilities within larger geographic area on       Patient informed that his/her managed care company has contracts with or will negotiate with certain facilities, including the following:            Patient/family informed of bed offers received.  Patient chooses bed at       Physician recommends and patient chooses bed at      Patient to be transferred to   on  .  Patient to be transferred to facility by       Patient family notified on   of transfer.  Name of family member notified:        PHYSICIAN       Additional Comment:    _______________________________________________ Standley Brooking, LCSW 11/15/2016, 10:44 AM

## 2016-11-15 NOTE — Progress Notes (Signed)
SATURATION QUALIFICATIONS: (This note is used to comply with regulatory documentation for home oxygen)  Patient Saturations on Room Air at Rest = 98%  Patient Saturations on Room Air while Ambulating = 86%  Patient Saturations on 1 Liters of oxygen while Ambulating = 93%  Please briefly explain why patient needs home oxygen:

## 2016-11-15 NOTE — Progress Notes (Addendum)
PROGRESS NOTE    [name]  [id number] DOB: August 24, 1926 DOA: 11/13/2016 PCP: Mayra Neer, MD    Brief Narrative:  83 y.o. female with a past medical history significant for Afib not on warfarin 2/2 diverticular hemorrhage, HFpEF, sick sinus with pacer, HTN, and hypothyroidism who presents with leg swelling, dyspnea for 2 weeks and new low back pain.  With regard to dyspnea, the patient's daughter describes that she has been noticing a few weeks of increased leg swelling which she was initially trying to treat with TED hose. Then last week, daughter was unable to care for her mother for a few days due to flu, and afterwards she noticed her leg swelling was worse, and she now had shortness of breath with exertion (she gets out regularly to church only, and is otherwise limited to walking short distances only).  Furthermore, then the daughter discovered that her mother wasn't taking any of her medicines, possibly for weeks or more, so she brought her to the ER.  In the meantime, the patient was complaining over the last 2-3 days of increased back pain.  This is in the mid back, is worse with movement or being repositioned.  Is not associated with fever, leg weakness, bowel or blader incontinence  Patient was admitted for acute on chronic CHF.  Assessment & Plan:   Principal Problem:   Acute on chronic diastolic CHF (congestive heart failure) (HCC) Active Problems:   Chronic atrial fibrillation (HCC)   Hyponatremia   Hypothyroidism, acquired   Sick sinus syndrome ([place])   Pacemaker   Essential hypertension   Back pain  1. Acute on chronic diastolic CHF:  -Patient continued on Furosemide 40 mg IV daily -will continue with strict I/Os, daily weights -Wt today 58.9 from 60.8kg -Continued on ARB and BB -2d echo reviewed. Normal LVEF with indeterminant diastolic function secondary to afib -TSH reviewed. Within normal limits -LE edema improving -Patient followed closely by  Cardiology as outpatient. Will ask for assistance while admitted - Check bmet in AM  2. Back pain:  -Unclear etiology.  No new compression fractures.  CT imaging is unrevealing.  -PT eval requested -Robaxin and analgesics as tolerated  3. Chronic atrial fibrillation:  CHADS2Vasc of at least5.  Holding coumadin secondary to history of diverticular bleeding. -Continue amiodarone -Continue diltiazem and BB - remains rate controlled  4. Hyponatremia:  Mild.  Hypervolemic.   -Check urine sodium, urine and serum Osms -Stable. Will recheck bmet in AM  5. HTN:  -Continue ARB, BB, statin --BP stable. Cont current regimen  6. Hypothyroidism:  -Continue levothyroxine as tolerated - normal TSH  7. Other medications:  -Continue gabapentin continued  DVT prophylaxis: Lovenox subQ Code Status: Full Family Communication: Pt in room, family not at bedside Disposition Plan: SNF, timing uncertain  Consultants:   Cardiology  Procedures:     Antimicrobials: Anti-infectives    None      Subjective: Without complaints  Objective: Vitals:   11/14/16 1448 11/14/16 2127 11/15/16 0615 11/15/16 0924  BP: (!) 148/76 (!) 145/75 (!) 148/62 127/61  Pulse: 95 89 76 78  Resp: 20 20 20    Temp: 97.5 F (36.4 C) 97.4 F (36.3 C) 98 F (36.7 C)   TempSrc: Oral Oral Oral   SpO2: 96% 97% 96% 94%  Weight:   58.9 kg (129 lb 13.6 oz)   Height:        Intake/Output Summary (Last 24 hours) at 11/15/16 1425 Last data filed at 11/15/16 [id number]  Gross  per 24 hour  Intake                0 ml  Output              500 ml  Net             -500 ml   Filed Weights   11/14/16 0609 11/15/16 0615  Weight: 60.8 kg (134 lb 0.6 oz) 58.9 kg (129 lb 13.6 oz)    Examination:  General exam: Laying in bed, in nad  Respiratory system: normal resp effort, no audible wheezing Cardiovascular system: regular rate, s1, s2 Gastrointestinal system: soft, nondistended Central nervous system: cn2-12  grossly intact, strength intact Extremities: perfused, no clubbing Skin: normal skin turgor, no notable skin lesions seen Psychiatry: mood normal// no visual hallucinations.   Data Reviewed: I have personally reviewed following labs and imaging studies  CBC:  Recent Labs Lab 11/13/16 1553  WBC 6.9  HGB 10.7*  HCT 32.7*  MCV 91.1  PLT 99991111   Basic Metabolic Panel:  Recent Labs Lab 11/13/16 1553 11/15/16 0522  NA 131* 132*  K 3.6 3.3*  CL 99* 90*  CO2 24 31  GLUCOSE 117* 87  BUN 8 10  CREATININE 0.62 0.64  CALCIUM 9.2 8.7*   GFR: Estimated Creatinine Clearance: 39.1 mL/min (by C-G formula based on SCr of 0.64 mg/dL). Liver Function Tests: No results for input(s): AST, ALT, ALKPHOS, BILITOT, PROT, ALBUMIN in the last 168 hours. No results for input(s): LIPASE, AMYLASE in the last 168 hours. No results for input(s): AMMONIA in the last 168 hours. Coagulation Profile: No results for input(s): INR, PROTIME in the last 168 hours. Cardiac Enzymes:  Recent Labs Lab 11/13/16 2258  TROPONINI <0.03   BNP (last 3 results) No results for input(s): PROBNP in the last 8760 hours. HbA1C: No results for input(s): HGBA1C in the last 72 hours. CBG: No results for input(s): GLUCAP in the last 168 hours. Lipid Profile: No results for input(s): CHOL, HDL, LDLCALC, TRIG, CHOLHDL, LDLDIRECT in the last 72 hours. Thyroid Function Tests:  Recent Labs  11/14/16 0557  TSH 3.006   Anemia Panel: No results for input(s): VITAMINB12, FOLATE, FERRITIN, TIBC, IRON, RETICCTPCT in the last 72 hours. Sepsis Labs: No results for input(s): PROCALCITON, LATICACIDVEN in the last 168 hours.  No results found for this or any previous visit (from the past 240 hour(s)).   Radiology Studies: Dg Chest 2 View  Result Date: 11/13/2016 CLINICAL DATA:  Fall 2 weeks ago, mid lower back pain for 2 days. Atrial fibrillation, diabetes, GERD, hypertension, former smoker. EXAM: CHEST  2 VIEW COMPARISON:   Chest x-rays dated 06/04/2014 and 08/21/2013. FINDINGS: There is mild cardiomegaly, stable. Atherosclerotic changes noted at the aortic arch. Left chest wall pacemaker in place with grossly stable positioning of the leads. Subtle opacities within each lung, most likely mild edema. No confluent opacity to suggest developing pneumonia. Probable small left pleural effusion. No pneumothorax seen. There are healing fractures of the left posterior fifth through eighth ribs. Osteopenia limits characterization of the thoracic spine but no obvious evidence of acute fracture or dislocation is seen in the thoracic spine. Patient is status post vertebroplasties at the thoracolumbar junction. IMPRESSION: 1. Healing fractures of the posterior left fifth through eighth ribs, likely subacute in age. 2. Cardiomegaly with mild interstitial edema suggesting mild volume overload/CHF. No evidence of pneumonia. 3. Probable small left pleural effusion. 4. Aortic atherosclerosis. Electronically Signed   By: Franki Cabot  M.D.   On: 11/13/2016 16:04   Dg Thoracic Spine 2 View  Result Date: 11/13/2016 CLINICAL DATA:  Mid lower back pain x 2 days. Per notes-pt fell 2 weeks ago A-fib, DM, GERD, HTN, osteoarthritis, former smoker x 38 years ago Sx: kyphosis-2010/2012, pacemaker-03/29/2008 EXAM: THORACIC SPINE 2 VIEWS COMPARISON:  CT thoracic spine dated 04/18/2011. Chest x-ray dated 11/13/2016. FINDINGS: Status post vertebroplasties at the T11 through L1 levels, without significant change compared to recent chest x-rays. Osteopenia limits characterization of the majority of the thoracic spine but there is no acute fracture or displacement seen. Immediate paravertebral soft tissues are unremarkable. IMPRESSION: Osteopenia.  No acute findings. Electronically Signed   By: Franki Cabot M.D.   On: 11/13/2016 16:12   Dg Lumbar Spine Complete  Result Date: 11/13/2016 CLINICAL DATA:  Mid lower back pain x 2 days. Per notes-pt fell 2 weeks ago  A-fib, DM, GERD, HTN, osteoarthritis, former smoker x 38 years ago Sx: kyphosis-2010/2012, pacemaker-03/29/2008 EXAM: LUMBAR SPINE - COMPLETE 4+ VIEW COMPARISON:  CT abdomen dated 06/30/2015 FINDINGS: There is stable scoliosis of the thoracolumbar spine. Again noted are changes of vertebroplasties at the thoracolumbar junction. There is a stable severe compression fracture deformity at the L4 vertebral body. There is an additional compression fracture of the L3 vertebral body, moderate in degree, also chronic in appearance, perhaps slightly worsened compared to the earlier CT. No acute appearing osseous abnormality identified. Atherosclerotic changes noted along the walls of the infrarenal abdominal aorta. Visualized paravertebral soft tissues are otherwise unremarkable. IMPRESSION: 1. No acute findings. Stable severe compression fracture deformity of the L4 vertebral body. Additional compression fracture deformity of the L3 vertebral body, moderate in degree, also chronic in appearance although perhaps slightly worsened in degree compared to the earlier CT of 06/30/2015. 2. No acute findings seen. 3. Aortic atherosclerosis. Electronically Signed   By: Franki Cabot M.D.   On: 11/13/2016 16:09   Ct Renal Stone Study  Result Date: 11/13/2016 CLINICAL DATA:  83 y/o F; history of chronic back pain status post fall 2 weeks ago with flank pain and nausea. EXAM: CT ABDOMEN AND PELVIS WITHOUT CONTRAST TECHNIQUE: Multidetector CT imaging of the abdomen and pelvis was performed following the standard protocol without IV contrast. COMPARISON:  None. FINDINGS: Lower chest: Small bilateral pleural effusions. Dependent ground-glass opacities in the lungs probably represents associated atelectasis. Moderate cardiomegaly. Extensive coronary artery calcification. Partially visualized pacemaking wires. Hepatobiliary: No focal liver lesion identified. Cholelithiasis. No evidence for cholecystitis. No intra or extrahepatic biliary  ductal dilatation. Pancreas: Unremarkable. No pancreatic ductal dilatation or surrounding inflammatory changes. Spleen: Normal in size without focal abnormality. Adrenals/Urinary Tract: Adrenal glands are unremarkable. Kidneys are normal, without renal calculi, focal lesion, or hydronephrosis. Bladder is unremarkable. Stomach/Bowel: No obstructive or inflammatory changes of the bowel. Normal appendix. Extensive sigmoid diverticulosis without evidence for diverticulitis. Surgical clips at the gastroesophageal junction prior hernia repair. Vascular/Lymphatic: Aortic atherosclerosis. No enlarged abdominal or pelvic lymph nodes. Reproductive: Pelvis is largely obscured by artifact from hip hardware. Status post hysterectomy. No discrete adnexal lesion identified. Other: Diastases rectus.  No significant herniation. Musculoskeletal: Stable compression deformities of the T11, T12, and L1 vertebral bodies post kyphoplasty, superior endplate deformity of the L3 vertebral body, and severe compression deformity of the L4 vertebral body. Multiple chronic rib fracture deformities are present bilaterally. Left hip hemiarthroplasty noted without apparent hardware related complication. No acute fracture is identified. IMPRESSION: 1. No acute process of the abdomen or pelvis identified as explanation for pain.  2. Small bilateral pleural effusions. 3. Moderate cardiomegaly and severe coronary artery calcification. 4. Cholelithiasis. 5. Extensive sigmoid diverticulosis without evidence for diverticulitis. 6. Aortic atherosclerosis with moderate to severe calcification. 7. Numerous chronic rib fractures. Several stable compression deformities of the thoracic and lumbar spine post kyphoplasty from T11 through L1. No acute fracture identified. Electronically Signed   By: Kristine Garbe M.D.   On: 11/13/2016 22:11    Scheduled Meds: . amiodarone  100 mg Oral Daily  . diltiazem  120 mg Oral Daily  . enoxaparin (LOVENOX)  injection  40 mg Subcutaneous Q24H  . fluticasone  1 spray Each Nare Daily  . furosemide  40 mg Intravenous Daily  . gabapentin  600 mg Oral QHS  . levothyroxine  50 mcg Oral QAC breakfast  . losartan  100 mg Oral q morning - 10a  . metoprolol  200 mg Oral q morning - 10a  . polyethylene glycol  17 g Oral Daily  . potassium chloride  40 mEq Oral Once  . potassium chloride  40 mEq Oral Daily  . pravastatin  40 mg Oral Daily   Continuous Infusions:   LOS: 1 day   CHIU, Orpah Melter, MD [facility] Pager [phone]  If 7PM-7AM, please contact night-coverage [email or web address] Password [email or web address] 11/15/2016, 2:25 PM

## 2016-11-15 NOTE — Clinical Social Work Placement (Signed)
Patient has a bed at [facility]. CSW has completed FL2 & will continue to follow and assist with discharge when ready.    Raynaldo Opitz, [facility] Clinical Social Worker cell #: [phone]     CLINICAL SOCIAL WORK PLACEMENT  NOTE  Date:  11/15/2016  Patient Details  Name: [name] MRN: [id number] Date of Birth: 25-Aug-1926  Clinical Social Work is seeking post-discharge placement for this patient at the [place] level of care (*CSW will initial, date and re-position this form in  chart as items are completed):  Yes   Patient/family provided with [facility] Work Department's list of facilities offering this level of care within the geographic area requested by the patient (or if unable, by the patient's family).  Yes   Patient/family informed of their freedom to choose among providers that offer the needed level of care, that participate in Medicare, Medicaid or managed care program needed by the patient, have an available bed and are willing to accept the patient.  Yes   Patient/family informed of [facility]'s ownership interest in [facility] and [facility], as well as of the fact that they are under no obligation to receive care at these facilities.  PASRR submitted to EDS on       PASRR number received on       Existing PASRR number confirmed on 11/15/16     FL2 transmitted to all facilities in geographic area requested by pt/family on 11/15/16     FL2 transmitted to all facilities within larger geographic area on       Patient informed that his/her managed care company has contracts with or will negotiate with certain facilities, including the following:        Yes   Patient/family informed of bed offers received.  Patient chooses bed at [place], [place]     Physician recommends and patient chooses bed at      Patient to be transferred to [place], [place] on   .  Patient to be transferred to facility by       Patient family notified on   of transfer.  Name of family member notified:        PHYSICIAN       Additional Comment:    _______________________________________________ Standley Brooking, LCSW 11/15/2016, 1:52 PM

## 2016-11-15 NOTE — Evaluation (Signed)
Physical Therapy Evaluation Patient Details Name: [name] MRN: [id number] DOB: 1926/10/28 Today's Date: 11/15/2016   History of Present Illness  83 y.o. female with a past medical history significant for Afib not on warfarin 2/2 diverticular hemorrhage, HFpEF, sick sinus with pacer, HTN, and hypothyroidism, chronic compression fractures, s/p kyphosis surgeries and admitted 11/13/16 for acute on chronic CHF  Clinical Impression  Pt admitted with above diagnosis. Pt currently with functional limitations due to the deficits listed below (see PT Problem List).  Pt will benefit from skilled PT to increase their independence and safety with mobility to allow discharge to the venue listed below.  Pt reports falling with her back against a door when her dog threw her off balance prior to onset of severe back pain (also with hx of compression fractures and kyphosis surgeries).  Pt now requiring at least mod assist for mobility as well as supplemental oxygen.  Recommend SNF upon d/c.  SpO2 dropped to 86% on room air with transferring so 1L O2 [place] reapplied and SPO2 improved to 93% prior to leaving room. (RN notified)      Follow Up Recommendations SNF;Supervision/Assistance - 24 hour    Equipment Recommendations  None recommended by PT    Recommendations for Other Services       Precautions / Restrictions Precautions Precautions: Fall Precaution Comments: monitor sats      Mobility  Bed Mobility Overal bed mobility: Needs Assistance Bed Mobility: Supine to Sit     Supine to sit: Mod assist     General bed mobility comments: attempted log roll technique however pt repositioned herself, requires assist for trunk upright  Transfers Overall transfer level: Needs assistance Equipment used: Rolling walker (2 wheeled) Transfers: Sit to/from [facility] Sit to Stand: Min assist Stand pivot transfers: Min assist       General transfer comment: verbal cues for hand  placement, pt pulls up on RW so RW needed to be hold down, posterior bias upon standing requiring time to self correct; assisted to [facility] and then a few steps over to recliner  Ambulation/Gait             General Gait Details: declined ambulation today - states she hasn't walked in a week  Stairs            Wheelchair Mobility    Modified Rankin (Stroke Patients Only)       Balance Overall balance assessment: History of Falls;Needs assistance         Standing balance support: Bilateral upper extremity supported;During functional activity Standing balance-Leahy Scale: Poor Standing balance comment: requires UE support and external assist upon standing for posterior bias                             Pertinent Vitals/Pain Pain Assessment: Faces Faces Pain Scale: Hurts even more Pain Location: back and R LE Pain Descriptors / Indicators: Spasm;Shooting;Sharp Pain Intervention(s): Limited activity within patient's tolerance;Monitored during session;Repositioned;Premedicated before session    Home Living Family/patient expects to be discharged to:: Skilled nursing facility Living Arrangements: Children               Additional Comments: lives with son, agreeable to SNF upon d/c    Prior Function Level of Independence: Independent with assistive device(s)         Comments: uses cane or RW occasionally, for past week has not been able to mobilize well due to back pain  Hand Dominance        Extremity/Trunk Assessment        Lower Extremity Assessment Lower Extremity Assessment: Generalized weakness    Cervical / Trunk Assessment Cervical / Trunk Assessment: Kyphotic  Communication   Communication: No difficulties  Cognition Arousal/Alertness: Awake/alert Behavior During Therapy: WFL for tasks assessed/performed Overall Cognitive Status: Within Functional Limits for tasks assessed                      General Comments       Exercises     Assessment/Plan    PT Assessment Patient needs continued PT services  PT Problem List Decreased strength;Decreased activity tolerance;Decreased knowledge of use of DME;Decreased mobility;Decreased balance;Pain          PT Treatment Interventions Gait training;Functional mobility training;Therapeutic exercise;Therapeutic activities;DME instruction;Patient/family education;Balance training    PT Goals (Current goals can be found in the Care Plan section)  Acute Rehab PT Goals PT Goal Formulation: With patient Time For Goal Achievement: 11/29/16 Potential to Achieve Goals: Good    Frequency Min 3X/week   Barriers to discharge        Co-evaluation               End of Session Equipment Utilized During Treatment: Gait belt;Oxygen Activity Tolerance: Patient limited by fatigue Patient left: in chair;with call bell/phone within reach;with chair alarm set Nurse Communication: Mobility status         Time: 0950-1020 PT Time Calculation (min) (ACUTE ONLY): 30 min   Charges:   PT Evaluation $PT Eval Moderate Complexity: 1 Procedure PT Treatments $Therapeutic Activity: 8-22 mins   PT G Codes:        [name],[name] 11/15/2016, 12:20 PM Carmelia Bake, PT, DPT 11/15/2016 Pager: [phone]

## 2016-11-15 NOTE — Progress Notes (Signed)
Chaplain consult to assist in completing advance directive.  [name]'s children had taken paperwork home.  Spoke with [name] at bedside.  In conversation, it was not clear that [name] understands what Advance Directive is.   Chaplain provided education.  [name] was confused about scope of document.  Expresses that she has something similar at her church and that her children have already done this.    [name] will need to display competency to sign document.     Chaplain will assess further for competency as children request notarization.     [place] [address], [place]

## 2016-11-15 NOTE — Care Management Note (Signed)
Case Management Note  Patient Details  Name: [name] MRN: [id number] Date of Birth: Aug 01, 1926  Subjective/Objective:  PT-[facility]cc SNF. CSW already following.                  Action/Plan:d/c SNF.   Expected Discharge Date:                Expected Discharge Plan:  Skilled Nursing Facility  In-House Referral:  Clinical Social Work  Discharge planning Services  CM Consult  Post Acute Care Choice:    Choice offered to:     DME Arranged:    DME Agency:     HH Arranged:    [place] Agency:     Status of Service:  In process, will continue to follow  If discussed at Long Length of Stay Meetings, dates discussed:    Additional Comments:  Dessa Phi, RN 11/15/2016, 1:21 PM

## 2016-11-15 NOTE — [place] FL2 (Signed)
[place] MEDICAID FL2 LEVEL OF CARE SCREENING TOOL     IDENTIFICATION  Patient Name: [name] Birthdate: 04-20-1926 Sex: female Admission Date (Current Location): 11/13/2016  [facility] and [place] Number:  Herbalist and Address:  [facility],  [place] [place], [place]      Provider Number: [id number]  Attending Physician Name and Address:  Donne Hazel, MD  Relative Name and Phone Number:       Current Level of Care: Hospital Recommended Level of Care: [place] Prior Approval Number:    Date Approved/Denied:   PASRR Number: [id number]  Discharge Plan: SNF    Current Diagnoses: Patient Active Problem List   Diagnosis Date Noted  . Acute on chronic diastolic CHF (congestive heart failure) ([place]) 11/14/2016  . Back pain 11/14/2016  . Essential hypertension 04/07/2015  . Edema 12/20/2014  . Mixed hyperlipidemia 04/06/2014  . Sick sinus syndrome ([place]) 12/14/2013  . Pacemaker 12/14/2013  . Unspecified constipation 08/27/2013  . Hypokalemia 08/24/2013  . Hypomagnesemia 08/24/2013  . Diastolic CHF ([place]) 123456  . Hypothyroidism, acquired 08/23/2013  . Hyponatremia 08/21/2013  . Left displaced femoral neck fracture ([place]) 08/21/2013  . History of sick sinus syndrome 08/21/2013  . Breast cyst 08/21/2013  . GIB (gastrointestinal bleeding) 12/10/2011  . Diverticulosis of colon with hemorrhage 12/10/2011  . Hyperglycemia 12/10/2011  . Chronic atrial fibrillation ([place]) 12/10/2011  . Gallstones 09/14/2011    Orientation RESPIRATION BLADDER Height & Weight     Self, Time, Situation, Place  O2 (1L) Continent Weight: 129 lb 13.6 oz (58.9 kg) Height:  5' (152.4 cm)  BEHAVIORAL SYMPTOMS/MOOD NEUROLOGICAL BOWEL NUTRITION STATUS      Continent Diet (Heart)  AMBULATORY STATUS COMMUNICATION OF NEEDS Skin   Extensive Assist Verbally Normal                       Personal Care Assistance Level of Assistance   Bathing, Dressing Bathing Assistance: Limited assistance   Dressing Assistance: Limited assistance     Functional Limitations Info             SPECIAL CARE FACTORS FREQUENCY  PT (By licensed PT), OT (By licensed OT)     PT Frequency: 5 OT Frequency: 5            Contractures      Additional Factors Info  Code Status, Allergies Code Status Info: Fullcode Allergies Info: Ace Inhibitors, Aspirin, Sulfa Drugs Cross Reactors, Atorvastatin, Welchol Colesevelam Hcl           Current Medications (11/15/2016):  This is the current hospital active medication list Current Facility-Administered Medications  Medication Dose Route Frequency Provider Last Rate Last Dose  . amiodarone (PACERONE) tablet 100 mg  100 mg Oral Daily Edwin Dada, MD   100 mg at 11/15/16 0929  . diltiazem (CARDIZEM CD) 24 hr capsule 120 mg  120 mg Oral Daily Edwin Dada, MD   120 mg at 11/15/16 0929  . enoxaparin (LOVENOX) injection 40 mg  40 mg Subcutaneous Q24H Edwin Dada, MD   40 mg at 11/15/16 0929  . furosemide (LASIX) injection 40 mg  40 mg Intravenous Daily Edwin Dada, MD   40 mg at 11/14/16 1045  . gabapentin (NEURONTIN) capsule 600 mg  600 mg Oral QHS Edwin Dada, MD   600 mg at 11/14/16 2146  . HYDROcodone-acetaminophen (NORCO) 10-325 MG per tablet 1 tablet  1 tablet Oral  Q8H PRN Edwin Dada, MD   1 tablet at 11/15/16 0945  . levothyroxine (SYNTHROID, LEVOTHROID) tablet 50 mcg  50 mcg Oral QAC breakfast Edwin Dada, MD   50 mcg at 11/15/16 0929  . losartan (COZAAR) tablet 100 mg  100 mg Oral q morning - 10a Edwin Dada, MD   100 mg at 11/15/16 0929  . methocarbamol (ROBAXIN) tablet 500 mg  500 mg Oral Q8H PRN Edwin Dada, MD      . metoprolol succinate (TOPROL-XL) 24 hr tablet 200 mg  200 mg Oral q morning - 10a Edwin Dada, MD   200 mg at 11/15/16 0929  . polyethylene glycol (MIRALAX / GLYCOLAX)  packet 17 g  17 g Oral Daily Donne Hazel, MD   17 g at 11/15/16 0929  . potassium chloride SA (K-DUR,KLOR-CON) CR tablet 40 mEq  40 mEq Oral Daily Edwin Dada, MD   40 mEq at 11/15/16 0929  . pravastatin (PRAVACHOL) tablet 40 mg  40 mg Oral Daily Edwin Dada, MD   40 mg at 11/14/16 1841     Discharge Medications: Please see discharge summary for a list of discharge medications.  Relevant Imaging Results:  Relevant Lab Results:   Additional Information SSN: [id number]  Standley Brooking, LCSW

## 2016-11-16 DIAGNOSIS — I5033 Acute on chronic diastolic (congestive) heart failure: Secondary | ICD-10-CM

## 2016-11-16 DIAGNOSIS — I10 Essential (primary) hypertension: Secondary | ICD-10-CM

## 2016-11-16 DIAGNOSIS — M545 Low back pain, unspecified: Secondary | ICD-10-CM

## 2016-11-16 DIAGNOSIS — I509 Heart failure, unspecified: Secondary | ICD-10-CM

## 2016-11-16 DIAGNOSIS — Z7189 Other specified counseling: Secondary | ICD-10-CM

## 2016-11-16 DIAGNOSIS — I482 Chronic atrial fibrillation: Secondary | ICD-10-CM

## 2016-11-16 LAB — BASIC METABOLIC PANEL
ANION GAP: 10 (ref 5–15)
BUN: 9 mg/dL (ref 6–20)
CALCIUM: 8.6 mg/dL — AB (ref 8.9–10.3)
CHLORIDE: 91 mmol/L — AB (ref 101–111)
CO2: 31 mmol/L (ref 22–32)
CREATININE: 0.65 mg/dL (ref 0.44–1.00)
GFR calc non Af Amer: >60 mL/min (ref >60)
Glucose, Bld: 111 mg/dL — ABNORMAL HIGH (ref 65–99)
Potassium: 3.9 mmol/L (ref 3.5–5.1)
SODIUM: 132 mmol/L — AB (ref 135–145)

## 2016-11-16 MED ORDER — FUROSEMIDE 40 MG PO TABS
40.0000 mg | ORAL_TABLET | Freq: Every day | ORAL | Status: DC
  Administered 2016-11-17: 40 mg via ORAL
  Filled 2016-11-16: qty 1

## 2016-11-16 MED ORDER — SALINE SPRAY 0.65 % NA SOLN
1.0000 | NASAL | Status: DC | PRN
  Filled 2016-11-16: qty 44

## 2016-11-16 MED ORDER — MAGNESIUM CITRATE PO SOLN
1.0000 | Freq: Once | ORAL | Status: AC
  Administered 2016-11-16: 1 via ORAL
  Filled 2016-11-16: qty 296

## 2016-11-16 NOTE — Consult Note (Signed)
Cardiology Consult    Patient ID: [name] MRN: [id number], DOB/AGE: 06/02/1926   Admit date: 11/13/2016 Date of Consult: 11/16/2016  Primary Physician: Mayra Neer, MD Reason for Consult: CHF Primary Cardiologist: Dr. Irish Lack Primary Electrophysiologist: Dr. Rayann Heman Requesting Provider: Dr. Wyline Copas  History of Present Illness    [name] is a 83 y.o. female with past medical history of PAF (not on anticoagulation secondary to GI bleeds), SSS (s/p PPM 2009), chronic diastolic CHF (EF 123456 by echo in 2013),  HTN, HLD, and Stage 2 CKD who presented to [facility] ED on 11/14/2016 for worsening lower back pain following a fall two weeks prior.   Family reported she had been noncompliant with her medications, including her Lasix 40mg  daily, and had noted worsening lower extremity edema and dyspnea with exertion. Family usually assist with medication management but had been absent for several days due to illness and this is when she was mostly noncompliant. According to one daughter she had not been on Lasix in over a year and her PCP had taken her off of it, she thinks, because her kidneys were working right.   Initial labs showed a WBC of 6.9, Hgb 10.7, and platelets 263. Na+ 131, creatinine 0.62. BNP 412. Initial troponin negative. CXR showed healing fractures of the posterior left fifth through eighth ribs and cardiomegaly with mild interstitial edema suggesting mild volume overload/CHF. EKG showed AV paced rhythm, HR 83. Imaging showed no acute etiologies for her back pain.   She was started on IV Lasix 40mg  daily with a net output of -550 mL thus far, however weight is down from 134 lbs to 129 lbs.  Was 132 lbs at the time of her last office visit in 01/2016. A repeat echocardiogram has been obtained which shows a preserved EF of 55-60% with no regional WMA. PA peak pressure elevated to 54 mm Hg.    Past Medical History   Past Medical History:  Diagnosis Date  . Afib ([place])    No  Coumadin secondary to history of GI bleed.[facility] Kitchen ECHO 12/04/11 LVEF estimated by 2D at 60-65%.  . Allergy   . CKD (chronic kidney disease) stage 2, GFR 60-89 ml/min   . Diabetes mellitus with renal manifestation ([place])   . Diastolic dysfunction   . Diverticulosis of colon with hemorrhage   . Esophageal reflux   . Fracture of T11 vertebra ([place]) 2010   Compression fracture  . GERD (gastroesophageal reflux disease)    s/p Nissen Repair, Dr. Hassell Done  . History of GI diverticular bleed 11/2011   ASA stopped  . Hyperlipidemia   . Hypertension   . Hypothyroidism   . Iron deficiency anemia secondary to blood loss (chronic)   . Mixed hyperlipidemia   . Osteoarthritis   . Sick sinus syndrome ([facility])    s/p PPM  . Skin cancer of nose    Removed by derm  . Unspecified vitamin D deficiency     Past Surgical History:  Procedure Laterality Date  . BALLOON DILATION N/A 06/29/2014   Procedure: BALLOON DILATION;  Surgeon: Garlan Fair, MD;  Location: [facility];  Service: Endoscopy;  Laterality: N/A;  . CYSTOCELE REPAIR     x 2  . ESOPHAGOGASTRODUODENOSCOPY (EGD) WITH PROPOFOL N/A 06/29/2014   Procedure: ESOPHAGOGASTRODUODENOSCOPY (EGD) WITH PROPOFOL;  Surgeon: Garlan Fair, MD;  Location: [facility];  Service: Endoscopy;  Laterality: N/A;  . EYE SURGERY     bilateral cataracts with lens implants  . HIATAL HERNIA  REPAIR    . HIP ARTHROPLASTY Left 08/22/2013   Procedure: ARTHROPLASTY BIPOLAR HIP;  Surgeon: Mauri Pole, MD;  Location: [facility];  Service: Orthopedics;  Laterality: Left;  . JOINT REPLACEMENT     left hip  . KYPHOSIS SURGERY  2010 & 2012   x3  . Laparoscopic reduction of intrathoracic stomach and repair of hiatal hernia    . NISSEN FUNDOPLICATION    . PACEMAKER INSERTION  03/29/08   For SSS. SJM Zephyr XL DR Implanted by Dr. Leonia Reeves.  . TONSILLECTOMY    . VAGINAL HYSTERECTOMY     partial     Allergies  Allergies  Allergen Reactions  . Ace Inhibitors Cough  . Aspirin  Nausea Only  . Sulfa Drugs Cross Reactors Nausea Only  . Atorvastatin Other (See Comments)    Leg swelling  . Welchol [Colesevelam Hcl] Hives    Inpatient Medications    . amiodarone  100 mg Oral Daily  . diltiazem  120 mg Oral Daily  . enoxaparin (LOVENOX) injection  40 mg Subcutaneous Q24H  . fluticasone  1 spray Each Nare Daily  . furosemide  40 mg Intravenous Daily  . gabapentin  600 mg Oral QHS  . levothyroxine  50 mcg Oral QAC breakfast  . losartan  100 mg Oral q morning - 10a  . metoprolol  200 mg Oral q morning - 10a  . polyethylene glycol  17 g Oral Daily  . potassium chloride  40 mEq Oral Daily  . pravastatin  40 mg Oral Daily    Family History    Family History  Problem Relation Age of Onset  . Colon cancer Mother   . CAD Father   . CAD Sister     Social History    Social History   Social History  . Marital status: Widowed    Spouse name: N/A  . Number of children: N/A  . Years of education: N/A   Occupational History  . Not on file.   Social History Main Topics  . Smoking status: Former Smoker    Types: Cigarettes    Quit date: 12/09/1978  . Smokeless tobacco: Never Used  . Alcohol use No  . Drug use: No  . Sexual activity: No   Other Topics Concern  . Not on file   Social History Narrative  . No narrative on file     Review of Systems    General:  No chills, fever, night sweats or weight changes.  Cardiovascular:  No chest pain, orthopnea, palpitations, paroxysmal nocturnal dyspnea. Positive for dyspnea with exertion and lower extremity edema.  Dermatological: No rash, lesions/masses Respiratory: No cough, Positive for dyspnea Urologic: No hematuria, dysuria Abdominal:   No nausea, vomiting, diarrhea, bright red blood per rectum, melena, or hematemesis Neurologic:  No visual changes, wkns, changes in mental status. All other systems reviewed and are otherwise negative except as noted above.  Physical Exam    Blood pressure (!)  148/60, pulse 96, temperature 97.8 F (36.6 C), temperature source Oral, resp. rate 17, height 5' (1.524 m), weight 129 lb 13.6 oz (58.9 kg), SpO2 96 %.  General: Pleasant, elderly female appearing in NAD Psych: Normal affect. Neuro: Alert and oriented X 3. Moves all extremities spontaneously. HEENT: Normal  Neck: Supple without bruits or JVD. Lungs:  Resp regular and unlabored, CTA. Heart: RRR no s3, s4, or murmurs. Abdomen: Soft, non-tender, non-distended, BS + x 4.  Extremities: No clubbing, cyanosis or edema. DP/PT/Radials 2+ and  equal bilaterally.  Labs    Troponin (Point of Care Test) No results for input(s): TROPIPOC in the last 72 hours.  Recent Labs  11/13/16 2258  TROPONINI <0.03   Lab Results  Component Value Date   WBC 6.9 11/13/2016   HGB 10.7 (L) 11/13/2016   HCT 32.7 (L) 11/13/2016   MCV 91.1 11/13/2016   PLT 263 11/13/2016    Recent Labs Lab 11/16/16 0545  NA 132*  K 3.9  CL 91*  CO2 31  BUN 9  CREATININE 0.65  CALCIUM 8.6*  GLUCOSE 111*   No results found for: CHOL, HDL, LDLCALC, TRIG No results found for: [facility]   Radiology Studies    Dg Chest 2 View  Result Date: 11/13/2016 CLINICAL DATA:  Fall 2 weeks ago, mid lower back pain for 2 days. Atrial fibrillation, diabetes, GERD, hypertension, former smoker. EXAM: CHEST  2 VIEW COMPARISON:  Chest x-rays dated 06/04/2014 and 08/21/2013. FINDINGS: There is mild cardiomegaly, stable. Atherosclerotic changes noted at the aortic arch. Left chest wall pacemaker in place with grossly stable positioning of the leads. Subtle opacities within each lung, most likely mild edema. No confluent opacity to suggest developing pneumonia. Probable small left pleural effusion. No pneumothorax seen. There are healing fractures of the left posterior fifth through eighth ribs. Osteopenia limits characterization of the thoracic spine but no obvious evidence of acute fracture or dislocation is seen in the thoracic spine. Patient  is status post vertebroplasties at the thoracolumbar junction. IMPRESSION: 1. Healing fractures of the posterior left fifth through eighth ribs, likely subacute in age. 2. Cardiomegaly with mild interstitial edema suggesting mild volume overload/CHF. No evidence of pneumonia. 3. Probable small left pleural effusion. 4. Aortic atherosclerosis. Electronically Signed   By: Franki Cabot M.D.   On: 11/13/2016 16:04   Dg Thoracic Spine 2 View  Result Date: 11/13/2016 CLINICAL DATA:  Mid lower back pain x 2 days. Per notes-pt fell 2 weeks ago A-fib, DM, GERD, HTN, osteoarthritis, former smoker x 38 years ago Sx: kyphosis-2010/2012, pacemaker-03/29/2008 EXAM: THORACIC SPINE 2 VIEWS COMPARISON:  CT thoracic spine dated 04/18/2011. Chest x-ray dated 11/13/2016. FINDINGS: Status post vertebroplasties at the T11 through L1 levels, without significant change compared to recent chest x-rays. Osteopenia limits characterization of the majority of the thoracic spine but there is no acute fracture or displacement seen. Immediate paravertebral soft tissues are unremarkable. IMPRESSION: Osteopenia.  No acute findings. Electronically Signed   By: Franki Cabot M.D.   On: 11/13/2016 16:12   Dg Lumbar Spine Complete  Result Date: 11/13/2016 CLINICAL DATA:  Mid lower back pain x 2 days. Per notes-pt fell 2 weeks ago A-fib, DM, GERD, HTN, osteoarthritis, former smoker x 38 years ago Sx: kyphosis-2010/2012, pacemaker-03/29/2008 EXAM: LUMBAR SPINE - COMPLETE 4+ VIEW COMPARISON:  CT abdomen dated 06/30/2015 FINDINGS: There is stable scoliosis of the thoracolumbar spine. Again noted are changes of vertebroplasties at the thoracolumbar junction. There is a stable severe compression fracture deformity at the L4 vertebral body. There is an additional compression fracture of the L3 vertebral body, moderate in degree, also chronic in appearance, perhaps slightly worsened compared to the earlier CT. No acute appearing osseous abnormality  identified. Atherosclerotic changes noted along the walls of the infrarenal abdominal aorta. Visualized paravertebral soft tissues are otherwise unremarkable. IMPRESSION: 1. No acute findings. Stable severe compression fracture deformity of the L4 vertebral body. Additional compression fracture deformity of the L3 vertebral body, moderate in degree, also chronic in appearance although perhaps slightly worsened  in degree compared to the earlier CT of 06/30/2015. 2. No acute findings seen. 3. Aortic atherosclerosis. Electronically Signed   By: Franki Cabot M.D.   On: 11/13/2016 16:09   Ct Renal Stone Study  Result Date: 11/13/2016 CLINICAL DATA:  83 y/o F; history of chronic back pain status post fall 2 weeks ago with flank pain and nausea. EXAM: CT ABDOMEN AND PELVIS WITHOUT CONTRAST TECHNIQUE: Multidetector CT imaging of the abdomen and pelvis was performed following the standard protocol without IV contrast. COMPARISON:  None. FINDINGS: Lower chest: Small bilateral pleural effusions. Dependent ground-glass opacities in the lungs probably represents associated atelectasis. Moderate cardiomegaly. Extensive coronary artery calcification. Partially visualized pacemaking wires. Hepatobiliary: No focal liver lesion identified. Cholelithiasis. No evidence for cholecystitis. No intra or extrahepatic biliary ductal dilatation. Pancreas: Unremarkable. No pancreatic ductal dilatation or surrounding inflammatory changes. Spleen: Normal in size without focal abnormality. Adrenals/Urinary Tract: Adrenal glands are unremarkable. Kidneys are normal, without renal calculi, focal lesion, or hydronephrosis. Bladder is unremarkable. Stomach/Bowel: No obstructive or inflammatory changes of the bowel. Normal appendix. Extensive sigmoid diverticulosis without evidence for diverticulitis. Surgical clips at the gastroesophageal junction prior hernia repair. Vascular/Lymphatic: Aortic atherosclerosis. No enlarged abdominal or pelvic  lymph nodes. Reproductive: Pelvis is largely obscured by artifact from hip hardware. Status post hysterectomy. No discrete adnexal lesion identified. Other: Diastases rectus.  No significant herniation. Musculoskeletal: Stable compression deformities of the T11, T12, and L1 vertebral bodies post kyphoplasty, superior endplate deformity of the L3 vertebral body, and severe compression deformity of the L4 vertebral body. Multiple chronic rib fracture deformities are present bilaterally. Left hip hemiarthroplasty noted without apparent hardware related complication. No acute fracture is identified. IMPRESSION: 1. No acute process of the abdomen or pelvis identified as explanation for pain. 2. Small bilateral pleural effusions. 3. Moderate cardiomegaly and severe coronary artery calcification. 4. Cholelithiasis. 5. Extensive sigmoid diverticulosis without evidence for diverticulitis. 6. Aortic atherosclerosis with moderate to severe calcification. 7. Numerous chronic rib fractures. Several stable compression deformities of the thoracic and lumbar spine post kyphoplasty from T11 through L1. No acute fracture identified. Electronically Signed   By: Kristine Garbe M.D.   On: 11/13/2016 22:11    EKG & Cardiac Imaging    EKG:  AV paced rhythm, HR 83.    Echocardiogram: 11/2011    Echocardiogram: 11/2016 Study Conclusions  - Left ventricle: The cavity size was normal. Wall thickness was   increased in a pattern of moderate LVH. Indeterminant diastolic   function (atrial fibrillation). Systolic function was normal. The   estimated ejection fraction was in the range of 55% to 60%. Wall   motion was normal; there were no regional wall motion   abnormalities. - Aortic valve: Trileaflet; mildly calcified leaflets. Lambl&'s   excrescence on aortic valve. There was no stenosis. There was   mild regurgitation. - Aorta: Ascending aortic diameter: 40 mm (S). - Ascending aorta: The ascending aorta was  mildly enlarged. - Mitral valve: Mildly calcified annulus. There was trivial   regurgitation. - Left atrium: The atrium was moderately dilated. - Right ventricle: The cavity size was normal. Pacer wire or   catheter noted in right ventricle. Systolic function was mildly   reduced. - Tricuspid valve: Peak RV-RA gradient (S): 39 mm Hg. - Pulmonary arteries: PA peak pressure: 54 mm Hg (S). - Systemic veins: IVC measured 2.3 cm with < 50% respirophasic   variation suggesting RA pressure 15 mmHg.  Impressions:  - The patient was in atrial fibrillation. Normal  LV size with   moderate LV hypertrophy. EF 55-60%. Mild aortic insufficiency.   Moderate LAE. Moderate pulmonary hypertension. Normal RV size   with mildly decreased systolic function.   Assessment & Plan    1. Acute on Chronic Diastolic CHF - EF 123456 by echo in 2013. Repeat echocardiogram has been obtained which shows a preserved EF of 55-60% with no regional WMA. PA peak pressure elevated to 54 mm Hg.  - presented with lower extremity edema and dyspnea with exertion after not being on Lasix - BNP at 412 on admission and CXR showed mild interstitial edema suggesting mild volume overload/CHF.  - started on IV Lasix 40mg  daily with a net output of -550 mL thus far, however weight is down from 134 lbs to 129 lbs.  Was 132 lbs at the time of her last office visit in 01/2016.  - Her LE edema has resolved and her lungs are clear.  She admits to eating frozen dinners some.  She clinically appears euvolemic.  Would change to Lasix 40mg PO daily and follow her renal function closely.  - TSH is normal.   2. PAF - This patients CHA2DS2-VASc Score and unadjusted Ischemic Stroke Rate (% per year) is equal to 7.2 % stroke rate/year from a score of 5 (HTN, Female, DM, Age (2)). Not on anticoagulation secondary to history of GI bleeds. - continue Amio for rate control.    3. HTN - BP at 125/60 - 148/84 in the past 24 hours. - continue current  medication regimen with ARB/BB /CCB  4. HLD - continue statin therapy.   5. Stage 2 CKD - creatinine remains stable at 0.65.

## 2016-11-16 NOTE — Progress Notes (Signed)
PROGRESS NOTE    [name]  [id number] DOB: [birth year] DOA: 11/13/2016 PCP: Mayra Neer, MD    Brief Narrative:  83 y.o. female with a past medical history significant for Afib not on warfarin 2/2 diverticular hemorrhage, HFpEF, sick sinus with pacer, HTN, and hypothyroidism who presents with leg swelling, dyspnea for 2 weeks and new low back pain.  With regard to dyspnea, the patient's daughter describes that she has been noticing a few weeks of increased leg swelling which she was initially trying to treat with TED hose. Then last week, daughter was unable to care for her mother for a few days due to flu, and afterwards she noticed her leg swelling was worse, and she now had shortness of breath with exertion (she gets out regularly to church only, and is otherwise limited to walking short distances only).  Furthermore, then the daughter discovered that her mother wasn't taking any of her medicines, possibly for weeks or more, so she brought her to the ER.  In the meantime, the patient was complaining over the last 2-3 days of increased back pain.  This is in the mid back, is worse with movement or being repositioned.  Is not associated with fever, leg weakness, bowel or blader incontinence  Patient was admitted for acute on chronic CHF.  Assessment & Plan:   Principal Problem:   Acute on chronic diastolic CHF (congestive heart failure) (HCC) Active Problems:   Chronic atrial fibrillation (HCC)   Hyponatremia   Hypothyroidism, acquired   Sick sinus syndrome ([place])   Pacemaker   Essential hypertension   Back pain  1. Acute on chronic diastolic CHF:  -Patient continued on Furosemide 40 mg IV daily -will continue with strict I/Os, daily weights -Most recent Wt of 58.9 from 60.8kg on admit -Continued on ARB and BB -2d echo reviewed. Normal LVEF with indeterminant diastolic function secondary to afib -TSH reviewed. Within normal limits - LE edema improving nicely - renal  function stable - Appreciate input by Cardiology  2. Back pain:  -Unclear etiology.  No new compression fractures.  CT imaging is unrevealing.  -PT eval requested -Robaxin and analgesics continued for now  3. Chronic atrial fibrillation:  CHADS2Vasc of at least5.  Holding coumadin secondary to history of diverticular bleeding. -Continue amiodarone -Continue diltiazem and BB - remains rate controlled  4. Hyponatremia:  Mild.  Hypervolemic.   -Remains stable -Repeat bmet in am  5. HTN:  -Continue ARB, BB, statin --BP remains stable. - Continue current regimen  6. Hypothyroidism:  -Continue levothyroxine as tolerated - normal TSH - Stable  7. Other medications:  -Continue gabapentin continued  8. Constipation - family reports lack of significant bowel movements over the past several days -have ordered cathartics   9. Sinus congestion - Flonase already ordered - Will order nasal saline  10. End of Life - Multiple family in room - Everyone in room, including patient, agrees that patient's known wishes are to be DNR/DNI.  - Orders placed  DVT prophylaxis: Lovenox subQ Code Status: Full Family Communication: Pt in room, family not at bedside Disposition Plan: SNF, timing uncertain  Consultants:   Cardiology  Procedures:     Antimicrobials: Anti-infectives    None      Subjective: Pleasant with no complaints  Objective: Vitals:   11/15/16 1637 11/15/16 2115 11/16/16 0529 11/16/16 1333  BP: 125/70 140/84 (!) 148/60 (!) 128/55  Pulse: 81 96 96 (!) 101  Resp: 18 18 17 16   Temp: 97.9 F (  36.6 C) 98.9 F (37.2 C) 97.8 F (36.6 C) 98.4 F (36.9 C)  TempSrc: Oral Oral Oral Oral  SpO2: 95% 95% 96% 94%  Weight:      Height:        Intake/Output Summary (Last 24 hours) at 11/16/16 1811 Last data filed at 11/16/16 1205  Gross per 24 hour  Intake                0 ml  Output              850 ml  Net             -850 ml   Filed Weights    11/14/16 0609 11/15/16 0615  Weight: 60.8 kg (134 lb 0.6 oz) 58.9 kg (129 lb 13.6 oz)    Examination:  General exam: Awake, conversant, in nad  Respiratory system: normal chest rise, no audible wheezing Cardiovascular system: regular rhythm, s1, s2 on auscultation Gastrointestinal system: pos bs, soft, nontender Central nervous system: no seizures, no tremors Extremities: no cyanosis, no joint deformities, LE edema much improved Skin: no rashes, no pallor Psychiatry: affect normal// no auditory hallucinations.   Data Reviewed: I have personally reviewed following labs and imaging studies  CBC:  Recent Labs Lab 11/13/16 1553  WBC 6.9  HGB 10.7*  HCT 32.7*  MCV 91.1  PLT 99991111   Basic Metabolic Panel:  Recent Labs Lab 11/13/16 1553 11/15/16 0522 11/16/16 0545  NA 131* 132* 132*  K 3.6 3.3* 3.9  CL 99* 90* 91*  CO2 24 31 31   GLUCOSE 117* 87 111*  BUN 8 10 9   CREATININE 0.62 0.64 0.65  CALCIUM 9.2 8.7* 8.6*   GFR: Estimated Creatinine Clearance: 39.1 mL/min (by C-G formula based on SCr of 0.65 mg/dL). Liver Function Tests: No results for input(s): AST, ALT, ALKPHOS, BILITOT, PROT, ALBUMIN in the last 168 hours. No results for input(s): LIPASE, AMYLASE in the last 168 hours. No results for input(s): AMMONIA in the last 168 hours. Coagulation Profile: No results for input(s): INR, PROTIME in the last 168 hours. Cardiac Enzymes:  Recent Labs Lab 11/13/16 2258  TROPONINI <0.03   BNP (last 3 results) No results for input(s): PROBNP in the last 8760 hours. HbA1C: No results for input(s): HGBA1C in the last 72 hours. CBG: No results for input(s): GLUCAP in the last 168 hours. Lipid Profile: No results for input(s): CHOL, HDL, LDLCALC, TRIG, CHOLHDL, LDLDIRECT in the last 72 hours. Thyroid Function Tests:  Recent Labs  11/14/16 0557  TSH 3.006   Anemia Panel: No results for input(s): VITAMINB12, FOLATE, FERRITIN, TIBC, IRON, RETICCTPCT in the last 72  hours. Sepsis Labs: No results for input(s): PROCALCITON, LATICACIDVEN in the last 168 hours.  No results found for this or any previous visit (from the past 240 hour(s)).   Radiology Studies: No results found.  Scheduled Meds: . amiodarone  100 mg Oral Daily  . diltiazem  120 mg Oral Daily  . enoxaparin (LOVENOX) injection  40 mg Subcutaneous Q24H  . fluticasone  1 spray Each Nare Daily  . furosemide  40 mg Intravenous Daily  . gabapentin  600 mg Oral QHS  . levothyroxine  50 mcg Oral QAC breakfast  . losartan  100 mg Oral q morning - 10a  . metoprolol  200 mg Oral q morning - 10a  . polyethylene glycol  17 g Oral Daily  . potassium chloride  40 mEq Oral Daily  . pravastatin  40 mg  Oral Daily   Continuous Infusions:   LOS: 2 days   Analyssa Downs, Orpah Melter, MD [facility] Pager [phone]  If 7PM-7AM, please contact night-coverage [email or web address] Password [email or web address] 11/16/2016, 6:11 PM

## 2016-11-16 NOTE — Progress Notes (Signed)
Follow up with family regarding advance directive.  Reported to granddaughter that this chaplain did not feel patient has capacity to sign document.   Granddaughter agrees that patient lacks capacity for Adv. Dir. .     Family understands pt does not want intubation nor chest compressions.  Granddaughter reports pt as told them this.  Is hopeful to find a way to document this so that she would not receive this treatment at an outside facility.   Chaplain spoke with granddaughter about default HCPOA being next of kin.  Pt has son and daughter and adopted daughter.   Family plan to speak with physician about possibility / scope of DNR or Most form.     This chaplain reported same to pt's RN.      [place], [place]

## 2016-11-17 DIAGNOSIS — E039 Hypothyroidism, unspecified: Secondary | ICD-10-CM

## 2016-11-17 LAB — BASIC METABOLIC PANEL
ANION GAP: 9 (ref 5–15)
BUN: 16 mg/dL (ref 6–20)
CHLORIDE: 95 mmol/L — AB (ref 101–111)
CO2: 29 mmol/L (ref 22–32)
Calcium: 8.6 mg/dL — ABNORMAL LOW (ref 8.9–10.3)
Creatinine, Ser: 0.68 mg/dL (ref 0.44–1.00)
GFR calc Af Amer: >60 mL/min (ref >60)
GLUCOSE: 111 mg/dL — AB (ref 65–99)
POTASSIUM: 4.3 mmol/L (ref 3.5–5.1)
Sodium: 133 mmol/L — ABNORMAL LOW (ref 135–145)

## 2016-11-17 MED ORDER — METOPROLOL SUCCINATE ER 100 MG PO TB24: 200.0000 mg | ORAL_TABLET | Freq: Every morning | ORAL | Status: DC

## 2016-11-17 NOTE — Clinical Social Work Placement (Signed)
   CLINICAL SOCIAL WORK PLACEMENT  NOTE  Date:  11/17/2016  Patient Details  Name: [name] MRN: [id number] Date of Birth: [birth year]  Clinical Social Work is seeking post-discharge placement for this patient at the [place] level of care (*CSW will initial, date and re-position this form in  chart as items are completed):  Yes   Patient/family provided with [facility] Work Department's list of facilities offering this level of care within the geographic area requested by the patient (or if unable, by the patient's family).  Yes   Patient/family informed of their freedom to choose among providers that offer the needed level of care, that participate in Medicare, Medicaid or managed care program needed by the patient, have an available bed and are willing to accept the patient.  Yes   Patient/family informed of [facility]'s ownership interest in [facility] and [facility], as well as of the fact that they are under no obligation to receive care at these facilities.  PASRR submitted to EDS on       PASRR number received on       Existing PASRR number confirmed on 11/15/16     FL2 transmitted to all facilities in geographic area requested by pt/family on 11/15/16     FL2 transmitted to all facilities within larger geographic area on       Patient informed that his/her managed care company has contracts with or will negotiate with certain facilities, including the following:        Yes   Patient/family informed of bed offers received.  Patient chooses bed at [facility], [place]     Physician recommends and patient chooses bed at      Patient to be transferred to [facility], [place] on 11/17/16.  Patient to be transferred to facility by Ambulance [facility])     Patient family notified on 11/17/16 of transfer.  Name of family member notified:  Daughters: Vaughan Basta and April     PHYSICIAN Please sign DNR, Please prepare  prescriptions, Please prepare priority discharge summary, including medications     Additional Comment: Nurse provided report number to call facility; DC summary sent to SNF and notified Heather and April at facility of d/c.  Patient and family are agreeable to d/c.  No further SW intervention is indicated.  Patient and daughters are pleased with d/c plan completion.  SW signing off.     _______________________________________________ Williemae Area, LCSW 11/17/2016, 1:44 PM

## 2016-11-17 NOTE — Discharge Summary (Signed)
Physician Discharge Summary  [name] [id number] DOB: 1926/05/04 DOA: 11/13/2016  PCP: Mayra Neer, MD  Admit date: 11/13/2016 Discharge date: 11/17/2016  Admitted From: Home Disposition:  SNF  Recommendations for Outpatient Follow-up:  1. Follow up with PCP in 2-3 weeks 2. Recommend repeat basic metabolic panel in 1 week   Discharge Condition:Improved CODE STATUS:DNR/DNI Diet recommendation: Heart healthy   Brief/Interim Summary: 83 y.o.femalewith a past medical history significant for Afib not on warfarin 2/2 diverticular hemorrhage, HFpEF, sick sinus with pacer, HTN, and hypothyroidismwho presents with leg swelling, dyspnea for 2 weeks and new low back pain.  With regard to dyspnea, the patient's daughter describes that she has been noticing a few weeks of increased leg swelling which she was initially trying to treat with TED hose. Then last week,daughter was unable to care for her mother for a few days due to flu, and afterwards she noticed her leg swelling was worse, and she now had shortness of breath with exertion (she gets out regularly to church only, and is otherwise limited to walking short distances only). Furthermore, then the daughter discovered that her mother wasn't taking any of her medicines, possibly for weeks or more, so she brought her to the ER.  In the meantime, the patient was complaining over the last 2-3 days of increased back pain. This is in the mid back, is worse with movement or being repositioned. Is not associated with fever, leg weakness, bowel or blader incontinence  Patient was admitted for acute on chronic CHF.  1. Acute on chronic diastolic CHF: -Patient was initially continued on Furosemide 40 mg IV daily -Patient was continued on ARB and beta blocker -2d echo reviewed. Normal LVEF with indeterminant diastolic function secondary to afib -TSH reviewed. Within normal limits - LE edema improving nicely - renal function stable -  Appreciate input by Cardiology. Patient transitioned to PO lasix on 1/6. Discussed case with Cardiology on day of discharge and patient is cleared from Cardiology standpoint. - Recommend follow up basic metabolic panel in 1 week, focus on electrolytes and renal function  2. Back pain: -Unclear etiology. No new compression fractures. CT imaging is unrevealing.  -PT eval requested with recommendations for SNF -Robaxin and analgesics continued while inpatient  3. Chronic atrial fibrillation: CHADS2Vasc of at least5. Holding coumadin secondary to history of diverticular bleeding. -Continue amiodarone -Continue diltiazem and BB - remains rate controlled  4. Hyponatremia: Mild. Hypervolemic.  -Remains stable  5. HTN: -Continue ARB, BB, statin --BP remains stable. - Continue current regimen  6. Hypothyroidism: -Continue levothyroxine as tolerated - normal TSH - Stable  7. Other medications: -Continue gabapentin continued  8. Constipation - family reports lack of significant bowel movements over the past several days -have ordered cathartics with good results  9. Sinus congestion - Flonase already ordered - Added nasal saline  10. End of Life - Multiple family in room - Everyone in room, including patient, agrees that patient's known wishes are to be DNR/DNI.  - DNR orders were placed  Discharge Diagnoses:  Principal Problem:   Acute on chronic diastolic CHF (congestive heart failure) ([place]) Active Problems:   Chronic atrial fibrillation ([facility])   Hyponatremia   Hypothyroidism, acquired   Sick sinus syndrome ([place])   Pacemaker   Essential hypertension   Back pain   Acute low back pain without sciatica   Acute on chronic congestive heart failure ([place])   DNR (do not resuscitate) discussion   Discharge Instructions  Discharge Instructions  Face-to-face encounter (required for Medicare/Medicaid patients)    Complete by:  As directed    I  CAMPOS,KEVIN M certify that this patient is under my care and that I, or a nurse practitioner or physician's assistant working with me, had a face-to-face encounter that meets the physician face-to-face encounter requirements with this patient on 11/13/2016. The encounter with the patient was in whole, or in part for the following medical condition(s) which is the primary reason for home health care (List medical condition): low back pain, worsening lower extremity edema   The encounter with the patient was in whole, or in part, for the following medical condition, which is the primary reason for home health care:  low back pain, worsening as well as lower extremity edema   I certify that, based on my findings, the following services are medically necessary home health services:  Nursing   Reason for Medically Necessary Home Health Services:  Skilled Nursing- Change/Decline in Patient Status   My clinical findings support the need for the above services:  Pain interferes with ambulation/mobility   Further, I certify that my clinical findings support that this patient is homebound due to:  Pain interferes with ambulation/mobility   Home Health    Complete by:  As directed    To provide the following care/treatments:   PT RN Home Health Aide       Allergies as of 11/17/2016      Reactions   Ace Inhibitors Cough   Aspirin Nausea Only   Sulfa Drugs Cross Reactors Nausea Only   Atorvastatin Other (See Comments)   Leg swelling   Welchol [colesevelam Hcl] Hives      Medication List    TAKE these medications   amiodarone 200 MG tablet Commonly known as:  PACERONE Take 0.5 tablets (100 mg total) by mouth daily.   CALCIUM 600 + D PO Take 1 tablet by mouth daily.   cetirizine 10 MG tablet Commonly known as:  ZYRTEC Take 10 mg by mouth at bedtime.   cholecalciferol 1000 units tablet Commonly known as:  VITAMIN D Take 1,000 Units by mouth daily.   cyclobenzaprine 10 MG tablet Commonly known  as:  FLEXERIL Take 1 tablet (10 mg total) by mouth 3 (three) times daily as needed for muscle spasms.   diltiazem 120 MG 24 hr capsule Commonly known as:  CARDIZEM CD Take 1 capsule (120 mg total) by mouth daily.   furosemide 40 MG tablet Commonly known as:  LASIX Take 40 mg by mouth daily.   gabapentin 300 MG capsule Commonly known as:  NEURONTIN Take 2 capsules by mouth at bedtime.   HYDROcodone-acetaminophen 10-325 MG tablet Commonly known as:  NORCO Take 1 tablet by mouth every 8 (eight) hours as needed for moderate pain. Back pain   levothyroxine 50 MCG tablet Commonly known as:  SYNTHROID, LEVOTHROID Take 50 mcg by mouth daily before breakfast.   losartan 100 MG tablet Commonly known as:  COZAAR Take 100 mg by mouth every morning.   metoprolol 200 MG 24 hr tablet Commonly known as:  TOPROL-XL Take 200 mg by mouth every morning.   multivitamin with minerals Tabs tablet Take 1 tablet by mouth daily.   pravastatin 40 MG tablet Commonly known as:  PRAVACHOL Take 40 mg by mouth daily.       Contact information for follow-up providers    SHAW,KIMBERLEE, MD Follow up.   Specialty:  Family Medicine Why:  Please call your doctor for follow up  in 2 days if not improved Contact information: [address] [place] [address] [place] DEPT Follow up.   Specialty:  Emergency Medicine Why:  Return to ER for any new or worsening symptoms Contact information: [place] [id number] [place] [phone]           Contact information for after-discharge care    Destination    [facility] Follow up.   Specialty:  Skilled Nursing Facility Contact information: [place] [phone]                 Allergies  Allergen Reactions  . Ace Inhibitors Cough  . Aspirin Nausea Only   . Sulfa Drugs Cross Reactors Nausea Only  . Atorvastatin Other (See Comments)    Leg swelling  . Welchol [Colesevelam Hcl] Hives    Consultations:  Cardiology  Procedures/Studies: Dg Chest 2 View  Result Date: 11/13/2016 CLINICAL DATA:  Fall 2 weeks ago, mid lower back pain for 2 days. Atrial fibrillation, diabetes, GERD, hypertension, former smoker. EXAM: CHEST  2 VIEW COMPARISON:  Chest x-rays dated 06/04/2014 and 08/21/2013. FINDINGS: There is mild cardiomegaly, stable. Atherosclerotic changes noted at the aortic arch. Left chest wall pacemaker in place with grossly stable positioning of the leads. Subtle opacities within each lung, most likely mild edema. No confluent opacity to suggest developing pneumonia. Probable small left pleural effusion. No pneumothorax seen. There are healing fractures of the left posterior fifth through eighth ribs. Osteopenia limits characterization of the thoracic spine but no obvious evidence of acute fracture or dislocation is seen in the thoracic spine. Patient is status post vertebroplasties at the thoracolumbar junction. IMPRESSION: 1. Healing fractures of the posterior left fifth through eighth ribs, likely subacute in age. 2. Cardiomegaly with mild interstitial edema suggesting mild volume overload/CHF. No evidence of pneumonia. 3. Probable small left pleural effusion. 4. Aortic atherosclerosis. Electronically Signed   By: Franki Cabot M.D.   On: 11/13/2016 16:04   Dg Thoracic Spine 2 View  Result Date: 11/13/2016 CLINICAL DATA:  Mid lower back pain x 2 days. Per notes-pt fell 2 weeks ago A-fib, DM, GERD, HTN, osteoarthritis, former smoker x 38 years ago Sx: kyphosis-2010/2012, pacemaker-03/29/2008 EXAM: THORACIC SPINE 2 VIEWS COMPARISON:  CT thoracic spine dated 04/18/2011. Chest x-ray dated 11/13/2016. FINDINGS: Status post vertebroplasties at the T11 through L1 levels, without significant change compared to recent chest x-rays. Osteopenia limits  characterization of the majority of the thoracic spine but there is no acute fracture or displacement seen. Immediate paravertebral soft tissues are unremarkable. IMPRESSION: Osteopenia.  No acute findings. Electronically Signed   By: Franki Cabot M.D.   On: 11/13/2016 16:12   Dg Lumbar Spine Complete  Result Date: 11/13/2016 CLINICAL DATA:  Mid lower back pain x 2 days. Per notes-pt fell 2 weeks ago A-fib, DM, GERD, HTN, osteoarthritis, former smoker x 38 years ago Sx: kyphosis-2010/2012, pacemaker-03/29/2008 EXAM: LUMBAR SPINE - COMPLETE 4+ VIEW COMPARISON:  CT abdomen dated 06/30/2015 FINDINGS: There is stable scoliosis of the thoracolumbar spine. Again noted are changes of vertebroplasties at the thoracolumbar junction. There is a stable severe compression fracture deformity at the L4 vertebral body. There is an additional compression fracture of the L3 vertebral body, moderate in degree, also chronic in appearance, perhaps slightly worsened compared to the earlier CT. No acute appearing osseous abnormality identified. Atherosclerotic changes noted  along the walls of the infrarenal abdominal aorta. Visualized paravertebral soft tissues are otherwise unremarkable. IMPRESSION: 1. No acute findings. Stable severe compression fracture deformity of the L4 vertebral body. Additional compression fracture deformity of the L3 vertebral body, moderate in degree, also chronic in appearance although perhaps slightly worsened in degree compared to the earlier CT of 06/30/2015. 2. No acute findings seen. 3. Aortic atherosclerosis. Electronically Signed   By: Franki Cabot M.D.   On: 11/13/2016 16:09   Ct Renal Stone Study  Result Date: 11/13/2016 CLINICAL DATA:  83 y/o F; history of chronic back pain status post fall 2 weeks ago with flank pain and nausea. EXAM: CT ABDOMEN AND PELVIS WITHOUT CONTRAST TECHNIQUE: Multidetector CT imaging of the abdomen and pelvis was performed following the standard protocol without IV  contrast. COMPARISON:  None. FINDINGS: Lower chest: Small bilateral pleural effusions. Dependent ground-glass opacities in the lungs probably represents associated atelectasis. Moderate cardiomegaly. Extensive coronary artery calcification. Partially visualized pacemaking wires. Hepatobiliary: No focal liver lesion identified. Cholelithiasis. No evidence for cholecystitis. No intra or extrahepatic biliary ductal dilatation. Pancreas: Unremarkable. No pancreatic ductal dilatation or surrounding inflammatory changes. Spleen: Normal in size without focal abnormality. Adrenals/Urinary Tract: Adrenal glands are unremarkable. Kidneys are normal, without renal calculi, focal lesion, or hydronephrosis. Bladder is unremarkable. Stomach/Bowel: No obstructive or inflammatory changes of the bowel. Normal appendix. Extensive sigmoid diverticulosis without evidence for diverticulitis. Surgical clips at the gastroesophageal junction prior hernia repair. Vascular/Lymphatic: Aortic atherosclerosis. No enlarged abdominal or pelvic lymph nodes. Reproductive: Pelvis is largely obscured by artifact from hip hardware. Status post hysterectomy. No discrete adnexal lesion identified. Other: Diastases rectus.  No significant herniation. Musculoskeletal: Stable compression deformities of the T11, T12, and L1 vertebral bodies post kyphoplasty, superior endplate deformity of the L3 vertebral body, and severe compression deformity of the L4 vertebral body. Multiple chronic rib fracture deformities are present bilaterally. Left hip hemiarthroplasty noted without apparent hardware related complication. No acute fracture is identified. IMPRESSION: 1. No acute process of the abdomen or pelvis identified as explanation for pain. 2. Small bilateral pleural effusions. 3. Moderate cardiomegaly and severe coronary artery calcification. 4. Cholelithiasis. 5. Extensive sigmoid diverticulosis without evidence for diverticulitis. 6. Aortic atherosclerosis  with moderate to severe calcification. 7. Numerous chronic rib fractures. Several stable compression deformities of the thoracic and lumbar spine post kyphoplasty from T11 through L1. No acute fracture identified. Electronically Signed   By: Kristine Garbe M.D.   On: 11/13/2016 22:11    Subjective: No complaints  Discharge Exam: Vitals:   11/17/16 0901 11/17/16 1124  BP: (!) 100/50 114/68  Pulse:  94  Resp:    Temp:     Vitals:   11/16/16 2058 11/17/16 0529 11/17/16 0901 11/17/16 1124  BP: 131/80 (!) 114/52 (!) 100/50 114/68  Pulse: 83 63  94  Resp: 18 17    Temp: 98 F (36.7 C) 97.6 F (36.4 C)    TempSrc: Oral Oral    SpO2: 94% 98%    Weight:  61 kg (134 lb 7.7 oz)    Height:        General: Pt is alert, awake, not in acute distress Cardiovascular: RRR, S1/S2 +, no rubs, no gallops Respiratory: CTA bilaterally, no wheezing, no rhonchi Abdominal: Soft, NT, ND, bowel sounds + Extremities: no edema, no cyanosis   The results of significant diagnostics from this hospitalization (including imaging, microbiology, ancillary and laboratory) are listed below for reference.     Microbiology: No results found for  this or any previous visit (from the past 240 hour(s)).   Labs: BNP (last 3 results)  Recent Labs  11/13/16 2258  BNP [id number]*   Basic Metabolic Panel:  Recent Labs Lab 11/13/16 1553 11/15/16 0522 11/16/16 0545 11/17/16 0546  NA 131* 132* 132* 133*  K 3.6 3.3* 3.9 4.3  CL 99* 90* 91* 95*  CO2 24 31 31 29   GLUCOSE 117* 87 111* 111*  BUN 8 10 9 16   CREATININE 0.62 0.64 0.65 0.68  CALCIUM 9.2 8.7* 8.6* 8.6*   Liver Function Tests: No results for input(s): AST, ALT, ALKPHOS, BILITOT, PROT, ALBUMIN in the last 168 hours. No results for input(s): LIPASE, AMYLASE in the last 168 hours. No results for input(s): AMMONIA in the last 168 hours. CBC:  Recent Labs Lab 11/13/16 1553  WBC 6.9  HGB 10.7*  HCT 32.7*  MCV 91.1  PLT 263   Cardiac  Enzymes:  Recent Labs Lab 11/13/16 2258  TROPONINI <0.03   BNP: Invalid input(s): POCBNP CBG: No results for input(s): GLUCAP in the last 168 hours. D-Dimer No results for input(s): DDIMER in the last 72 hours. Hgb A1c No results for input(s): HGBA1C in the last 72 hours. Lipid Profile No results for input(s): CHOL, HDL, LDLCALC, TRIG, CHOLHDL, LDLDIRECT in the last 72 hours. Thyroid function studies No results for input(s): TSH, T4TOTAL, T3FREE, THYROIDAB in the last 72 hours.  Invalid input(s): FREET3 Anemia work up No results for input(s): VITAMINB12, FOLATE, FERRITIN, TIBC, IRON, RETICCTPCT in the last 72 hours. Urinalysis    Component Value Date/Time   COLORURINE YELLOW 11/13/2016 1903   APPEARANCEUR CLEAR 11/13/2016 1903   LABSPEC 1.012 11/13/2016 1903   PHURINE 7.0 11/13/2016 1903   GLUCOSEU NEGATIVE 11/13/2016 1903   HGBUR SMALL (A) 11/13/2016 1903   BILIRUBINUR NEGATIVE 11/13/2016 1903   KETONESUR 20 (A) 11/13/2016 1903   PROTEINUR 30 (A) 11/13/2016 1903   UROBILINOGEN 0.2 08/21/2013 1615   NITRITE NEGATIVE 11/13/2016 1903   LEUKOCYTESUR NEGATIVE 11/13/2016 1903   Sepsis Labs Invalid input(s): PROCALCITONIN,  WBC,  LACTICIDVEN Microbiology No results found for this or any previous visit (from the past 240 hour(s)).   SIGNED:   Donne Hazel, MD  [facility] 11/17/2016, 1:02 PM  If 7PM-7AM, please contact night-coverage [email or web address] Password [email or web address]

## 2016-11-17 NOTE — Progress Notes (Signed)
Patient discharged to [facility] facility via ambulance, report called and given to [facility] supervisor at [facility].. Patient denies any distress, family at bedside during transport. Skin intact, no wound noted.

## 2016-12-13 ENCOUNTER — Encounter: Payer: Self-pay | Admitting: Physician Assistant

## 2016-12-13 ENCOUNTER — Ambulatory Visit (INDEPENDENT_AMBULATORY_CARE_PROVIDER_SITE_OTHER): Payer: Medicare Other | Admitting: Physician Assistant

## 2016-12-13 ENCOUNTER — Encounter (INDEPENDENT_AMBULATORY_CARE_PROVIDER_SITE_OTHER): Payer: Self-pay

## 2016-12-13 VITALS — BP 122/68 | HR 85 | Ht 60.0 in | Wt 122.2 lb

## 2016-12-13 DIAGNOSIS — I5032 Chronic diastolic (congestive) heart failure: Secondary | ICD-10-CM

## 2016-12-13 DIAGNOSIS — I10 Essential (primary) hypertension: Secondary | ICD-10-CM

## 2016-12-13 DIAGNOSIS — I482 Chronic atrial fibrillation, unspecified: Secondary | ICD-10-CM

## 2016-12-13 MED ORDER — FUROSEMIDE 40 MG PO TABS: ORAL_TABLET | ORAL | 11 refills | Status: AC

## 2016-12-13 NOTE — Patient Instructions (Signed)
Medication Instructions: Your physician has recommended you make the following change in your medication:  1.  DECREASE the Lasix to 40 mg taking 1 tablet qod and 1/2 tablet on alternating days   Labwork: TODAY:  BMET  Testing/Procedures: None ordered  Follow-Up: Your physician recommends that you schedule a follow-up appointment in: [place] DR. VARANASI   Any Other Special Instructions Will Be Listed Below (If Applicable).  TAKE YOUR MEDICATIONS WITH FOOD   If you need a refill on your cardiac medications before your next appointment, please call your pharmacy.

## 2016-12-13 NOTE — Progress Notes (Signed)
Cardiology Office Note    Date:  12/13/2016   ID:  ZHURI FIXICO, DOB July 12, 1926, MRN [id number]  PCP:  Mayra Neer, MD  Cardiologist: Dr. Irish Lack EPS: Dr. Rayann Heman  Chief Complaint  Patient presents with  . Follow-up    CHF    History of Present Illness:  [name] is a 83 y.o. female  with past medical history of PAF (not on anticoagulation secondary to GI bleeds), SSS (s/p PPM 2009), chronic diastolic CHF (EF [id number] by echo in 2013), HTN, HLD, and Stage 2 CKD who presented to [facility] ED on 11/14/2016 for worsening lower back pain following a fall two weeks prior.    Family reported she had been noncompliant with her medications, including her Lasix 40mg  daily, and had noted worsening lower extremity edema and dyspnea with exertion. Family usually assist with medication management but had been absent for several days due to illness and this is when she was mostly noncompliant. According to one daughter she had not been on Lasix in over a year and her PCP had taken her off of it, she thinks, because her kidneys were working right. Chest x-ray showed mild interstitial edema suggesting mild volume overload/CHF. BNP was 412.  She was started on IV Lasix daily 40 mg with good output. Repeat echo showed LVEF 55-60% with no regional wall motion abnormality. PA peak pressure elevated at 54 mmHg. Weight went from 132 pounds to 129 pounds.  Patient is brought in today by her granddaughter who is a Marine scientist. Patient was discharged to [facility] and labs drawn on 11/27/15 showed BUN to be 23 creatinine 0.75. BNP was 235. Lasix was changed to 60 mg Monday Wednesday Friday. Her granddaughter also thought she had a stroke but staff attributed it to the [place] that she takes.  Patient's granddaughter thinks her ankles are swollen since she's been discharged from the nursing facility. Her weight is actually down to 122 pounds. Her breathing is fine. She is weak. She is trying to follow low  sodium diet. She lives with her son. Main complaint is weakness, constipation, she has a bedsore on her back that is bandaged and home health was supposed to be arranged but they have not been coming to see her. Patient also complains of nausea after she takes her medications but she has been taking them on an empty stomach.   Past Medical History:  Diagnosis Date  . Afib ([place])    No Coumadin secondary to history of GI bleed.Marland Kitchen ECHO 12/04/11 LVEF estimated by 2D at 60-65%.  . Allergy   . CKD (chronic kidney disease) stage 2, GFR 60-89 ml/min   . Diabetes mellitus with renal manifestation ([place])   . Diastolic dysfunction   . Diverticulosis of colon with hemorrhage   . Esophageal reflux   . Fracture of T11 vertebra ([place]) 2010   Compression fracture  . GERD (gastroesophageal reflux disease)    s/p Nissen Repair, Dr. Hassell Done  . History of GI diverticular bleed 11/2011   ASA stopped  . Hyperlipidemia   . Hypertension   . Hypothyroidism   . Iron deficiency anemia secondary to blood loss (chronic)   . Mixed hyperlipidemia   . Osteoarthritis   . Sick sinus syndrome (HCC)    s/p PPM  . Skin cancer of nose    Removed by derm  . Unspecified vitamin D deficiency     Past Surgical History:  Procedure Laterality Date  . BALLOON DILATION N/A 06/29/2014  Procedure: BALLOON DILATION;  Surgeon: Garlan Fair, MD;  Location: [facility];  Service: Endoscopy;  Laterality: N/A;  . CYSTOCELE REPAIR     x 2  . ESOPHAGOGASTRODUODENOSCOPY (EGD) WITH PROPOFOL N/A 06/29/2014   Procedure: ESOPHAGOGASTRODUODENOSCOPY (EGD) WITH PROPOFOL;  Surgeon: Garlan Fair, MD;  Location: [facility];  Service: Endoscopy;  Laterality: N/A;  . EYE SURGERY     bilateral cataracts with lens implants  . HIATAL HERNIA REPAIR    . HIP ARTHROPLASTY Left 08/22/2013   Procedure: ARTHROPLASTY BIPOLAR HIP;  Surgeon: Mauri Pole, MD;  Location: [facility];  Service: Orthopedics;  Laterality: Left;  . JOINT REPLACEMENT      left hip  . KYPHOSIS SURGERY  2010 & 2012   x3  . Laparoscopic reduction of intrathoracic stomach and repair of hiatal hernia    . NISSEN FUNDOPLICATION    . PACEMAKER INSERTION  03/29/08   For SSS. SJM Zephyr XL DR Implanted by Dr. Leonia Reeves.  . TONSILLECTOMY    . VAGINAL HYSTERECTOMY     partial    Current Medications: Outpatient Medications Prior to Visit  Medication Sig Dispense Refill  . amiodarone (PACERONE) 200 MG tablet Take 0.5 tablets (100 mg total) by mouth daily. 45 tablet 1  . Calcium Carbonate-Vitamin D (CALCIUM 600 + D PO) Take 1 tablet by mouth daily.    . cetirizine (ZYRTEC) 10 MG tablet Take 10 mg by mouth at bedtime.    . cholecalciferol (VITAMIN D) 1000 UNITS tablet Take 1,000 Units by mouth daily.    Marland Kitchen diltiazem (CARDIZEM CD) 120 MG 24 hr capsule Take 1 capsule (120 mg total) by mouth daily. 90 capsule 2  . gabapentin (NEURONTIN) 300 MG capsule Take 2 capsules by mouth at bedtime.   1  . HYDROcodone-acetaminophen (NORCO) 10-325 MG per tablet Take 1 tablet by mouth every 8 (eight) hours as needed for moderate pain. Back pain  0  . levothyroxine (SYNTHROID, LEVOTHROID) 50 MCG tablet Take 50 mcg by mouth daily before breakfast.    . losartan (COZAAR) 100 MG tablet Take 100 mg by mouth every morning.    . metoprolol (TOPROL-XL) 200 MG 24 hr tablet Take 200 mg by mouth every morning.     . Multiple Vitamin (MULTIVITAMIN WITH MINERALS) TABS tablet Take 1 tablet by mouth daily.    . pravastatin (PRAVACHOL) 40 MG tablet Take 40 mg by mouth daily.    . cyclobenzaprine (FLEXERIL) 10 MG tablet Take 1 tablet (10 mg total) by mouth 3 (three) times daily as needed for muscle spasms. 12 tablet 0  . furosemide (LASIX) 40 MG tablet Take 40 mg by mouth daily.      No facility-administered medications prior to visit.      Allergies:   Ace inhibitors; Aspirin; Sulfa drugs cross reactors; Atorvastatin; and Welchol [colesevelam hcl]   Social History   Social History  . Marital  status: Widowed    Spouse name: N/A  . Number of children: N/A  . Years of education: N/A   Social History Main Topics  . Smoking status: Former Smoker    Types: Cigarettes    Quit date: 12/09/1978  . Smokeless tobacco: Never Used  . Alcohol use No  . Drug use: No  . Sexual activity: No   Other Topics Concern  . None   Social History Narrative  . None     Family History:  The patient's family history includes CAD in her father and sister; Colon cancer in  her mother.   ROS:   Please see the history of present illness.    Review of Systems  Constitution: Positive for weakness, malaise/fatigue and weight loss.  HENT: Negative.   Eyes: Negative.   Cardiovascular: Positive for dyspnea on exertion, irregular heartbeat and leg swelling.  Respiratory: Negative.   Hematologic/Lymphatic: Negative.   Musculoskeletal: Positive for arthritis, back pain and stiffness. Negative for joint pain.  Gastrointestinal: Positive for constipation and nausea.  Genitourinary: Negative.    All other systems reviewed and are negative.   PHYSICAL EXAM:   VS:  BP 122/68   Pulse 85   Ht 5' (1.524 m)   Wt 122 lb 3.2 oz (55.4 kg)   BMI 23.87 kg/m   Physical Exam  GEN: Elderly, in a wheelchair, in no acute distress  Neck: no JVD, carotid bruits, or masses Cardiac: Irregular irregular with 2/6 systolic murmur at the left sternal border Respiratory:  clear to auscultation bilaterally, normal work of breathing GI: soft, nontender, nondistended, + BS Ext: Race of ankle edema without cyanosis, clubbing,  Good distal pulses bilaterally Psych: euthymic mood, full affect  Wt Readings from Last 3 Encounters:  12/13/16 122 lb 3.2 oz (55.4 kg)  11/17/16 134 lb 7.7 oz (61 kg)  01/25/16 132 lb 9.6 oz (60.1 kg)      Studies/Labs Reviewed:   EKG:  EKG is  ordered today.  The ekg ordered today demonstrates  Atrial fibrillation with some paced rhythm nonspecific ST-T wave changes, right bundle branch  block  Recent Labs: 01/25/2016: ALT 10 11/13/2016: B Natriuretic Peptide 412.3; Hemoglobin 10.7; Platelets 263 11/14/2016: TSH 3.006 11/17/2016: BUN 16; Creatinine, Ser 0.68; Potassium 4.3; Sodium 133   Lipid Panel No results found for: CHOL, TRIG, HDL, CHOLHDL, VLDL, LDLCALC, LDLDIRECT  Additional studies/ records that were reviewed today include:  Echocardiogram: 11/2011      Echocardiogram: 11/2016 Study Conclusions   - Left ventricle: The cavity size was normal. Wall thickness was   increased in a pattern of moderate LVH. Indeterminant diastolic   function (atrial fibrillation). Systolic function was normal. The   estimated ejection fraction was in the range of 55% to 60%. Wall   motion was normal; there were no regional wall motion   abnormalities. - Aortic valve: Trileaflet; mildly calcified leaflets. Lambl&'s   excrescence on aortic valve. There was no stenosis. There was   mild regurgitation. - Aorta: Ascending aortic diameter: 40 mm (S). - Ascending aorta: The ascending aorta was mildly enlarged. - Mitral valve: Mildly calcified annulus. There was trivial   regurgitation. - Left atrium: The atrium was moderately dilated. - Right ventricle: The cavity size was normal. Pacer wire or   catheter noted in right ventricle. Systolic function was mildly   reduced. - Tricuspid valve: Peak RV-RA gradient (S): 39 mm Hg. - Pulmonary arteries: PA peak pressure: 54 mm Hg (S). - Systemic veins: IVC measured 2.3 cm with < 50% respirophasic   variation suggesting RA pressure 15 mmHg.   Impressions:   - The patient was in atrial fibrillation. Normal LV size with   moderate LV hypertrophy. EF 55-60%. Mild aortic insufficiency.   Moderate LAE. Moderate pulmonary hypertension. Normal RV size   with mildly decreased systolic function.      ASSESSMENT:    1. Chronic diastolic CHF (congestive heart failure) ([place])   2. Chronic atrial fibrillation (HCC)   3. Essential hypertension       PLAN:  In order of problems listed above:  Chronic  diastolic CHF currently compensated. Weight down to 122 pounds. Trace of ankle edema. We'll check bmet today. Have called advanced home health to help with CHF and wound care. F/u with Dr. Irish Lack in 4-6 weeks.  Chronic atrial fibrillation on amiodarone. No anticoagulation because of previous GI bleed.  Essential hypertension controlled    Medication Adjustments/Labs and Tests Ordered: Current medicines are reviewed at length with the patient today.  Concerns regarding medicines are outlined above.  Medication changes, Labs and Tests ordered today are listed in the Patient Instructions below. There are no Patient Instructions on file for this visit.   Sumner Boast, PA-C  12/13/2016 12:53 PM    [facility] [address], [place], [place]  [address] Phone: [phone]; Fax: [phone]

## 2016-12-14 LAB — BASIC METABOLIC PANEL
BUN / CREAT RATIO: 18 (ref 12–28)
BUN: 15 mg/dL (ref 8–27)
CHLORIDE: 91 mmol/L — AB (ref 96–106)
CO2: 27 mmol/L (ref 18–29)
Calcium: 9.9 mg/dL (ref 8.7–10.3)
Creatinine, Ser: 0.84 mg/dL (ref 0.57–1.00)
GFR calc non Af Amer: 62 mL/min/{1.73_m2} (ref >59)
GFR, EST AFRICAN AMERICAN: 72 mL/min/{1.73_m2} (ref >59)
GLUCOSE: 97 mg/dL (ref 65–99)
POTASSIUM: 3.9 mmol/L (ref 3.5–5.2)
Sodium: 135 mmol/L (ref 134–144)

## 2017-02-12 NOTE — Progress Notes (Signed)
Patient ID: [name], female   DOB: [birth year], 83 y.o.   MRN: [id number]     Cardiology Office Note   Date:  02/13/2017   ID:  [name], DOB 01-01-1926, MRN [id number]  PCP:  Mayra Neer, MD    No chief complaint on file. AFib   Wt Readings from Last 3 Encounters:  02/13/17 117 lb (53.1 kg)  12/13/16 122 lb 3.2 oz (55.4 kg)  11/17/16 134 lb 7.7 oz (61 kg)       History of Present Illness: [name] is a 83 y.o. female  With h/o PAF.  She has had multiple falls due to poor balance.  She now uses a walker.  Her diltiazem was decreased from 240 to 120 daily due to swelling by Dr. Rayann Heman in the past.  THe patient has not been checking her BP at home.   Swelling improved with less diltiazem.  Poor balance. She has not fallen recently.    No palpitations or chest pain.  She reports A chronic discoloration of her legs. No significant swelling usually. She was hospitalized in January for diastolic heart failure. She had stopped taking her Lasix at that time. Now her daughter handles her medications. Her volume status has been much better since that time.   Past Medical History:  Diagnosis Date  . Afib ([place])    No Coumadin secondary to history of GI bleed.Marland Kitchen ECHO 12/04/11 LVEF estimated by 2D at 60-65%.  . Allergy   . CKD (chronic kidney disease) stage 2, GFR 60-89 ml/min   . Diabetes mellitus with renal manifestation ([place])   . Diastolic dysfunction   . Diverticulosis of colon with hemorrhage   . Esophageal reflux   . Fracture of T11 vertebra ([place]) 2010   Compression fracture  . GERD (gastroesophageal reflux disease)    s/p Nissen Repair, Dr. Hassell Done  . History of GI diverticular bleed 11/2011   ASA stopped  . Hyperlipidemia   . Hypertension   . Hypothyroidism   . Iron deficiency anemia secondary to blood loss (chronic)   . Mixed hyperlipidemia   . Osteoarthritis   . Sick sinus syndrome (HCC)    s/p PPM  . Skin cancer of nose    Removed by derm  .  Unspecified vitamin D deficiency     Past Surgical History:  Procedure Laterality Date  . BALLOON DILATION N/A 06/29/2014   Procedure: BALLOON DILATION;  Surgeon: Garlan Fair, MD;  Location: [facility];  Service: Endoscopy;  Laterality: N/A;  . CYSTOCELE REPAIR     x 2  . ESOPHAGOGASTRODUODENOSCOPY (EGD) WITH PROPOFOL N/A 06/29/2014   Procedure: ESOPHAGOGASTRODUODENOSCOPY (EGD) WITH PROPOFOL;  Surgeon: Garlan Fair, MD;  Location: [facility];  Service: Endoscopy;  Laterality: N/A;  . EYE SURGERY     bilateral cataracts with lens implants  . HIATAL HERNIA REPAIR    . HIP ARTHROPLASTY Left 08/22/2013   Procedure: ARTHROPLASTY BIPOLAR HIP;  Surgeon: Mauri Pole, MD;  Location: [facility];  Service: Orthopedics;  Laterality: Left;  . JOINT REPLACEMENT     left hip  . KYPHOSIS SURGERY  2010 & 2012   x3  . Laparoscopic reduction of intrathoracic stomach and repair of hiatal hernia    . NISSEN FUNDOPLICATION    . PACEMAKER INSERTION  03/29/08   For SSS. SJM Zephyr XL DR Implanted by Dr. Leonia Reeves.  . TONSILLECTOMY    . VAGINAL HYSTERECTOMY     partial     Current  Outpatient Prescriptions  Medication Sig Dispense Refill  . amiodarone (PACERONE) 200 MG tablet Take 0.5 tablets (100 mg total) by mouth daily. 45 tablet 1  . Calcium Carbonate-Vitamin D (CALCIUM 600 + D PO) Take 1 tablet by mouth daily.    . cetirizine (ZYRTEC) 10 MG tablet Take 10 mg by mouth at bedtime.    . cholecalciferol (VITAMIN D) 1000 UNITS tablet Take 1,000 Units by mouth daily.    Marland Kitchen diltiazem (CARDIZEM CD) 120 MG 24 hr capsule Take 1 capsule (120 mg total) by mouth daily. 90 capsule 2  . furosemide (LASIX) 40 MG tablet TAKE 1 TABLET BY MOUTH QOD AND TAKE 1/2 TABLET ON ALTERNATING DAYS 30 tablet 11  . gabapentin (NEURONTIN) 300 MG capsule Take 2 capsules by mouth at bedtime.   1  . HYDROcodone-acetaminophen (NORCO) 10-325 MG per tablet Take 1 tablet by mouth every 8 (eight) hours as needed for moderate pain.  Back pain  0  . levothyroxine (SYNTHROID, LEVOTHROID) 50 MCG tablet Take 50 mcg by mouth daily before breakfast.    . losartan (COZAAR) 100 MG tablet Take 100 mg by mouth every morning.    . metoprolol (TOPROL-XL) 200 MG 24 hr tablet Take 200 mg by mouth every morning.     . Multiple Vitamin (MULTIVITAMIN WITH MINERALS) TABS tablet Take 1 tablet by mouth daily.    . pravastatin (PRAVACHOL) 40 MG tablet Take 40 mg by mouth daily.     No current facility-administered medications for this visit.     Allergies:   Ace inhibitors; Aspirin; Sulfa drugs cross reactors; Atorvastatin; and Welchol [colesevelam hcl]    Social History:  The patient  reports that she quit smoking about 38 years ago. Her smoking use included Cigarettes. She has never used smokeless tobacco. She reports that she does not drink alcohol or use drugs.   Family History:  The patient's *family history includes CAD in her father and sister; Colon cancer in her mother.    ROS:  Please see the history of present illness.   Otherwise, review of systems are positive for poor balance, with recurrent recent falls; right rib pain from where she fell.   All other systems are reviewed and negative.    PHYSICAL EXAM: VS:  BP 138/70   Pulse 80   Ht 5' (1.524 m)   Wt 117 lb (53.1 kg)   BMI 22.85 kg/m  , BMI Body mass index is 22.85 kg/m.  BP recheck 134/60 GEN: Well nourished, well developed, in no acute distress  HEENT: normal  Neck: no JVD, carotid bruits, or masses Cardiac: RRR; no murmurs, rubs, or gallops,no edema  Respiratory:  clear to auscultation bilaterally, normal work of breathing GI: soft, nontender, nondistended, + BS MS: no deformity or atrophy  Skin: warm and dry, no rash Neuro:  Strength and sensation are intact Psych: euthymic mood, full affect    Recent Labs: 11/13/2016: B Natriuretic Peptide 412.3; Hemoglobin 10.7; Platelets 263 11/14/2016: TSH 3.006 12/13/2016: BUN 15; Creatinine, Ser 0.84; Potassium 3.9;  Sodium 135   Lipid Panel No results found for: CHOL, TRIG, HDL, CHOLHDL, VLDL, LDLCALC, LDLDIRECT   Other studies Reviewed: Additional studies/ records that were reviewed today with results demonstrating: prior EP note.   ASSESSMENT AND PLAN:  Continue Pacerone Tablet, 200 MG,1 tablet, Orally, Once a day       Notes: Unable to tolerate anticoagulation due to GI bleed. No palpitations. I will defer her amiodarone to Dr. Rayann Heman.  She is  back in AFib.  Can see AFib burden on next pacer check. May be able to stop Amio if she is staying in AFib.  Continue rate control meds.      2. Mixed hyperlipidemia  Continue Fish Oil Capsule, 1000 MG, 1 capsule with a meal, Orally, once a day Continue Pravastatin Sodium Tablet, 40 MG, 1 tablet, Orally, Once a day Notes: LDL 108, 113 at last two checks. LDL 95 in Jan 2015.  Followed by Dr. Brigitte Pulse.   3. Cardiac pacemaker in situ  Notes: Continue pacer checks.  to be scheduled with D   4. Essential hypertension, benign  Continue Diltiazem HCl Coated Beads Tablet Extended Release 24 Hour, 120 MG, 1 tablet at the same time each day, Orally, Once a day Continue Toprol XL Tablet Extended Release 24 Hour, 200 MG, 1 tablet, Orally, Once a day Continue Losartan Potassium Tablet, 100 MG, 1 tablet, Orally, Once a day Notes: Tolerating Losartan>  BP controlled as well.  WOuld not stop diltiazem due to HTN at this time.              Current medicines are reviewed at length with the patient today.  The patient concerns regarding her medicines were addressed.  The following changes have been made:  No change  Labs/ tests ordered today include:   Orders Placed This Encounter  Procedures  . EKG 12-Lead    Recommend 150 minutes/week of aerobic exercise Low fat, low carb, high fiber diet recommended  Disposition:   FU in 1 year   Signed, Larae Grooms, MD  02/13/2017 1:10 PM    [facility] [place],  [place], [place]  [address] Phone: [phone]; Fax: [phone]

## 2017-02-13 ENCOUNTER — Encounter: Payer: Self-pay | Admitting: Interventional Cardiology

## 2017-02-13 ENCOUNTER — Encounter (INDEPENDENT_AMBULATORY_CARE_PROVIDER_SITE_OTHER): Payer: Self-pay

## 2017-02-13 ENCOUNTER — Ambulatory Visit (INDEPENDENT_AMBULATORY_CARE_PROVIDER_SITE_OTHER): Payer: Medicare Other | Admitting: Interventional Cardiology

## 2017-02-13 VITALS — BP 138/70 | HR 80 | Ht 60.0 in | Wt 117.0 lb

## 2017-02-13 DIAGNOSIS — E782 Mixed hyperlipidemia: Secondary | ICD-10-CM | POA: Diagnosis not present

## 2017-02-13 DIAGNOSIS — I10 Essential (primary) hypertension: Secondary | ICD-10-CM | POA: Diagnosis not present

## 2017-02-13 DIAGNOSIS — Z950 Presence of cardiac pacemaker: Secondary | ICD-10-CM | POA: Diagnosis not present

## 2017-02-13 DIAGNOSIS — I482 Chronic atrial fibrillation, unspecified: Secondary | ICD-10-CM

## 2017-02-13 NOTE — Patient Instructions (Signed)
Medication Instructions:  Your physician recommends that you continue on your current medications as directed. Please refer to the Current Medication list given to you today.   Labwork: None ordered.  Testing/Procedures: None ordered.  Follow-Up: Your physician wants you to follow-up in: 1 year with Dr. Irish Lack. You will receive a reminder letter in the mail two months in advance. If you don't receive a letter, please call our office to schedule the follow-up appointment.   Any Other Special Instructions Will Be Listed Below (If Applicable).  Dr. Irish Lack said if your weight goes up 3 lbs or more in 1 to 2 days, that it is okay to double the Lasix dose for 2 days and to contact the office.   If you need a refill on your cardiac medications before your next appointment, please call your pharmacy.

## 2017-03-04 ENCOUNTER — Encounter: Payer: Self-pay | Admitting: Internal Medicine

## 2017-03-05 ENCOUNTER — Encounter: Payer: Medicare Other | Admitting: Internal Medicine

## 2017-03-06 ENCOUNTER — Encounter: Payer: Self-pay | Admitting: Internal Medicine

## 2017-03-12 ENCOUNTER — Other Ambulatory Visit: Payer: Self-pay

## 2017-03-12 DIAGNOSIS — I495 Sick sinus syndrome: Secondary | ICD-10-CM

## 2017-03-12 DIAGNOSIS — I480 Paroxysmal atrial fibrillation: Secondary | ICD-10-CM

## 2017-03-12 MED ORDER — DILTIAZEM HCL ER COATED BEADS 120 MG PO CP24: 120.0000 mg | ORAL_CAPSULE | Freq: Every day | ORAL | 2 refills | Status: DC

## 2017-03-18 ENCOUNTER — Other Ambulatory Visit: Payer: Self-pay | Admitting: *Deleted

## 2017-03-18 DIAGNOSIS — I480 Paroxysmal atrial fibrillation: Secondary | ICD-10-CM

## 2017-03-18 DIAGNOSIS — I495 Sick sinus syndrome: Secondary | ICD-10-CM

## 2017-03-18 MED ORDER — DILTIAZEM HCL ER COATED BEADS 120 MG PO CP24: 120.0000 mg | ORAL_CAPSULE | Freq: Every day | ORAL | 0 refills | Status: AC

## 2017-04-04 ENCOUNTER — Ambulatory Visit (INDEPENDENT_AMBULATORY_CARE_PROVIDER_SITE_OTHER): Payer: Medicare Other | Admitting: Physician Assistant

## 2017-04-04 VITALS — BP 124/74 | HR 70 | Ht 60.0 in | Wt 117.0 lb

## 2017-04-04 DIAGNOSIS — I481 Persistent atrial fibrillation: Secondary | ICD-10-CM

## 2017-04-04 DIAGNOSIS — I509 Heart failure, unspecified: Secondary | ICD-10-CM | POA: Diagnosis not present

## 2017-04-04 DIAGNOSIS — I4819 Other persistent atrial fibrillation: Secondary | ICD-10-CM

## 2017-04-04 DIAGNOSIS — I5032 Chronic diastolic (congestive) heart failure: Secondary | ICD-10-CM

## 2017-04-04 DIAGNOSIS — Z950 Presence of cardiac pacemaker: Secondary | ICD-10-CM | POA: Diagnosis not present

## 2017-04-04 DIAGNOSIS — I110 Hypertensive heart disease with heart failure: Secondary | ICD-10-CM

## 2017-04-04 NOTE — Patient Instructions (Addendum)
Medication Instructions:    STOP TAKING  AMIODARONE    If you need a refill on your cardiac medications before your next appointment, please call your pharmacy.  Labwork: NONE ORDERED  TODAY    Testing/Procedures: NONE ORDERED  TODAY    Follow-Up:  IN 3 MONTHS WITH RENEE URUSY    Any Other Special Instructions Will Be Listed Below (If Applicable).

## 2017-04-04 NOTE — Progress Notes (Signed)
Cardiology Office Note Date:  04/04/2017  Patient ID:  [name], DOB 04-Jan-1926, MRN [id number] PCP:  Mayra Neer, MD  Cardiologist:  Dr. Irish Lack Electrophysiologist: Dr. Rayann Heman    Chief Complaint: annual f/u  History of Present Illness: [name] is a 83 y.o. female with history of AFib, CRI (II), DM, HTN, HLD, SSSx w/PPM, chronic CHF (diastolic) comes to the office today to be seen for Dr. Rayann Heman.  She was last seen by EP, Chanetta Marshall, NP march 2017, at that timed noted more AF (though still <1%) and her amio was increased to 200mg  daily.  More recently Dr. Irish Lack, last month, noted to be in AF, amio management referred to EP, was doing well. She was hospitalized in Jan for CHF exacerbation but has been doing very well since then without SOB, she ambulates at home with a cane and usually when out and about but has severe CBP 2/2 compression fractures and this is her primary limiting factor, when this bothering her will use wheelchair like today.  She denies any CP or palpitations, no dizziness, near syncope or syncope.   Device History: STJ dual chamber PPM implanted 2009 for tachy/brady   Past Medical History:  Diagnosis Date  . Afib ([place])    No Coumadin secondary to history of GI bleed.Marland Kitchen ECHO 12/04/11 LVEF estimated by 2D at 60-65%.  . Allergy   . CKD (chronic kidney disease) stage 2, GFR 60-89 ml/min   . Diabetes mellitus with renal manifestation ([place])   . Diastolic dysfunction   . Diverticulosis of colon with hemorrhage   . Esophageal reflux   . Fracture of T11 vertebra ([place]) 2010   Compression fracture  . GERD (gastroesophageal reflux disease)    s/p Nissen Repair, Dr. Hassell Done  . History of GI diverticular bleed 11/2011   ASA stopped  . Hyperlipidemia   . Hypertension   . Hypothyroidism   . Iron deficiency anemia secondary to blood loss (chronic)   . Mixed hyperlipidemia   . Osteoarthritis   . Sick sinus syndrome ([facility])    s/p PPM  . Skin cancer of  nose    Removed by derm  . Unspecified vitamin D deficiency     Past Surgical History:  Procedure Laterality Date  . BALLOON DILATION N/A 06/29/2014   Procedure: BALLOON DILATION;  Surgeon: Garlan Fair, MD;  Location: [facility];  Service: Endoscopy;  Laterality: N/A;  . CYSTOCELE REPAIR     x 2  . ESOPHAGOGASTRODUODENOSCOPY (EGD) WITH PROPOFOL N/A 06/29/2014   Procedure: ESOPHAGOGASTRODUODENOSCOPY (EGD) WITH PROPOFOL;  Surgeon: Garlan Fair, MD;  Location: [facility];  Service: Endoscopy;  Laterality: N/A;  . EYE SURGERY     bilateral cataracts with lens implants  . HIATAL HERNIA REPAIR    . HIP ARTHROPLASTY Left 08/22/2013   Procedure: ARTHROPLASTY BIPOLAR HIP;  Surgeon: Mauri Pole, MD;  Location: [facility];  Service: Orthopedics;  Laterality: Left;  . JOINT REPLACEMENT     left hip  . KYPHOSIS SURGERY  2010 & 2012   x3  . Laparoscopic reduction of intrathoracic stomach and repair of hiatal hernia    . NISSEN FUNDOPLICATION    . PACEMAKER INSERTION  03/29/08   For SSS. SJM Zephyr XL DR Implanted by Dr. Leonia Reeves.  . TONSILLECTOMY    . VAGINAL HYSTERECTOMY     partial    Current Outpatient Prescriptions  Medication Sig Dispense Refill  . amiodarone (PACERONE) 200 MG tablet Take 0.5 tablets (100  mg total) by mouth daily. 45 tablet 1  . aspirin EC 81 MG tablet Take 81 mg by mouth daily.    . Calcium Carbonate-Vitamin D (CALCIUM 600 + D PO) Take 1 tablet by mouth daily.    . cetirizine (ZYRTEC) 10 MG tablet Take 10 mg by mouth at bedtime.    . cholecalciferol (VITAMIN D) 1000 UNITS tablet Take 1,000 Units by mouth daily.    Marland Kitchen diltiazem (CARDIZEM CD) 120 MG 24 hr capsule Take 1 capsule (120 mg total) by mouth daily. 7 capsule 0  . furosemide (LASIX) 40 MG tablet TAKE 1 TABLET BY MOUTH QOD AND TAKE 1/2 TABLET ON ALTERNATING DAYS 30 tablet 11  . gabapentin (NEURONTIN) 300 MG capsule Take 2 capsules by mouth at bedtime.   1  . HYDROcodone-acetaminophen (NORCO) 10-325 MG  per tablet Take 1 tablet by mouth every 8 (eight) hours as needed for moderate pain. Back pain  0  . levothyroxine (SYNTHROID, LEVOTHROID) 50 MCG tablet Take 50 mcg by mouth daily before breakfast.    . losartan (COZAAR) 100 MG tablet Take 100 mg by mouth every morning.    . metoprolol (TOPROL-XL) 200 MG 24 hr tablet Take 200 mg by mouth every morning.     . Multiple Vitamin (MULTIVITAMIN WITH MINERALS) TABS tablet Take 1 tablet by mouth daily.    . pravastatin (PRAVACHOL) 40 MG tablet Take 40 mg by mouth daily.     No current facility-administered medications for this visit.     Allergies:   Ace inhibitors; Aspirin; Sulfa drugs cross reactors; Atorvastatin; and Welchol [colesevelam hcl]   Social History:  The patient  reports that she quit smoking about 38 years ago. Her smoking use included Cigarettes. She has never used smokeless tobacco. She reports that she does not drink alcohol or use drugs.   Family History:  The patient's family history includes CAD in her father and sister; Colon cancer in her mother.  ROS:  Please see the history of present illness.    All other systems are reviewed and otherwise negative.   PHYSICAL EXAM:  VS:  BP 124/74   Pulse 70   Ht 5' (1.524 m)   Wt 117 lb (53.1 kg)   BMI 22.85 kg/m  BMI: Body mass index is 22.85 kg/m. Well nourished, well developed, thin,  in no acute distress  HEENT: normocephalic, atraumatic  Neck: no JVD, carotid bruits or masses Cardiac:  IRRR; 1/6 SM, no rubs, or gallops Lungs:  CTA b/l, no wheezing, rhonchi or rales  Abd: soft, nontender MS: no deformity, age appropriate atrophy Ext: trace no edema  Skin: warm and dry, no rash Neuro:  No gross deficits appreciated Psych: euthymic mood, full affect  PPM site is stable, no tethering or discomfort   EKG:  Done 02/13/17 is AFib, RBBB, 80bpm, QRS 157ms PPM interrogation done today by industry and reviewed by myself: battery and lead testing are OK, she is in AF on  presentation but intermittently A pacing, undersensing her AF, AMS episodes 40,876 (3.5%), though most likely inaccurate given undersensing today her AF.  A lead sensitivity reprogrammed to 0.15mV  Echocardiogram: 11/2016 Study Conclusions - Left ventricle: The cavity size was normal. Wall thickness was increased in a pattern of moderate LVH. Indeterminant diastolic function (atrial fibrillation). Systolic function was normal. The estimated ejection fraction was in the range of 55% to 60%. Wall motion was normal; there were no regional wall motion abnormalities. - Aortic valve: Trileaflet; mildly calcified leaflets.  Lambl&'s excrescence on aortic valve. There was no stenosis. There was mild regurgitation. - Aorta: Ascending aortic diameter: 40 mm (S). - Ascending aorta: The ascending aorta was mildly enlarged. - Mitral valve: Mildly calcified annulus. There was trivial regurgitation. - Left atrium: The atrium was moderately dilated. - Right ventricle: The cavity size was normal. Pacer wire or catheter noted in right ventricle. Systolic function was mildly reduced. - Tricuspid valve: Peak RV-RA gradient (S): 39 mm Hg. - Pulmonary arteries: PA peak pressure: 54 mm Hg (S). - Systemic veins: IVC measured 2.3 cm with < 50% respirophasic variation suggesting RA pressure 15 mmHg. Impressions: - The patient was in atrial fibrillation. Normal LV size with moderate LV hypertrophy. EF 55-60%. Mild aortic insufficiency. Moderate LAE. Moderate pulmonary hypertension. Normal RV size with mildly decreased systolic function.  Recent Labs: 11/13/2016: B Natriuretic Peptide 412.3; Hemoglobin 10.7; Platelets 263 11/14/2016: TSH 3.006 12/13/2016: BUN 15; Creatinine, Ser 0.84; Potassium 3.9; Sodium 135  No results found for requested labs within last 8760 hours.   CrCl cannot be calculated (Patient's most recent lab result is older than the maximum 21 days allowed.).   Wt  Readings from Last 3 Encounters:  04/04/17 117 lb (53.1 kg)  02/13/17 117 lb (53.1 kg)  12/13/16 122 lb 3.2 oz (55.4 kg)     Other studies reviewed: Additional studies/records reviewed today include: summarized above  ASSESSMENT AND PLAN:  1. PPM, tachy/brady     Pacer function is intact  2. Persistent AFib, suspect has become permanent     CHA2DS2Vasc is at least 3, not on a/c, felt not to be a candidate 2/2 GI bleeds     In review, she has had 3 EKGs this year all are in AF, suspect her pacer undersenses her AF     She does not perceive her AF, and given not on a/c, will stop her amiodarone.  Should we run into rate control issues can use CCB     Discussed with the patient and family, they are agreeable to the plan  3. HTN, hypertensive heart disease     Looks OK, no changes  4. Chronic CHF (diastolic)      She has trace edema today with a component of chronic swelling described, her weight is stable and is without SOB/symptoms   Disposition: Stop amiodarone, I will see her back in 3 month to evaluate rate off amiodarone  Current medicines are reviewed at length with the patient today.  The patient did not have any concerns regarding medicines.     Haywood Lasso, PA-C 04/04/2017 1:43 PM     [address] [place] [address] [phone] (office)  [phone] (fax)

## 2017-04-26 ENCOUNTER — Other Ambulatory Visit: Payer: Self-pay

## 2017-09-05 ENCOUNTER — Emergency Department (HOSPITAL_COMMUNITY): Payer: Medicare Other

## 2017-09-05 ENCOUNTER — Inpatient Hospital Stay (HOSPITAL_COMMUNITY)
Admission: EM | Admit: 2017-09-05 | Discharge: 2017-09-10 | DRG: 481 | Disposition: A | Discharge status: Skilled Nursing Facility | Payer: Medicare Other | Attending: Family Medicine | Admitting: Family Medicine

## 2017-09-05 ENCOUNTER — Encounter (HOSPITAL_COMMUNITY): Payer: Self-pay

## 2017-09-05 DIAGNOSIS — W19XXXA Unspecified fall, initial encounter: Secondary | ICD-10-CM | POA: Diagnosis not present

## 2017-09-05 DIAGNOSIS — N390 Urinary tract infection, site not specified: Secondary | ICD-10-CM | POA: Diagnosis not present

## 2017-09-05 DIAGNOSIS — Z79899 Other long term (current) drug therapy: Secondary | ICD-10-CM

## 2017-09-05 DIAGNOSIS — K219 Gastro-esophageal reflux disease without esophagitis: Secondary | ICD-10-CM | POA: Diagnosis present

## 2017-09-05 DIAGNOSIS — Z882 Allergy status to sulfonamides status: Secondary | ICD-10-CM

## 2017-09-05 DIAGNOSIS — S7291XA Unspecified fracture of right femur, initial encounter for closed fracture: Secondary | ICD-10-CM

## 2017-09-05 DIAGNOSIS — Z950 Presence of cardiac pacemaker: Secondary | ICD-10-CM | POA: Diagnosis not present

## 2017-09-05 DIAGNOSIS — E860 Dehydration: Secondary | ICD-10-CM | POA: Diagnosis not present

## 2017-09-05 DIAGNOSIS — M810 Age-related osteoporosis without current pathological fracture: Secondary | ICD-10-CM | POA: Diagnosis present

## 2017-09-05 DIAGNOSIS — E782 Mixed hyperlipidemia: Secondary | ICD-10-CM | POA: Diagnosis present

## 2017-09-05 DIAGNOSIS — S72141A Displaced intertrochanteric fracture of right femur, initial encounter for closed fracture: Secondary | ICD-10-CM | POA: Diagnosis present

## 2017-09-05 DIAGNOSIS — G8929 Other chronic pain: Secondary | ICD-10-CM | POA: Diagnosis present

## 2017-09-05 DIAGNOSIS — E559 Vitamin D deficiency, unspecified: Secondary | ICD-10-CM | POA: Diagnosis present

## 2017-09-05 DIAGNOSIS — Z8249 Family history of ischemic heart disease and other diseases of the circulatory system: Secondary | ICD-10-CM

## 2017-09-05 DIAGNOSIS — S72091A Other fracture of head and neck of right femur, initial encounter for closed fracture: Secondary | ICD-10-CM | POA: Diagnosis not present

## 2017-09-05 DIAGNOSIS — E871 Hypo-osmolality and hyponatremia: Secondary | ICD-10-CM | POA: Diagnosis present

## 2017-09-05 DIAGNOSIS — W010XXA Fall on same level from slipping, tripping and stumbling without subsequent striking against object, initial encounter: Secondary | ICD-10-CM | POA: Diagnosis present

## 2017-09-05 DIAGNOSIS — I130 Hypertensive heart and chronic kidney disease with heart failure and stage 1 through stage 4 chronic kidney disease, or unspecified chronic kidney disease: Secondary | ICD-10-CM | POA: Diagnosis present

## 2017-09-05 DIAGNOSIS — Z961 Presence of intraocular lens: Secondary | ICD-10-CM | POA: Diagnosis present

## 2017-09-05 DIAGNOSIS — E1122 Type 2 diabetes mellitus with diabetic chronic kidney disease: Secondary | ICD-10-CM | POA: Diagnosis present

## 2017-09-05 DIAGNOSIS — F0390 Unspecified dementia without behavioral disturbance: Secondary | ICD-10-CM | POA: Diagnosis present

## 2017-09-05 DIAGNOSIS — I10 Essential (primary) hypertension: Secondary | ICD-10-CM | POA: Diagnosis present

## 2017-09-05 DIAGNOSIS — I495 Sick sinus syndrome: Secondary | ICD-10-CM | POA: Diagnosis present

## 2017-09-05 DIAGNOSIS — Z66 Do not resuscitate: Secondary | ICD-10-CM | POA: Diagnosis present

## 2017-09-05 DIAGNOSIS — N182 Chronic kidney disease, stage 2 (mild): Secondary | ICD-10-CM | POA: Diagnosis present

## 2017-09-05 DIAGNOSIS — Z888 Allergy status to other drugs, medicaments and biological substances status: Secondary | ICD-10-CM

## 2017-09-05 DIAGNOSIS — Z90710 Acquired absence of both cervix and uterus: Secondary | ICD-10-CM

## 2017-09-05 DIAGNOSIS — Z9841 Cataract extraction status, right eye: Secondary | ICD-10-CM

## 2017-09-05 DIAGNOSIS — R0981 Nasal congestion: Secondary | ICD-10-CM | POA: Diagnosis present

## 2017-09-05 DIAGNOSIS — H9190 Unspecified hearing loss, unspecified ear: Secondary | ICD-10-CM | POA: Diagnosis present

## 2017-09-05 DIAGNOSIS — Z886 Allergy status to analgesic agent status: Secondary | ICD-10-CM

## 2017-09-05 DIAGNOSIS — Z96642 Presence of left artificial hip joint: Secondary | ICD-10-CM | POA: Diagnosis present

## 2017-09-05 DIAGNOSIS — H04123 Dry eye syndrome of bilateral lacrimal glands: Secondary | ICD-10-CM | POA: Diagnosis present

## 2017-09-05 DIAGNOSIS — E039 Hypothyroidism, unspecified: Secondary | ICD-10-CM | POA: Diagnosis present

## 2017-09-05 DIAGNOSIS — I5032 Chronic diastolic (congestive) heart failure: Secondary | ICD-10-CM | POA: Diagnosis present

## 2017-09-05 DIAGNOSIS — I482 Chronic atrial fibrillation, unspecified: Secondary | ICD-10-CM | POA: Diagnosis present

## 2017-09-05 DIAGNOSIS — E8889 Other specified metabolic disorders: Secondary | ICD-10-CM | POA: Diagnosis present

## 2017-09-05 DIAGNOSIS — Z87891 Personal history of nicotine dependence: Secondary | ICD-10-CM

## 2017-09-05 DIAGNOSIS — T148XXA Other injury of unspecified body region, initial encounter: Secondary | ICD-10-CM

## 2017-09-05 DIAGNOSIS — S72001A Fracture of unspecified part of neck of right femur, initial encounter for closed fracture: Secondary | ICD-10-CM | POA: Diagnosis not present

## 2017-09-05 DIAGNOSIS — M25551 Pain in right hip: Secondary | ICD-10-CM | POA: Diagnosis present

## 2017-09-05 DIAGNOSIS — D62 Acute posthemorrhagic anemia: Secondary | ICD-10-CM | POA: Diagnosis not present

## 2017-09-05 DIAGNOSIS — Y92002 Bathroom of unspecified non-institutional (private) residence single-family (private) house as the place of occurrence of the external cause: Secondary | ICD-10-CM

## 2017-09-05 DIAGNOSIS — Z79890 Hormone replacement therapy (postmenopausal): Secondary | ICD-10-CM

## 2017-09-05 DIAGNOSIS — R52 Pain, unspecified: Secondary | ICD-10-CM

## 2017-09-05 DIAGNOSIS — Z9842 Cataract extraction status, left eye: Secondary | ICD-10-CM

## 2017-09-05 HISTORY — DX: Hypo-osmolality and hyponatremia: E87.1

## 2017-09-05 LAB — CBC WITH DIFFERENTIAL/PLATELET
BASOS ABS: 0 10*3/uL (ref 0.0–0.1)
BASOS PCT: 0 %
EOS ABS: 0 10*3/uL (ref 0.0–0.7)
Eosinophils Relative: 0 %
HCT: 38 % (ref 36.0–46.0)
Hemoglobin: 13 g/dL (ref 12.0–15.0)
Lymphocytes Relative: 7 %
Lymphs Abs: 1.1 10*3/uL (ref 0.7–4.0)
MCH: 32.3 pg (ref 26.0–34.0)
MCHC: 34.2 g/dL (ref 30.0–36.0)
MCV: 94.5 fL (ref 78.0–100.0)
MONOS PCT: 4 %
Monocytes Absolute: 0.7 10*3/uL (ref 0.1–1.0)
NEUTROS PCT: 89 %
Neutro Abs: 15.2 10*3/uL — ABNORMAL HIGH (ref 1.7–7.7)
PLATELETS: 269 10*3/uL (ref 150–400)
RBC: 4.02 MIL/uL (ref 3.87–5.11)
RDW: 13.1 % (ref 11.5–15.5)
WBC: 17 10*3/uL — AB (ref 4.0–10.5)

## 2017-09-05 LAB — URINALYSIS, ROUTINE W REFLEX MICROSCOPIC
BILIRUBIN URINE: NEGATIVE
Glucose, UA: >=500 mg/dL — AB
KETONES UR: 5 mg/dL — AB
NITRITE: NEGATIVE
Protein, ur: 100 mg/dL — AB
SPECIFIC GRAVITY, URINE: 1.006 (ref 1.005–1.030)
Squamous Epithelial / LPF: NONE SEEN
pH: 8 (ref 5.0–8.0)

## 2017-09-05 LAB — BASIC METABOLIC PANEL
ANION GAP: 13 (ref 5–15)
BUN: 8 mg/dL (ref 6–20)
CALCIUM: 9.1 mg/dL (ref 8.9–10.3)
CO2: 28 mmol/L (ref 22–32)
CREATININE: 0.58 mg/dL (ref 0.44–1.00)
Chloride: 89 mmol/L — ABNORMAL LOW (ref 101–111)
Glucose, Bld: 206 mg/dL — ABNORMAL HIGH (ref 65–99)
Potassium: 3.4 mmol/L — ABNORMAL LOW (ref 3.5–5.1)
SODIUM: 130 mmol/L — AB (ref 135–145)

## 2017-09-05 LAB — TYPE AND SCREEN
ABO/RH(D): B POS
Antibody Screen: NEGATIVE
Weak D: POSITIVE

## 2017-09-05 LAB — PROTIME-INR
INR: 0.96
Prothrombin Time: 12.7 seconds (ref 11.4–15.2)

## 2017-09-05 LAB — GLUCOSE, CAPILLARY: GLUCOSE-CAPILLARY: 182 mg/dL — AB (ref 65–99)

## 2017-09-05 LAB — TSH: TSH: 3.656 u[IU]/mL (ref 0.350–4.500)

## 2017-09-05 MED ORDER — ONDANSETRON HCL 4 MG/2ML IJ SOLN
4.0000 mg | Freq: Once | INTRAMUSCULAR | Status: AC
  Administered 2017-09-05: 4 mg via INTRAVENOUS
  Filled 2017-09-05: qty 2

## 2017-09-05 MED ORDER — DILTIAZEM HCL ER COATED BEADS 120 MG PO CP24
120.0000 mg | ORAL_CAPSULE | Freq: Every day | ORAL | Status: DC
  Administered 2017-09-07 – 2017-09-10 (×4): 120 mg via ORAL
  Filled 2017-09-05 (×4): qty 1

## 2017-09-05 MED ORDER — HYDROMORPHONE HCL 1 MG/ML IJ SOLN
1.0000 mg | INTRAMUSCULAR | Status: DC | PRN
  Administered 2017-09-05 – 2017-09-08 (×2): 1 mg via INTRAVENOUS
  Filled 2017-09-05 (×2): qty 1

## 2017-09-05 MED ORDER — LEVOTHYROXINE SODIUM 50 MCG PO TABS
50.0000 ug | ORAL_TABLET | Freq: Every day | ORAL | Status: DC
  Administered 2017-09-07 – 2017-09-10 (×4): 50 ug via ORAL
  Filled 2017-09-05 (×4): qty 1

## 2017-09-05 MED ORDER — ONDANSETRON HCL 4 MG PO TABS: 4.0000 mg | ORAL_TABLET | Freq: Four times a day (QID) | ORAL | Status: DC | PRN

## 2017-09-05 MED ORDER — MEMANTINE HCL 10 MG PO TABS
10.0000 mg | ORAL_TABLET | Freq: Two times a day (BID) | ORAL | Status: DC
  Administered 2017-09-06 – 2017-09-10 (×8): 10 mg via ORAL
  Filled 2017-09-05 (×8): qty 1

## 2017-09-05 MED ORDER — HYDROMORPHONE HCL 1 MG/ML IJ SOLN
1.0000 mg | Freq: Once | INTRAMUSCULAR | Status: AC
  Administered 2017-09-05: 1 mg via INTRAVENOUS
  Filled 2017-09-05: qty 1

## 2017-09-05 MED ORDER — POLYVINYL ALCOHOL 1.4 % OP SOLN: 1.0000 [drp] | OPHTHALMIC | Status: DC | PRN

## 2017-09-05 MED ORDER — HYDRALAZINE HCL 20 MG/ML IJ SOLN
10.0000 mg | Freq: Three times a day (TID) | INTRAMUSCULAR | Status: DC | PRN
Start: 2017-09-05 — End: 2017-09-10
  Administered 2017-09-05: 10 mg via INTRAVENOUS
  Filled 2017-09-05: qty 1

## 2017-09-05 MED ORDER — ACETAMINOPHEN 325 MG PO TABS
650.0000 mg | ORAL_TABLET | Freq: Four times a day (QID) | ORAL | Status: DC | PRN
  Administered 2017-09-06 – 2017-09-10 (×4): 650 mg via ORAL
  Filled 2017-09-05 (×4): qty 2

## 2017-09-05 MED ORDER — LOSARTAN POTASSIUM 50 MG PO TABS: 100.0000 mg | ORAL_TABLET | Freq: Every morning | ORAL | Status: DC

## 2017-09-05 MED ORDER — ONDANSETRON HCL 4 MG/2ML IJ SOLN
4.0000 mg | Freq: Four times a day (QID) | INTRAMUSCULAR | Status: DC | PRN
  Administered 2017-09-05 – 2017-09-08 (×2): 4 mg via INTRAVENOUS
  Filled 2017-09-05 (×2): qty 2

## 2017-09-05 MED ORDER — PRAVASTATIN SODIUM 40 MG PO TABS
40.0000 mg | ORAL_TABLET | Freq: Every day | ORAL | Status: DC
  Administered 2017-09-07 – 2017-09-10 (×4): 40 mg via ORAL
  Filled 2017-09-05 (×4): qty 1

## 2017-09-05 MED ORDER — ACETAMINOPHEN 650 MG RE SUPP: 650.0000 mg | Freq: Four times a day (QID) | RECTAL | Status: DC | PRN

## 2017-09-05 MED ORDER — SALINE SPRAY 0.65 % NA SOLN: 1.0000 | NASAL | Status: DC | PRN

## 2017-09-05 MED ORDER — HYDROCODONE-ACETAMINOPHEN 10-325 MG PO TABS
1.0000 | ORAL_TABLET | ORAL | Status: DC | PRN
  Administered 2017-09-06 – 2017-09-08 (×6): 1 via ORAL
  Filled 2017-09-05 (×6): qty 1

## 2017-09-05 MED ORDER — KCL IN DEXTROSE-NACL 20-5-0.45 MEQ/L-%-% IV SOLN
INTRAVENOUS | Status: DC
  Administered 2017-09-05 – 2017-09-07 (×2): via INTRAVENOUS
  Filled 2017-09-05 (×4): qty 1000

## 2017-09-05 MED ORDER — METOPROLOL SUCCINATE ER 100 MG PO TB24
200.0000 mg | ORAL_TABLET | Freq: Every morning | ORAL | Status: DC
  Administered 2017-09-06 – 2017-09-10 (×5): 200 mg via ORAL
  Filled 2017-09-05 (×5): qty 2

## 2017-09-05 MED ORDER — FUROSEMIDE 20 MG PO TABS
20.0000 mg | ORAL_TABLET | Freq: Every day | ORAL | Status: DC
  Administered 2017-09-07 – 2017-09-10 (×4): 20 mg via ORAL
  Filled 2017-09-05 (×4): qty 1

## 2017-09-05 MED ORDER — INSULIN ASPART 100 UNIT/ML ~~LOC~~ SOLN
0.0000 [IU] | Freq: Three times a day (TID) | SUBCUTANEOUS | Status: DC
  Administered 2017-09-06: 1 [IU] via SUBCUTANEOUS
  Administered 2017-09-06: 5 [IU] via SUBCUTANEOUS
  Administered 2017-09-07: 2 [IU] via SUBCUTANEOUS
  Administered 2017-09-07 – 2017-09-08 (×3): 1 [IU] via SUBCUTANEOUS
  Administered 2017-09-09: 2 [IU] via SUBCUTANEOUS
  Administered 2017-09-09 – 2017-09-10 (×2): 1 [IU] via SUBCUTANEOUS
  Administered 2017-09-10: 2 [IU] via SUBCUTANEOUS

## 2017-09-05 NOTE — H&P (Signed)
[facility] History and Physical  [name] HBZ:[id number] DOB: [birth year] DOA: 09/05/2017  Referring physician:  PCP: Mayra Neer, MD   Chief Complaint: "I fell."  HPI: [name] is a 83 y.o. female with past medical history of atrial fibrillation, CKD, diabetes, heart failure.  He had a slip and fall in the bathroom.  Landed in sitting position.  Not able to ambulate.  His right-sided hip pain.  No head injury or loss of consciousness but did bump her head.  ED course: X-ray showed hip fracture.  Ortho consulted who requested CT of the hip and transfer to [facility].  Hospitalist consulted for admission.   Review of Systems:  As per HPI otherwise 10 point review of systems negative.    Past Medical History:  Diagnosis Date  . Afib ([place])    No Coumadin secondary to history of GI bleed.Marland Kitchen ECHO 12/04/11 LVEF estimated by 2D at 60-65%.  . Allergy   . CKD (chronic kidney disease) stage 2, GFR 60-89 ml/min   . Diabetes mellitus with renal manifestation ([place])   . Diastolic dysfunction   . Diverticulosis of colon with hemorrhage   . Esophageal reflux   . Fracture of T11 vertebra ([place]) 2010   Compression fracture  . GERD (gastroesophageal reflux disease)    s/p Nissen Repair, Dr. Hassell Done  . History of GI diverticular bleed 11/2011   ASA stopped  . Hyperlipidemia   . Hypertension   . Hypothyroidism   . Iron deficiency anemia secondary to blood loss (chronic)   . Mixed hyperlipidemia   . Osteoarthritis   . Sick sinus syndrome ([facility])    s/p PPM  . Skin cancer of nose    Removed by derm  . Unspecified vitamin D deficiency    Past Surgical History:  Procedure Laterality Date  . BALLOON DILATION N/A 06/29/2014   Procedure: BALLOON DILATION;  Surgeon: Garlan Fair, MD;  Location: [facility];  Service: Endoscopy;  Laterality: N/A;  . CYSTOCELE REPAIR     x 2  . ESOPHAGOGASTRODUODENOSCOPY (EGD) WITH PROPOFOL N/A 06/29/2014   Procedure:  ESOPHAGOGASTRODUODENOSCOPY (EGD) WITH PROPOFOL;  Surgeon: Garlan Fair, MD;  Location: [facility];  Service: Endoscopy;  Laterality: N/A;  . EYE SURGERY     bilateral cataracts with lens implants  . HIATAL HERNIA REPAIR    . HIP ARTHROPLASTY Left 08/22/2013   Procedure: ARTHROPLASTY BIPOLAR HIP;  Surgeon: Mauri Pole, MD;  Location: [facility];  Service: Orthopedics;  Laterality: Left;  . JOINT REPLACEMENT     left hip  . KYPHOSIS SURGERY  2010 & 2012   x3  . Laparoscopic reduction of intrathoracic stomach and repair of hiatal hernia    . NISSEN FUNDOPLICATION    . PACEMAKER INSERTION  03/29/08   For SSS. SJM Zephyr XL DR Implanted by Dr. Leonia Reeves.  . TONSILLECTOMY    . VAGINAL HYSTERECTOMY     partial   Social History:  reports that she quit smoking about 38 years ago. Her smoking use included Cigarettes. She has never used smokeless tobacco. She reports that she does not drink alcohol or use drugs.  Allergies  Allergen Reactions  . Ace Inhibitors Cough  . Aspirin Nausea Only  . Sulfa Drugs Cross Reactors Nausea Only  . Atorvastatin Other (See Comments)    Leg swelling  . Welchol [Colesevelam Hcl] Hives    Family History  Problem Relation Age of Onset  . Colon cancer Mother   . CAD Father   .  CAD Sister      Prior to Admission medications   Medication Sig Start Date End Date Taking? Authorizing Provider  Calcium Carbonate-Vitamin D (CALCIUM 600 + D PO) Take 1 tablet by mouth daily.   Yes [provider]  cetirizine (ZYRTEC) 10 MG tablet Take 10 mg by mouth daily as needed for allergies.    Yes [provider]  diltiazem (CARDIZEM CD) 120 MG 24 hr capsule Take 1 capsule (120 mg total) by mouth daily. 03/18/17  Yes Jettie Booze, MD  furosemide (LASIX) 40 MG tablet TAKE 1 TABLET BY MOUTH QOD AND TAKE 1/2 TABLET ON ALTERNATING DAYS Patient taking differently: Take 20 mg by mouth daily.  12/13/16  Yes Imogene Burn, PA-C  gabapentin (NEURONTIN) 300  MG capsule Take 300 mg by mouth at bedtime.  01/20/16  Yes [provider]  HYDROcodone-acetaminophen (NORCO) 10-325 MG per tablet Take 1 tablet by mouth every 4 (four) hours as needed for moderate pain. Back pain 12/16/14  Yes [provider]  levothyroxine (SYNTHROID, LEVOTHROID) 50 MCG tablet Take 50 mcg by mouth daily before breakfast.   Yes [provider]  losartan (COZAAR) 100 MG tablet Take 100 mg by mouth every morning.   Yes [provider]  memantine (NAMENDA) 5 MG tablet Take 10 mg by mouth 2 (two) times daily. 07/30/17  Yes [provider]  metoprolol (TOPROL-XL) 200 MG 24 hr tablet Take 200 mg by mouth every morning.    Yes [provider]  polyvinyl alcohol (LIQUIFILM TEARS) 1.4 % ophthalmic solution Place 1 drop into both eyes every 4 (four) hours as needed for dry eyes.   Yes [provider]  pravastatin (PRAVACHOL) 40 MG tablet Take 40 mg by mouth daily.   Yes [provider]  sodium chloride (OCEAN) 0.65 % SOLN nasal spray Place 1 spray into both nostrils every 4 (four) hours as needed for congestion.   Yes [provider]  memantine (NAMENDA) 10 MG tablet Take 10 mg by mouth 2 (two) times daily. 08/29/17   [provider]   Physical Exam: Vitals:   09/05/17 1200 09/05/17 1255 09/05/17 1300 09/05/17 1330  BP: (!) 156/86 (!) 166/85 (!) 157/99 (!) 165/84  Pulse:  (!) 109 81 96  Resp: 13 (!) 24 13 20   Temp:      TempSrc:      SpO2:  94% 95% 97%    Wt Readings from Last 3 Encounters:  04/04/17 53.1 kg (117 lb)  02/13/17 53.1 kg (117 lb)  12/13/16 55.4 kg (122 lb 3.2 oz)    General:  Appears calm and comfortable; hard of hearing, A&Ox2 Eyes:  PERRL, EOMI, normal lids, iris ENT:  grossly normal hearing, lips & tongue Neck:  no LAD, masses or thyromegaly Cardiovascular:  RRR, no m/r/g. No LE edema.  Respiratory:  CTA bilaterally, no w/r/r. Normal respiratory effort. Abdomen:  soft,  ntnd Skin:  no rash or induration seen on limited exam, skin tear dressed Musculoskeletal:  grossly normal tone BUE/BLE; R hip pain Psychiatric:  grossly normal mood and affect, speech fluent and appropriate Neurologic:  CN 2-12 grossly intact, moves all extremities in coordinated fashion.          Labs on Admission:  Basic Metabolic Panel:  Recent Labs Lab 09/05/17 1007  NA 130*  K 3.4*  CL 89*  CO2 28  GLUCOSE 206*  BUN 8  CREATININE 0.58  CALCIUM 9.1   Liver Function Tests: No results  for input(s): AST, ALT, ALKPHOS, BILITOT, PROT, ALBUMIN in the last 168 hours. No results for input(s): LIPASE, AMYLASE in the last 168 hours. No results for input(s): AMMONIA in the last 168 hours. CBC:  Recent Labs Lab 09/05/17 1007  WBC 17.0*  NEUTROABS 15.2*  HGB 13.0  HCT 38.0  MCV 94.5  PLT 269   Cardiac Enzymes: No results for input(s): CKTOTAL, CKMB, CKMBINDEX, TROPONINI in the last 168 hours.  BNP (last 3 results)  Recent Labs  11/13/16 2258  BNP 412.3*    ProBNP (last 3 results) No results for input(s): PROBNP in the last 8760 hours.   Creatinine clearance cannot be calculated (Unknown ideal weight.)  CBG: No results for input(s): GLUCAP in the last 168 hours.  Radiological Exams on Admission: Dg Chest 1 View  Result Date: 09/05/2017 CLINICAL DATA:  Severe right hip pain.  Fall. EXAM: CHEST 1 VIEW COMPARISON:  11/13/2016 FINDINGS: Left pacer remains in place, unchanged. Cardiomegaly. Aortic atherosclerosis. No confluent airspace opacity or effusion. No acute bony abnormality. IMPRESSION: Cardiomegaly.  No active disease. Electronically Signed   By: Rolm Baptise M.D.   On: 09/05/2017 10:12   Dg Elbow Complete Right  Result Date: 09/05/2017 CLINICAL DATA:  Right elbow pain since a fall this morning. Initial encounter. EXAM: RIGHT ELBOW - COMPLETE 3+ VIEW COMPARISON:  None. FINDINGS: No acute bony or joint abnormality is identified. No joint effusion. Soft  tissues posterior to the proximal ulna appear swollen compatible with hematoma. IMPRESSION: No acute bony abnormality. Likely hematoma posterior to the proximal ulna. Electronically Signed   By: Inge Rise M.D.   On: 09/05/2017 10:12   Dg Knee 1-2 Views Right  Result Date: 09/05/2017 CLINICAL DATA:  [place] this morning. EXAM: RIGHT KNEE - 1-2 VIEW COMPARISON:  None. FINDINGS: No acute fractures identified. No osteochondral lesion or joint effusion. Mild-to-moderate tricompartmental degenerative changes. IMPRESSION: No acute bony findings. Electronically Signed   By: Marijo Sanes M.D.   On: 09/05/2017 12:49   Ct Hip Right Wo Contrast  Result Date: 09/05/2017 CLINICAL DATA:  Right femur fracture. EXAM: CT OF THE RIGHT HIP WITHOUT CONTRAST TECHNIQUE: Multidetector CT imaging of the right hip was performed according to the standard protocol. Multiplanar CT image reconstructions were also generated. COMPARISON:  Right hip x-rays from same date. CT abdomen pelvis dated November 13, 2016. FINDINGS: Bones/Joint/Cartilage Again seen is a comminuted, impacted, and displaced predominantly intertrochanteric femur fracture with extension into the basicervical femoral neck. The dominant distal fragment is displaced anteriorly by 1 bone shaft width, while the intertrochanteric fragment is displaced posteriorly by 1 bone shaft width. There is apex anterior angulation. No additional fracture is seen. No dislocation. Ligaments Suboptimally assessed by CT. Muscles and Tendons No focal abnormality. Soft tissues Soft tissue stranding over the right greater trochanter. Hematoma surrounding the fracture fragments. The bladder is prominently distended. IMPRESSION: 1. Comminuted, impacted, and displaced intertrochanteric and basicervical proximal femur fracture. Electronically Signed   By: Titus Dubin M.D.   On: 09/05/2017 12:45   Dg Hip Unilat  With Pelvis 2-3 Views Right  Result Date: 09/05/2017 CLINICAL DATA:   Recent trip and fall with severe right hip pain EXAM: DG HIP (WITH OR WITHOUT PELVIS) 2-3V RIGHT COMPARISON:  None. FINDINGS: Left hip replacement is noted. The pelvic ring is intact. A comminuted proximal right femoral fracture is noted involving primarily the intratrochanteric region with some extension into the proximal metaphysis. Impaction at the fracture site is noted. Some posterior angulation of  the distal fracture fragment is noted as well. IMPRESSION: Comminuted intratrochanteric fracture with impaction and angulation at the fracture site. Electronically Signed   By: Inez Catalina M.D.   On: 09/05/2017 10:14    EKG: pending  Assessment/Plan Active Problems:   Fracture  Hip fracture R Ortho consulted, surgery planned for tomorrow  Hypertension When necessary hydralazine 10 mg IV as needed for severe blood pressure  Knee pain Continue Tylenol No injury seen on x-ray  Dementia Cont namenda  Dry eyes Continue eyedrops as needed  Nasal congestion Continue nose spray as needed  Hypertension When necessary hydralazine 10 mg IV as needed for severe blood pressure Cont cozaar  Hypothyroidism Cont OP synthroid 50 mcg qd No signs of hyper or hypothyroidism Check TSH  Afib Cont cardizem & Toprol XL  Chronic pain Hold Neurontin Cont norco prn  Code Status: FC  DVT Prophylaxis: SCDs Family Communication: at bedside Disposition Plan: Pending Improvement  Hyperlipidemia Continue statin   Status: med/surg inpt  Elwin Mocha, MD Family Medicine [facility] [email or web address] Password [email or web address]

## 2017-09-05 NOTE — Progress Notes (Signed)
Consult acknowledged. Patient has severely comminuted R peritrochanteric femur fx + basicervical femoral neck fracture. Due to complexity of the fracture, I have contact Ortho Trauma, who request transfer to [facility]. They will see the patient and they plan for surgery, possibly tomorrow. NPO after MN.

## 2017-09-05 NOTE — ED Notes (Signed)
[name], daughter [phone]

## 2017-09-05 NOTE — ED Provider Notes (Signed)
[facility] DEPT Provider Note   CSN: [id number] Arrival date & time: 09/05/17  0904     History   Chief Complaint Chief Complaint  Patient presents with  . Fall  . Laceration    HPI [name] is a 83 y.o. female.  HPI Patient presents after a fall.  Slipped while attempting to walk to the bathroom.  Complaining of pain and her right hip.  Has not been able to ambulate.  Previous replacement of left hip.  No chest pain.  States she may have bumped her head but there was no loss of consciousness.  No head or neck pain.  No chest or abdominal pain.  She has a history of A. fib and a pacemaker but is not on anticoagulation. Past Medical History:  Diagnosis Date  . Afib ([place])    No Coumadin secondary to history of GI bleed.Marland Kitchen ECHO 12/04/11 LVEF estimated by 2D at 60-65%.  . Allergy   . CKD (chronic kidney disease) stage 2, GFR 60-89 ml/min   . Diabetes mellitus with renal manifestation ([place])   . Diastolic dysfunction   . Diverticulosis of colon with hemorrhage   . Esophageal reflux   . Fracture of T11 vertebra ([place]) 2010   Compression fracture  . GERD (gastroesophageal reflux disease)    s/p Nissen Repair, Dr. Hassell Done  . History of GI diverticular bleed 11/2011   ASA stopped  . Hyperlipidemia   . Hypertension   . Hypothyroidism   . Iron deficiency anemia secondary to blood loss (chronic)   . Mixed hyperlipidemia   . Osteoarthritis   . Sick sinus syndrome ([facility])    s/p PPM  . Skin cancer of nose    Removed by derm  . Unspecified vitamin D deficiency     Patient Active Problem List   Diagnosis Date Noted  . Acute low back pain without sciatica   . Acute on chronic congestive heart failure ([place])   . DNR (do not resuscitate) discussion   . Acute on chronic diastolic CHF (congestive heart failure) ([place]) 11/14/2016  . Back pain 11/14/2016  . Essential hypertension 04/07/2015  . Edema 12/20/2014  . Mixed hyperlipidemia 04/06/2014  .  Sick sinus syndrome ([place]) 12/14/2013  . Pacemaker 12/14/2013  . Unspecified constipation 08/27/2013  . Hypokalemia 08/24/2013  . Hypomagnesemia 08/24/2013  . Chronic diastolic CHF (congestive heart failure) ([place]) 08/24/2013  . Hypothyroidism, acquired 08/23/2013  . Hyponatremia 08/21/2013  . Left displaced femoral neck fracture ([place]) 08/21/2013  . History of sick sinus syndrome 08/21/2013  . Breast cyst 08/21/2013  . GIB (gastrointestinal bleeding) 12/10/2011  . Diverticulosis of colon with hemorrhage 12/10/2011  . Hyperglycemia 12/10/2011  . Chronic atrial fibrillation ([place]) 12/10/2011  . Gallstones 09/14/2011    Past Surgical History:  Procedure Laterality Date  . BALLOON DILATION N/A 06/29/2014   Procedure: BALLOON DILATION;  Surgeon: Garlan Fair, MD;  Location: [facility];  Service: Endoscopy;  Laterality: N/A;  . CYSTOCELE REPAIR     x 2  . ESOPHAGOGASTRODUODENOSCOPY (EGD) WITH PROPOFOL N/A 06/29/2014   Procedure: ESOPHAGOGASTRODUODENOSCOPY (EGD) WITH PROPOFOL;  Surgeon: Garlan Fair, MD;  Location: [facility];  Service: Endoscopy;  Laterality: N/A;  . EYE SURGERY     bilateral cataracts with lens implants  . HIATAL HERNIA REPAIR    . HIP ARTHROPLASTY Left 08/22/2013   Procedure: ARTHROPLASTY BIPOLAR HIP;  Surgeon: Mauri Pole, MD;  Location: [facility];  Service: Orthopedics;  Laterality: Left;  .  JOINT REPLACEMENT     left hip  . KYPHOSIS SURGERY  2010 & 2012   x3  . Laparoscopic reduction of intrathoracic stomach and repair of hiatal hernia    . NISSEN FUNDOPLICATION    . PACEMAKER INSERTION  03/29/08   For SSS. SJM Zephyr XL DR Implanted by Dr. Leonia Reeves.  . TONSILLECTOMY    . VAGINAL HYSTERECTOMY     partial    OB History    No data available       Home Medications    Prior to Admission medications   Medication Sig Start Date End Date Taking? Authorizing Provider  Calcium Carbonate-Vitamin D (CALCIUM 600 + D PO) Take 1 tablet by mouth daily.    Yes [provider]  diltiazem (CARDIZEM CD) 120 MG 24 hr capsule Take 1 capsule (120 mg total) by mouth daily. 03/18/17  Yes Jettie Booze, MD  furosemide (LASIX) 40 MG tablet TAKE 1 TABLET BY MOUTH QOD AND TAKE 1/2 TABLET ON ALTERNATING DAYS Patient taking differently: Take 20 mg by mouth daily.  12/13/16  Yes Imogene Burn, PA-C  gabapentin (NEURONTIN) 300 MG capsule Take 300 mg by mouth at bedtime.  01/20/16  Yes [provider]  levothyroxine (SYNTHROID, LEVOTHROID) 50 MCG tablet Take 50 mcg by mouth daily before breakfast.   Yes [provider]  losartan (COZAAR) 100 MG tablet Take 100 mg by mouth every morning.   Yes [provider]  metoprolol (TOPROL-XL) 200 MG 24 hr tablet Take 200 mg by mouth every morning.    Yes [provider]  pravastatin (PRAVACHOL) 40 MG tablet Take 40 mg by mouth daily.   Yes [provider]  aspirin EC 81 MG tablet Take 81 mg by mouth daily.    [provider]  cetirizine (ZYRTEC) 10 MG tablet Take 10 mg by mouth at bedtime.    [provider]  cholecalciferol (VITAMIN D) 1000 UNITS tablet Take 1,000 Units by mouth daily.    [provider]  HYDROcodone-acetaminophen (NORCO) 10-325 MG per tablet Take 1 tablet by mouth every 8 (eight) hours as needed for moderate pain. Back pain 12/16/14   [provider]  Multiple Vitamin (MULTIVITAMIN WITH MINERALS) TABS tablet Take 1 tablet by mouth daily.    [provider]    Family History Family History  Problem Relation Age of Onset  . Colon cancer Mother   . CAD Father   . CAD Sister     Social History Social History  Substance Use Topics  . Smoking status: Former Smoker    Types: Cigarettes    Quit date: 12/09/1978  . Smokeless tobacco: Never Used  . Alcohol use No     Allergies   Ace inhibitors; Aspirin; Sulfa drugs cross reactors; Atorvastatin; and Welchol [colesevelam hcl]   Review of  Systems Review of Systems  Constitutional: Negative for appetite change.  HENT: Negative for congestion.   Respiratory: Negative for shortness of breath.   Cardiovascular: Negative for chest pain.  Gastrointestinal: Negative for abdominal distention.  Genitourinary: Negative for flank pain.  Musculoskeletal: Negative for back pain and neck pain.       Right hip pain  Skin: Positive for wound.       Right elbow skin tear  Neurological: Negative for weakness.     Physical Exam Updated Vital Signs BP (!) 156/86   Pulse 71   Temp 98.2 F (36.8 C) (Oral)   Resp 13   SpO2 93%  Physical Exam  Constitutional: She appears well-developed and well-nourished.  HENT:  Head: Normocephalic and atraumatic.  Neck: Neck supple.  Painless range of motion without midline tenderness  Cardiovascular:  Irregular rhythm  Pulmonary/Chest: Effort normal. She has no rales.  Pacemaker to the left chest wall  Musculoskeletal: She exhibits tenderness.  Tenderness to right hip.  Shortening of lower extremity with some external rotation.  Neurovascular intact in feet.  There is a skin tear on the dorsal aspect of the right elbow.  Neurovascular intact in hand.  Good range of motion in the elbow.  No tenderness over the shoulder.  Skin: Skin is warm. Capillary refill takes less than 2 seconds.     ED Treatments / Results  Labs (all labs ordered are listed, but only abnormal results are displayed) Labs Reviewed  BASIC METABOLIC PANEL - Abnormal; Notable for the following:       Result Value   Sodium 130 (*)    Potassium 3.4 (*)    Chloride 89 (*)    Glucose, Bld 206 (*)    All other components within normal limits  CBC WITH DIFFERENTIAL/PLATELET - Abnormal; Notable for the following:    WBC 17.0 (*)    Neutro Abs 15.2 (*)    All other components within normal limits  PROTIME-INR  URINALYSIS, ROUTINE W REFLEX MICROSCOPIC  TYPE AND SCREEN    EKG  EKG Interpretation None        Radiology Dg Chest 1 View  Result Date: 09/05/2017 CLINICAL DATA:  Severe right hip pain.  Fall. EXAM: CHEST 1 VIEW COMPARISON:  11/13/2016 FINDINGS: Left pacer remains in place, unchanged. Cardiomegaly. Aortic atherosclerosis. No confluent airspace opacity or effusion. No acute bony abnormality. IMPRESSION: Cardiomegaly.  No active disease. Electronically Signed   By: Rolm Baptise M.D.   On: 09/05/2017 10:12   Dg Elbow Complete Right  Result Date: 09/05/2017 CLINICAL DATA:  Right elbow pain since a fall this morning. Initial encounter. EXAM: RIGHT ELBOW - COMPLETE 3+ VIEW COMPARISON:  None. FINDINGS: No acute bony or joint abnormality is identified. No joint effusion. Soft tissues posterior to the proximal ulna appear swollen compatible with hematoma. IMPRESSION: No acute bony abnormality. Likely hematoma posterior to the proximal ulna. Electronically Signed   By: Inge Rise M.D.   On: 09/05/2017 10:12   Dg Hip Unilat  With Pelvis 2-3 Views Right  Result Date: 09/05/2017 CLINICAL DATA:  Recent trip and fall with severe right hip pain EXAM: DG HIP (WITH OR WITHOUT PELVIS) 2-3V RIGHT COMPARISON:  None. FINDINGS: Left hip replacement is noted. The pelvic ring is intact. A comminuted proximal right femoral fracture is noted involving primarily the intratrochanteric region with some extension into the proximal metaphysis. Impaction at the fracture site is noted. Some posterior angulation of the distal fracture fragment is noted as well. IMPRESSION: Comminuted intratrochanteric fracture with impaction and angulation at the fracture site. Electronically Signed   By: Inez Catalina M.D.   On: 09/05/2017 10:14    Procedures Procedures (including critical care time)  Medications Ordered in ED Medications  ondansetron (ZOFRAN) injection 4 mg (4 mg Intravenous Given 09/05/17 1009)     Initial Impression / Assessment and Plan / ED Course  I have reviewed the triage vital signs and the  nursing notes.  Pertinent labs & imaging results that were available during my care of the patient were reviewed by me and considered in my medical decision making (see chart for details).    ED  ECG REPORT   Date: 09/05/2017  Rate:83  Rhythm: afib and paced  QRS Axis: normal  Intervals: paced and afib  ST/T Wave abnormalities: paced  Conduction Disutrbances:paced  Narrative Interpretation:      Patient with fall.  Has laceration right elbow that appears to just be skin tear and was dressed.  Found to have ugly right hip fracture.  Discussed with Dr. Alvan Dame and Dr. Lyla Glassing.  Dr. Rise Patience tach.  Requested CT scan be done of the hip.  States patient will require admission to the hospital but does not know if it will be to [facility] or [facility].  States he will not be able to look at the CT and be able to determine this until after he finishes in the operating room.  States this could be some hours.  Request admission to hospitalist. Final Clinical Impressions(s) / ED Diagnoses   Final diagnoses:  Fall, initial encounter  Closed fracture of right hip, initial encounter [facility])    New Prescriptions New Prescriptions   No medications on file     Davonna Belling, MD 09/05/17 1222

## 2017-09-05 NOTE — Progress Notes (Signed)
Patient ID: [name], female   DOB: Mar 08, 1926, 83 y.o.   MRN: [id number]  Plan will be for ortho to fix hip tomorrow pending medical clearance. Please make NPO after midnight. Ortho will formally consult in AM.    Lisette Abu, PA-C Orthopedic Surgery [phone]
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
## 2017-09-05 NOTE — ED Notes (Signed)
Patient transported to X-ray 

## 2017-09-06 ENCOUNTER — Encounter (HOSPITAL_COMMUNITY): Payer: Self-pay | Admitting: Certified Registered"

## 2017-09-06 ENCOUNTER — Encounter (HOSPITAL_COMMUNITY)
Admission: EM | Disposition: A | Discharge status: Skilled Nursing Facility | Payer: Self-pay | Source: Home / Self Care | Attending: Family Medicine

## 2017-09-06 ENCOUNTER — Inpatient Hospital Stay (HOSPITAL_COMMUNITY): Payer: Medicare Other

## 2017-09-06 ENCOUNTER — Inpatient Hospital Stay (HOSPITAL_COMMUNITY): Payer: Medicare Other | Admitting: Certified Registered"

## 2017-09-06 DIAGNOSIS — I5032 Chronic diastolic (congestive) heart failure: Secondary | ICD-10-CM

## 2017-09-06 DIAGNOSIS — S72091A Other fracture of head and neck of right femur, initial encounter for closed fracture: Secondary | ICD-10-CM

## 2017-09-06 DIAGNOSIS — I10 Essential (primary) hypertension: Secondary | ICD-10-CM

## 2017-09-06 DIAGNOSIS — I482 Chronic atrial fibrillation: Secondary | ICD-10-CM

## 2017-09-06 DIAGNOSIS — I495 Sick sinus syndrome: Secondary | ICD-10-CM

## 2017-09-06 HISTORY — PX: INTRAMEDULLARY (IM) NAIL INTERTROCHANTERIC: SHX5875

## 2017-09-06 LAB — CBC
HEMATOCRIT: 35.5 % — AB (ref 36.0–46.0)
HEMOGLOBIN: 12.3 g/dL (ref 12.0–15.0)
MCH: 32.5 pg (ref 26.0–34.0)
MCHC: 34.6 g/dL (ref 30.0–36.0)
MCV: 93.7 fL (ref 78.0–100.0)
Platelets: 206 10*3/uL (ref 150–400)
RBC: 3.79 MIL/uL — ABNORMAL LOW (ref 3.87–5.11)
RDW: 13.7 % (ref 11.5–15.5)
WBC: 13.5 10*3/uL — ABNORMAL HIGH (ref 4.0–10.5)

## 2017-09-06 LAB — GLUCOSE, CAPILLARY
GLUCOSE-CAPILLARY: 144 mg/dL — AB (ref 65–99)
GLUCOSE-CAPILLARY: 268 mg/dL — AB (ref 65–99)
Glucose-Capillary: 135 mg/dL — ABNORMAL HIGH (ref 65–99)
Glucose-Capillary: 153 mg/dL — ABNORMAL HIGH (ref 65–99)
Glucose-Capillary: 187 mg/dL — ABNORMAL HIGH (ref 65–99)

## 2017-09-06 LAB — TYPE AND SCREEN
ABO/RH(D): B NEG
Antibody Screen: NEGATIVE
WEAK D: POSITIVE

## 2017-09-06 LAB — SODIUM, URINE, RANDOM

## 2017-09-06 LAB — OSMOLALITY, URINE: Osmolality, Ur: 696 mOsm/kg (ref 300–900)

## 2017-09-06 LAB — CREATININE, SERUM
Creatinine, Ser: 0.9 mg/dL (ref 0.44–1.00)
GFR calc Af Amer: >60 mL/min (ref >60)
GFR calc non Af Amer: 55 mL/min — ABNORMAL LOW (ref >60)

## 2017-09-06 SURGERY — FIXATION, FRACTURE, INTERTROCHANTERIC, WITH INTRAMEDULLARY ROD
Anesthesia: General | Laterality: Right

## 2017-09-06 MED ORDER — LABETALOL HCL 5 MG/ML IV SOLN
INTRAVENOUS | Status: DC | PRN
  Administered 2017-09-06: 5 mg via INTRAVENOUS

## 2017-09-06 MED ORDER — DEXAMETHASONE SODIUM PHOSPHATE 4 MG/ML IJ SOLN
INTRAMUSCULAR | Status: DC | PRN
  Administered 2017-09-06: 8 mg via INTRAVENOUS

## 2017-09-06 MED ORDER — ACETAMINOPHEN 650 MG RE SUPP: 650.0000 mg | Freq: Four times a day (QID) | RECTAL | Status: DC | PRN

## 2017-09-06 MED ORDER — CEFAZOLIN SODIUM-DEXTROSE 2-4 GM/100ML-% IV SOLN
2.0000 g | Freq: Four times a day (QID) | INTRAVENOUS | Status: AC
  Administered 2017-09-06 (×2): 2 g via INTRAVENOUS
  Filled 2017-09-06 (×2): qty 100

## 2017-09-06 MED ORDER — 0.9 % SODIUM CHLORIDE (POUR BTL) OPTIME
TOPICAL | Status: DC | PRN
  Administered 2017-09-06: 1000 mL

## 2017-09-06 MED ORDER — CEFAZOLIN SODIUM-DEXTROSE 2-3 GM-%(50ML) IV SOLR
INTRAVENOUS | Status: DC | PRN
  Administered 2017-09-06: 2 g via INTRAVENOUS

## 2017-09-06 MED ORDER — ONDANSETRON HCL 4 MG/2ML IJ SOLN
INTRAMUSCULAR | Status: DC | PRN
  Administered 2017-09-06: 4 mg via INTRAVENOUS

## 2017-09-06 MED ORDER — DEXAMETHASONE SODIUM PHOSPHATE 10 MG/ML IJ SOLN
INTRAMUSCULAR | Status: AC
  Filled 2017-09-06: qty 1

## 2017-09-06 MED ORDER — PROPOFOL 10 MG/ML IV BOLUS
INTRAVENOUS | Status: AC
  Filled 2017-09-06: qty 20

## 2017-09-06 MED ORDER — SUCCINYLCHOLINE CHLORIDE 200 MG/10ML IV SOSY
PREFILLED_SYRINGE | INTRAVENOUS | Status: AC
  Filled 2017-09-06: qty 10

## 2017-09-06 MED ORDER — DOCUSATE SODIUM 100 MG PO CAPS
100.0000 mg | ORAL_CAPSULE | Freq: Two times a day (BID) | ORAL | Status: DC
  Administered 2017-09-06 – 2017-09-10 (×8): 100 mg via ORAL
  Filled 2017-09-06 (×8): qty 1

## 2017-09-06 MED ORDER — ONDANSETRON HCL 4 MG PO TABS: 4.0000 mg | ORAL_TABLET | Freq: Four times a day (QID) | ORAL | Status: DC | PRN

## 2017-09-06 MED ORDER — ROCURONIUM BROMIDE 10 MG/ML (PF) SYRINGE
PREFILLED_SYRINGE | INTRAVENOUS | Status: AC
  Filled 2017-09-06: qty 5

## 2017-09-06 MED ORDER — ROCURONIUM BROMIDE 100 MG/10ML IV SOLN
INTRAVENOUS | Status: DC | PRN
  Administered 2017-09-06: 40 mg via INTRAVENOUS
  Administered 2017-09-06: 10 mg via INTRAVENOUS

## 2017-09-06 MED ORDER — ACETAMINOPHEN 325 MG PO TABS
650.0000 mg | ORAL_TABLET | Freq: Four times a day (QID) | ORAL | Status: DC | PRN
Start: 2017-09-06 — End: 2017-09-06

## 2017-09-06 MED ORDER — FENTANYL CITRATE (PF) 100 MCG/2ML IJ SOLN: 25.0000 ug | INTRAMUSCULAR | Status: DC | PRN

## 2017-09-06 MED ORDER — METOCLOPRAMIDE HCL 5 MG PO TABS: 5.0000 mg | ORAL_TABLET | Freq: Three times a day (TID) | ORAL | Status: DC | PRN

## 2017-09-06 MED ORDER — CEFAZOLIN SODIUM-DEXTROSE 2-4 GM/100ML-% IV SOLN
INTRAVENOUS | Status: AC
  Filled 2017-09-06: qty 100

## 2017-09-06 MED ORDER — FENTANYL CITRATE (PF) 100 MCG/2ML IJ SOLN
INTRAMUSCULAR | Status: DC | PRN
  Administered 2017-09-06 (×3): 50 ug via INTRAVENOUS

## 2017-09-06 MED ORDER — PHENYLEPHRINE 40 MCG/ML (10ML) SYRINGE FOR IV PUSH (FOR BLOOD PRESSURE SUPPORT)
PREFILLED_SYRINGE | INTRAVENOUS | Status: DC | PRN
  Administered 2017-09-06 (×4): 80 ug via INTRAVENOUS

## 2017-09-06 MED ORDER — EPHEDRINE 5 MG/ML INJ
INTRAVENOUS | Status: AC
  Filled 2017-09-06: qty 10

## 2017-09-06 MED ORDER — SUGAMMADEX SODIUM 200 MG/2ML IV SOLN
INTRAVENOUS | Status: DC | PRN
Start: 2017-09-06 — End: 2017-09-06
  Administered 2017-09-06: 100 mg via INTRAVENOUS

## 2017-09-06 MED ORDER — PHENYLEPHRINE HCL 10 MG/ML IJ SOLN
INTRAVENOUS | Status: DC | PRN
  Administered 2017-09-06: 50 ug/min via INTRAVENOUS

## 2017-09-06 MED ORDER — ONDANSETRON HCL 4 MG/2ML IJ SOLN: 4.0000 mg | Freq: Four times a day (QID) | INTRAMUSCULAR | Status: DC | PRN

## 2017-09-06 MED ORDER — PHENYLEPHRINE 40 MCG/ML (10ML) SYRINGE FOR IV PUSH (FOR BLOOD PRESSURE SUPPORT)
PREFILLED_SYRINGE | INTRAVENOUS | Status: AC
  Filled 2017-09-06: qty 10

## 2017-09-06 MED ORDER — PHENOL 1.4 % MT LIQD: 1.0000 | OROMUCOSAL | Status: DC | PRN

## 2017-09-06 MED ORDER — FENTANYL CITRATE (PF) 250 MCG/5ML IJ SOLN
INTRAMUSCULAR | Status: AC
  Filled 2017-09-06: qty 5

## 2017-09-06 MED ORDER — PROPOFOL 10 MG/ML IV BOLUS
INTRAVENOUS | Status: DC | PRN
Start: 2017-09-06 — End: 2017-09-06
  Administered 2017-09-06: 80 mg via INTRAVENOUS

## 2017-09-06 MED ORDER — LIDOCAINE 2% (20 MG/ML) 5 ML SYRINGE
INTRAMUSCULAR | Status: AC
  Filled 2017-09-06: qty 5

## 2017-09-06 MED ORDER — METOPROLOL TARTRATE 5 MG/5ML IV SOLN: 5.0000 mg | Freq: Four times a day (QID) | INTRAVENOUS | Status: DC | PRN

## 2017-09-06 MED ORDER — MENTHOL 3 MG MT LOZG: 1.0000 | LOZENGE | OROMUCOSAL | Status: DC | PRN

## 2017-09-06 MED ORDER — METOCLOPRAMIDE HCL 5 MG/ML IJ SOLN: 5.0000 mg | Freq: Three times a day (TID) | INTRAMUSCULAR | Status: DC | PRN

## 2017-09-06 MED ORDER — LACTATED RINGERS IV SOLN: INTRAVENOUS | Status: DC

## 2017-09-06 MED ORDER — LACTATED RINGERS IV SOLN
INTRAVENOUS | Status: DC | PRN
  Administered 2017-09-06 (×2): via INTRAVENOUS

## 2017-09-06 MED ORDER — ENOXAPARIN SODIUM 40 MG/0.4ML ~~LOC~~ SOLN
40.0000 mg | SUBCUTANEOUS | Status: DC
  Administered 2017-09-07 – 2017-09-10 (×4): 40 mg via SUBCUTANEOUS
  Filled 2017-09-06 (×4): qty 0.4

## 2017-09-06 MED ORDER — ONDANSETRON HCL 4 MG/2ML IJ SOLN
INTRAMUSCULAR | Status: AC
  Filled 2017-09-06: qty 2

## 2017-09-06 SURGICAL SUPPLY — 52 items
BIT DRILL 4.3MMS DISTAL GRDTED (BIT) IMPLANT
BNDG COHESIVE 6X5 TAN STRL LF (GAUZE/BANDAGES/DRESSINGS) IMPLANT
BRUSH SCRUB SURG 4.25 DISP (MISCELLANEOUS) ×6 IMPLANT
COVER PERINEAL POST (MISCELLANEOUS) ×3 IMPLANT
COVER SURGICAL LIGHT HANDLE (MISCELLANEOUS) ×6 IMPLANT
DRAPE C-ARMOR (DRAPES) ×3 IMPLANT
DRAPE HALF SHEET 40X57 (DRAPES) IMPLANT
DRAPE ORTHO SPLIT 77X108 STRL (DRAPES)
DRAPE STERI IOBAN 125X83 (DRAPES) ×3 IMPLANT
DRAPE SURG ORHT 6 SPLT 77X108 (DRAPES) IMPLANT
DRAPE U-SHAPE 47X51 STRL (DRAPES) ×3 IMPLANT
DRILL 4.3MMS DISTAL GRADUATED (BIT) ×3
DRSG EMULSION OIL 3X3 NADH (GAUZE/BANDAGES/DRESSINGS) ×3 IMPLANT
DRSG MEPILEX BORDER 4X4 (GAUZE/BANDAGES/DRESSINGS) ×7 IMPLANT
DRSG MEPILEX BORDER 4X8 (GAUZE/BANDAGES/DRESSINGS) ×3 IMPLANT
ELECT REM PT RETURN 9FT ADLT (ELECTROSURGICAL) ×3
ELECTRODE REM PT RTRN 9FT ADLT (ELECTROSURGICAL) ×1 IMPLANT
GLOVE BIO SURGEON STRL SZ7.5 (GLOVE) ×3 IMPLANT
GLOVE BIO SURGEON STRL SZ8 (GLOVE) ×3 IMPLANT
GLOVE BIOGEL PI IND STRL 7.5 (GLOVE) ×1 IMPLANT
GLOVE BIOGEL PI IND STRL 8 (GLOVE) ×1 IMPLANT
GLOVE BIOGEL PI INDICATOR 7.5 (GLOVE) ×2
GLOVE BIOGEL PI INDICATOR 8 (GLOVE) ×2
GOWN STRL REUS W/ TWL LRG LVL3 (GOWN DISPOSABLE) ×2 IMPLANT
GOWN STRL REUS W/ TWL XL LVL3 (GOWN DISPOSABLE) ×1 IMPLANT
GOWN STRL REUS W/TWL LRG LVL3 (GOWN DISPOSABLE) ×6
GOWN STRL REUS W/TWL XL LVL3 (GOWN DISPOSABLE) ×3
GUIDEPIN 3.2X17.5 THRD DISP (PIN) ×2 IMPLANT
GUIDEWIRE BALL NOSE 100CM (WIRE) ×2 IMPLANT
HIP FRA NAIL LAG SCREW 10.5X90 (Orthopedic Implant) ×3 IMPLANT
KIT BASIN OR (CUSTOM PROCEDURE TRAY) ×3 IMPLANT
KIT ROOM TURNOVER OR (KITS) ×3 IMPLANT
LINER BOOT UNIVERSAL DISP (MISCELLANEOUS) ×3 IMPLANT
MANIFOLD NEPTUNE II (INSTRUMENTS) ×3 IMPLANT
NAIL HIP FRACTURE 11X380MM (Nail) ×2 IMPLANT
NS IRRIG 1000ML POUR BTL (IV SOLUTION) ×3 IMPLANT
PACK GENERAL/GYN (CUSTOM PROCEDURE TRAY) ×3 IMPLANT
PAD ARMBOARD 7.5X6 YLW CONV (MISCELLANEOUS) ×6 IMPLANT
SCREW BONE CORTICAL 5.0X40 (Screw) ×2 IMPLANT
SCREW LAG HIP FRA NAIL 10.5X90 (Orthopedic Implant) IMPLANT
STAPLER VISISTAT 35W (STAPLE) ×3 IMPLANT
STOCKINETTE IMPERVIOUS LG (DRAPES) IMPLANT
SUT ETHILON 3 0 PS 1 (SUTURE) ×3 IMPLANT
SUT VIC AB 0 CT1 27 (SUTURE) ×3
SUT VIC AB 0 CT1 27XBRD ANBCTR (SUTURE) ×1 IMPLANT
SUT VIC AB 1 CT1 27 (SUTURE) ×3
SUT VIC AB 1 CT1 27XBRD ANBCTR (SUTURE) ×1 IMPLANT
SUT VIC AB 2-0 CT1 27 (SUTURE) ×3
SUT VIC AB 2-0 CT1 TAPERPNT 27 (SUTURE) ×1 IMPLANT
TOWEL OR 17X24 6PK STRL BLUE (TOWEL DISPOSABLE) ×3 IMPLANT
TOWEL OR 17X26 10 PK STRL BLUE (TOWEL DISPOSABLE) ×6 IMPLANT
WATER STERILE IRR 1000ML POUR (IV SOLUTION) ×3 IMPLANT

## 2017-09-06 NOTE — Evaluation (Signed)
Clinical/Bedside Swallow Evaluation Patient Details  Name: [name] MRN: [id number] Date of Birth: 09-26-1926  Today's Date: 09/06/2017 Time: SLP Start Time (ACUTE ONLY): 1345 SLP Stop Time (ACUTE ONLY): 1400 SLP Time Calculation (min) (ACUTE ONLY): 15 min  Past Medical History:  Past Medical History:  Diagnosis Date  . Afib ([place])    No Coumadin secondary to history of GI bleed.Marland Kitchen ECHO 12/04/11 LVEF estimated by 2D at 60-65%.  . Allergy   . Chronic hyponatremia   . CKD (chronic kidney disease) stage 2, GFR 60-89 ml/min   . Diabetes mellitus with renal manifestation ([place])   . Diastolic dysfunction   . Diverticulosis of colon with hemorrhage   . Esophageal reflux   . Fracture of T11 vertebra ([place]) 2010   Compression fracture  . GERD (gastroesophageal reflux disease)    s/p Nissen Repair, Dr. Hassell Done  . History of GI diverticular bleed 11/2011   ASA stopped  . Hyperlipidemia   . Hypertension   . Hypothyroidism   . Iron deficiency anemia secondary to blood loss (chronic)   . Mixed hyperlipidemia   . Osteoarthritis   . Sick sinus syndrome ([facility])    s/p PPM  . Skin cancer of nose    Removed by derm  . Unspecified vitamin D deficiency    Past Surgical History:  Past Surgical History:  Procedure Laterality Date  . BALLOON DILATION N/A 06/29/2014   Procedure: BALLOON DILATION;  Surgeon: Garlan Fair, MD;  Location: [facility];  Service: Endoscopy;  Laterality: N/A;  . CYSTOCELE REPAIR     x 2  . ESOPHAGOGASTRODUODENOSCOPY (EGD) WITH PROPOFOL N/A 06/29/2014   Procedure: ESOPHAGOGASTRODUODENOSCOPY (EGD) WITH PROPOFOL;  Surgeon: Garlan Fair, MD;  Location: [facility];  Service: Endoscopy;  Laterality: N/A;  . EYE SURGERY     bilateral cataracts with lens implants  . HIATAL HERNIA REPAIR    . HIP ARTHROPLASTY Left 08/22/2013   Procedure: ARTHROPLASTY BIPOLAR HIP;  Surgeon: Mauri Pole, MD;  Location: [facility];  Service: Orthopedics;  Laterality: Left;  . JOINT  REPLACEMENT     left hip  . KYPHOSIS SURGERY  2010 & 2012   x3  . Laparoscopic reduction of intrathoracic stomach and repair of hiatal hernia    . NISSEN FUNDOPLICATION    . PACEMAKER INSERTION  03/29/08   For SSS. SJM Zephyr XL DR Implanted by Dr. Leonia Reeves.  . TONSILLECTOMY    . VAGINAL HYSTERECTOMY     partial   HPI:  83 y.o.femalewith past medical history of atrial fibrillation, CKD, diabetes, heart failure, Nissen fundoplication surgery ([id number]?) admitted after fall, sustaining right hip fx. IM nailing 10/26. Esophagram: 05/31/14: Poor esophageal motility with numerous tertiary contractions in the redundant distal esophagus. 13 mm barium tablet did not pass through the gastroesophageal junction at the site of the Nissen fundoplication. This is the same result as during the esophagram of 12/20/2004; Endoscopy 06/29/14: balloon dilation.     Assessment / Plan / Recommendation Clinical Impression  Pt with no overt s/s of dysphagia with adequate attention and oral control, brisk swallow response, no coughing after consumption of water.  No current c/o esophageal symptoms.  Recommend full liquid diet; meds whole in puree.  Advance as tolerated.  Assist with tray set-up and encourage pt to maintain head in neutral position when eating.  D/W daughter, Therapist, sports.  No SLP f/u needed.  SLP Visit Diagnosis: Dysphagia, unspecified (R13.10)    Aspiration Risk  No limitations    Diet  Recommendation   full liquid diet - advance per RN as tolerated  Medication Administration: Whole meds with puree    Other  Recommendations Oral Care Recommendations: Oral care BID   Follow up Recommendations        Frequency and Duration            Prognosis        Swallow Study   General Date of Onset: 09/05/17 HPI: 83 y.o.femalewith past medical history of atrial fibrillation, CKD, diabetes, heart failure, Nissen fundoplication surgery ([id number]?) admitted after fall, sustaining right hip fx. IM nailing 10/26.  Esophagram: 05/31/14: Poor esophageal motility with numerous tertiary contractions in the redundant distal esophagus. 13 mm barium tablet did not pass through the gastroesophageal junction at the site of the Nissen fundoplication. This is the same result as during the esophagram of 12/20/2004; Endoscopy 06/29/14: balloon dilation.   Type of Study: Bedside Swallow Evaluation Diet Prior to this Study: NPO Temperature Spikes Noted: No Respiratory Status: Room air History of Recent Intubation: Yes Length of Intubations (days):  (for surgery only) Behavior/Cognition: Alert;Cooperative Oral Cavity Assessment: Dry Oral Care Completed by SLP: Yes Oral Cavity - Dentition: Missing dentition Vision: Functional for self-feeding Self-Feeding Abilities: Able to feed self Patient Positioning: Upright in bed Baseline Vocal Quality: Normal Volitional Cough: Strong Volitional Swallow: Able to elicit    Oral/Motor/Sensory Function Overall Oral Motor/Sensory Function: Within functional limits   Ice Chips Ice chips: Not tested   Thin Liquid Thin Liquid: Within functional limits    Nectar Thick Nectar Thick Liquid: Not tested   Honey Thick Honey Thick Liquid: Not tested   Puree Puree: Within functional limits   Solid   GO   Solid: Not tested        Juan Quam Laurice 09/06/2017,2:24 PM

## 2017-09-06 NOTE — Progress Notes (Signed)
PROGRESS NOTE    [name]  NWG:[id number] DOB: 06/20/1926 DOA: 09/05/2017 PCP: Mayra Neer, MD      Brief Narrative:  83 yo F with AF not on warfarin, hypothyoridism, HFpEF, and SSS with pacer who presents with RIGHT hip fracture.   Assessment & Plan:  Principal Problem:   Closed comminuted fracture of hip, right, initial encounter [facility]) Active Problems:   Chronic atrial fibrillation (HCC)   Hypothyroidism, acquired   Chronic diastolic CHF (congestive heart failure) (HCC)   Sick sinus syndrome (HCC)   Essential hypertension   Comminuted right hip fracture Repaired today.   -Per Ortho   Hyponatremia: Mild -Repeat BMP tomorrow and osmolalities  Hypertension -Restart losartan tomorrow pending BPs -Continue metoprolol and Cardizem -PRN for severe range pressures -Continue statin  Chronic atrial fibrillation CHADS2Vasc at least 5.  Not on warfarin due to diverticular hemorrhage. -Continue rate control  Hypothyroidism -Continue levothyroxine  Chronic diastolic CHF Euvolemic.  Last EF 55% in Jan 2018. -Resume furosemide, BB, ARB  Leukocytosis: Reactive  Other medications: -Continue Namenda        DVT prophylaxis: Lovenox Code Status: DO NOT RESUSCITATE, confirmed with daughter at bedisde Family Communication: Daughter Disposition Plan: PT and OT eval, then likely SNF in 2-3 days   Consultants:   Orthopedics  Procedures:   Intramedullary nailing of right hip  Antimicrobials:   Cefazolin x2  perioop   Subjective: Feeling better.  Oriented to hospital, yaer, daughter, situation.  Pain in right hip.  No CP, dyspnea.  Sleepy, a little out of it.  Objective: Vitals:   09/06/17 0716 09/06/17 1034 09/06/17 1049 09/06/17 1115  BP:  132/80 123/67 127/60  Pulse:    89  Resp:    18  Temp:  97.9 F (36.6 C)  98.5 F (36.9 C)  TempSrc:    Axillary  SpO2:  100% 97% 91%  Weight: 53.1 kg (117 lb)     Height: 5' (1.524 m)        Intake/Output Summary (Last 24 hours) at 09/06/17 1325 Last data filed at 09/06/17 1035  Gross per 24 hour  Intake          1466.67 ml  Output              650 ml  Net           816.67 ml   Filed Weights   09/06/17 0716  Weight: 53.1 kg (117 lb)    Examination: General appearance: Elderly adult female, awake, a little sedated still and in no acute distress.   HEENT: Anicteric, conjunctiva pink, lids and lashes normal. No nasal deformity, discharge, epistaxis.  Lips very dry.   Skin: Warm and dry.  No jaundice.  No suspicious rashes or lesions. Cardiac: RRR, nl S1-S2, no murmurs appreciated.  Capillary refill is brisk.  JVP normal.  No LE edema.  Radial pulses 2+ and symmetric. Respiratory: Normal respiratory rate and rhythm.  CTAB without rales or wheezes. MSK: Dressing on right hip Neuro: Awake, sleepy, but oriented.  Speech fluent.    Psych: Sensorium intact and responding to questions, attention diminished, sedated. Affect blunted.  Judgment and insight appear normal for age.    Data Reviewed: I have personally reviewed following labs and imaging studies:  CBC:  Recent Labs Lab 09/05/17 1007 09/06/17 1133  WBC 17.0* 13.5*  NEUTROABS 15.2*  --   HGB 13.0 12.3  HCT 38.0 35.5*  MCV 94.5 93.7  PLT 269 206   Basic  Metabolic Panel:  Recent Labs Lab 09/05/17 1007 09/06/17 1133  NA 130*  --   K 3.4*  --   CL 89*  --   CO2 28  --   GLUCOSE 206*  --   BUN 8  --   CREATININE 0.58 0.90  CALCIUM 9.1  --    GFR: Estimated Creatinine Clearance: 30.4 mL/min (by C-G formula based on SCr of 0.9 mg/dL). Liver Function Tests: No results for input(s): AST, ALT, ALKPHOS, BILITOT, PROT, ALBUMIN in the last 168 hours. No results for input(s): LIPASE, AMYLASE in the last 168 hours. No results for input(s): AMMONIA in the last 168 hours. Coagulation Profile:  Recent Labs Lab 09/05/17 1007  INR 0.96   Cardiac Enzymes: No results for input(s): CKTOTAL, CKMB,  CKMBINDEX, TROPONINI in the last 168 hours. BNP (last 3 results) No results for input(s): PROBNP in the last 8760 hours. HbA1C: No results for input(s): HGBA1C in the last 72 hours. CBG:  Recent Labs Lab 09/05/17 2312 09/06/17 0637 09/06/17 1101 09/06/17 1118  GLUCAP 182* 187* 144* 135*   Lipid Profile: No results for input(s): CHOL, HDL, LDLCALC, TRIG, CHOLHDL, LDLDIRECT in the last 72 hours. Thyroid Function Tests:  Recent Labs  09/05/17 1007  TSH 3.656   Anemia Panel: No results for input(s): VITAMINB12, FOLATE, FERRITIN, TIBC, IRON, RETICCTPCT in the last 72 hours. Urine analysis:    Component Value Date/Time   COLORURINE YELLOW 09/05/2017 1201   APPEARANCEUR CLOUDY (A) 09/05/2017 1201   LABSPEC 1.006 09/05/2017 1201   PHURINE 8.0 09/05/2017 1201   GLUCOSEU >=500 (A) 09/05/2017 1201   HGBUR SMALL (A) 09/05/2017 1201   BILIRUBINUR NEGATIVE 09/05/2017 1201   KETONESUR 5 (A) 09/05/2017 1201   PROTEINUR 100 (A) 09/05/2017 1201   UROBILINOGEN 0.2 08/21/2013 1615   NITRITE NEGATIVE 09/05/2017 1201   LEUKOCYTESUR TRACE (A) 09/05/2017 1201   Sepsis Labs: @LABRCNTIP (procalcitonin:4,lacticidven:4)  )No results found for this or any previous visit (from the past 240 hour(s)).       Radiology Studies: Dg Chest 1 View  Result Date: 09/05/2017 CLINICAL DATA:  Severe right hip pain.  Fall. EXAM: CHEST 1 VIEW COMPARISON:  11/13/2016 FINDINGS: Left pacer remains in place, unchanged. Cardiomegaly. Aortic atherosclerosis. No confluent airspace opacity or effusion. No acute bony abnormality. IMPRESSION: Cardiomegaly.  No active disease. Electronically Signed   By: Rolm Baptise M.D.   On: 09/05/2017 10:12   Dg Pelvis 1-2 Views  Result Date: 09/06/2017 CLINICAL DATA:  Right femur fracture fixation. EXAM: PELVIS - 1-2 VIEW COMPARISON:  Radiographs 09/05/2017 and 09/06/2017. FINDINGS: Status post recent right femoral intramedullary nail and dynamic screw fixation of a  comminuted right intertrochanteric fracture. The hardware is well positioned. Previous left hip hemiarthroplasty. No evidence of pelvic fracture or dislocation. There is some gas within the soft tissues around the right hip. IMPRESSION: No demonstrated complication following right femoral intramedullary nail placement. Electronically Signed   By: Richardean Sale M.D.   On: 09/06/2017 12:49   Dg Elbow Complete Right  Result Date: 09/05/2017 CLINICAL DATA:  Right elbow pain since a fall this morning. Initial encounter. EXAM: RIGHT ELBOW - COMPLETE 3+ VIEW COMPARISON:  None. FINDINGS: No acute bony or joint abnormality is identified. No joint effusion. Soft tissues posterior to the proximal ulna appear swollen compatible with hematoma. IMPRESSION: No acute bony abnormality. Likely hematoma posterior to the proximal ulna. Electronically Signed   By: Inge Rise M.D.   On: 09/05/2017 10:12   Dg Knee 1-2  Views Right  Result Date: 09/05/2017 CLINICAL DATA:  [place] this morning. EXAM: RIGHT KNEE - 1-2 VIEW COMPARISON:  None. FINDINGS: No acute fractures identified. No osteochondral lesion or joint effusion. Mild-to-moderate tricompartmental degenerative changes. IMPRESSION: No acute bony findings. Electronically Signed   By: Marijo Sanes M.D.   On: 09/05/2017 12:49   Ct Hip Right Wo Contrast  Result Date: 09/05/2017 CLINICAL DATA:  Right femur fracture. EXAM: CT OF THE RIGHT HIP WITHOUT CONTRAST TECHNIQUE: Multidetector CT imaging of the right hip was performed according to the standard protocol. Multiplanar CT image reconstructions were also generated. COMPARISON:  Right hip x-rays from same date. CT abdomen pelvis dated November 13, 2016. FINDINGS: Bones/Joint/Cartilage Again seen is a comminuted, impacted, and displaced predominantly intertrochanteric femur fracture with extension into the basicervical femoral neck. The dominant distal fragment is displaced anteriorly by 1 bone shaft width, while the  intertrochanteric fragment is displaced posteriorly by 1 bone shaft width. There is apex anterior angulation. No additional fracture is seen. No dislocation. Ligaments Suboptimally assessed by CT. Muscles and Tendons No focal abnormality. Soft tissues Soft tissue stranding over the right greater trochanter. Hematoma surrounding the fracture fragments. The bladder is prominently distended. IMPRESSION: 1. Comminuted, impacted, and displaced intertrochanteric and basicervical proximal femur fracture. Electronically Signed   By: Titus Dubin M.D.   On: 09/05/2017 12:45   Dg C-arm 61-120 Min  Result Date: 09/06/2017 CLINICAL DATA:  Right femoral internal fixation EXAM: DG C-ARM 61-120 MIN; RIGHT FEMUR 2 VIEWS COMPARISON:  Plain films 09/05/2017 FINDINGS: Multiple intraoperative spot images demonstrate intramedullary nail placement and dynamic hip screw across the right femoral intertrochanteric fracture. Near anatomic alignment. No hardware bony complicating feature. Remote changes of left hip replacement. IMPRESSION: Internal fixation across the right femoral intertrochanteric fracture with anatomic alignment and no visible complicating feature. Electronically Signed   By: Rolm Baptise M.D.   On: 09/06/2017 10:53   Dg Hip Unilat  With Pelvis 2-3 Views Right  Result Date: 09/05/2017 CLINICAL DATA:  Recent trip and fall with severe right hip pain EXAM: DG HIP (WITH OR WITHOUT PELVIS) 2-3V RIGHT COMPARISON:  None. FINDINGS: Left hip replacement is noted. The pelvic ring is intact. A comminuted proximal right femoral fracture is noted involving primarily the intratrochanteric region with some extension into the proximal metaphysis. Impaction at the fracture site is noted. Some posterior angulation of the distal fracture fragment is noted as well. IMPRESSION: Comminuted intratrochanteric fracture with impaction and angulation at the fracture site. Electronically Signed   By: Inez Catalina M.D.   On: 09/05/2017  10:14   Dg Femur, Min 2 Views Right  Result Date: 09/06/2017 CLINICAL DATA:  Right femoral internal fixation EXAM: DG C-ARM 61-120 MIN; RIGHT FEMUR 2 VIEWS COMPARISON:  Plain films 09/05/2017 FINDINGS: Multiple intraoperative spot images demonstrate intramedullary nail placement and dynamic hip screw across the right femoral intertrochanteric fracture. Near anatomic alignment. No hardware bony complicating feature. Remote changes of left hip replacement. IMPRESSION: Internal fixation across the right femoral intertrochanteric fracture with anatomic alignment and no visible complicating feature. Electronically Signed   By: Rolm Baptise M.D.   On: 09/06/2017 10:53   Dg Femur Port, Min 2 Views Right  Result Date: 09/06/2017 CLINICAL DATA:  Right femur fracture post ORIF. EXAM: RIGHT FEMUR PORTABLE 2 VIEW COMPARISON:  Intraoperative radiograph same date. Additional radiographs 09/05/2017. FINDINGS: Right femoral intramedullary nail and dynamic screw appear well positioned. There is a distal interlocking screw. There is improved alignment of the intertrochanteric  femur fracture. The femoral head is located. There is no evidence of pelvic fracture. There is some gas in the soft tissue surrounding the proximal femur. IMPRESSION: Improved alignment of intertrochanteric right femur fracture status post finding nail fixation. No demonstrated complication. Electronically Signed   By: Richardean Sale M.D.   On: 09/06/2017 12:48        Scheduled Meds: . diltiazem  120 mg Oral Daily  . docusate sodium  100 mg Oral BID  . [START ON 09/07/2017] enoxaparin (LOVENOX) injection  40 mg Subcutaneous Q24H  . furosemide  20 mg Oral Daily  . insulin aspart  0-9 Units Subcutaneous TID WC  . levothyroxine  50 mcg Oral QAC breakfast  . memantine  10 mg Oral BID  . metoprolol  200 mg Oral q morning - 10a  . pravastatin  40 mg Oral Daily   Continuous Infusions: . ceFAZolin    .  ceFAZolin (ANCEF) IV    . dextrose  5 % and 0.45 % NaCl with KCl 20 mEq/L 100 mL/hr at 09/06/17 1128  . lactated ringers       LOS: 1 day    Time spent: 25 minutes    Edwin Dada, MD [facility] Pager [phone]  If 7PM-7AM, please contact night-coverage [email or web address] Password [email or web address] 09/06/2017, 1:25 PM

## 2017-09-06 NOTE — Consult Note (Signed)
Orthopaedic Trauma Service (OTS) Consult   Patient ID: [name] MRN: [id number] DOB/AGE: October 04, 1926 83 y.o.   Reason for Consult: Comminuted right hip fracture Referring Physician: Benjaman Lobe, MD (Fam Med)   HPI: [name] is an 83 y.o. white female with significant medical history including multiple low energy fractures who sustained a ground-level fall yesterday morning while at home.  Patient lives in a house with her son.  Daughter lives next door.  Patient was ambulating to the bathroom when she fell.  Patient landed on her right hip.  Had immediate onset of pain and inability to bear weight.  She was brought to [facility] long hospital where she was found to have a comminuted right peritrochanteric hip fracture.  On-call orthopedics was contacted by Elvina Sidle.  They felt that the fracture pattern was too complex and requested evaluation and treatment by the orthopedic trauma service.  Patient was transferred to [facility] for definitive treatment.  She was ultimately transferred over to [facility] approximately midnight. Patient is hard of hearing.  Daughter is at bedside.  Patient does use a walker at baseline however at the time of her fall she was using a cane.  There is also some history of diabetes however she is not on diabetes medications at current time.  Her urinalysis on admission shows greater than 500 mg/dL of glucose as well as proteinuria.  There is also history of vitamin D deficiency, CKD and thyroid dysfunction  Past Medical History:  Diagnosis Date  . Afib ([place])    No Coumadin secondary to history of GI bleed.Marland Kitchen ECHO 12/04/11 LVEF estimated by 2D at 60-65%.  . Allergy   . Chronic hyponatremia   . CKD (chronic kidney disease) stage 2, GFR 60-89 ml/min   . Diabetes mellitus with renal manifestation ([place])   . Diastolic dysfunction   . Diverticulosis of colon with hemorrhage   . Esophageal reflux   . Fracture of T11 vertebra ([place]) 2010   Compression fracture   . GERD (gastroesophageal reflux disease)    s/p Nissen Repair, Dr. Hassell Done  . History of GI diverticular bleed 11/2011   ASA stopped  . Hyperlipidemia   . Hypertension   . Hypothyroidism   . Iron deficiency anemia secondary to blood loss (chronic)   . Mixed hyperlipidemia   . Osteoarthritis   . Sick sinus syndrome ([facility])    s/p PPM  . Skin cancer of nose    Removed by derm  . Unspecified vitamin D deficiency     Past Surgical History:  Procedure Laterality Date  . BALLOON DILATION N/A 06/29/2014   Procedure: BALLOON DILATION;  Surgeon: Garlan Fair, MD;  Location: [facility];  Service: Endoscopy;  Laterality: N/A;  . CYSTOCELE REPAIR     x 2  . ESOPHAGOGASTRODUODENOSCOPY (EGD) WITH PROPOFOL N/A 06/29/2014   Procedure: ESOPHAGOGASTRODUODENOSCOPY (EGD) WITH PROPOFOL;  Surgeon: Garlan Fair, MD;  Location: [facility];  Service: Endoscopy;  Laterality: N/A;  . EYE SURGERY     bilateral cataracts with lens implants  . HIATAL HERNIA REPAIR    . HIP ARTHROPLASTY Left 08/22/2013   Procedure: ARTHROPLASTY BIPOLAR HIP;  Surgeon: Mauri Pole, MD;  Location: [facility];  Service: Orthopedics;  Laterality: Left;  . JOINT REPLACEMENT     left hip  . KYPHOSIS SURGERY  2010 & 2012   x3  . Laparoscopic reduction of intrathoracic stomach and repair of hiatal hernia    . NISSEN FUNDOPLICATION    .  PACEMAKER INSERTION  03/29/08   For SSS. SJM Zephyr XL DR Implanted by Dr. Leonia Reeves.  . TONSILLECTOMY    . VAGINAL HYSTERECTOMY     partial    Family History  Problem Relation Age of Onset  . Colon cancer Mother   . CAD Father   . CAD Sister     Social History:  reports that she quit smoking about 38 years ago. Her smoking use included Cigarettes. She has never used smokeless tobacco. She reports that she does not drink alcohol or use drugs.  Allergies:  Allergies  Allergen Reactions  . Ace Inhibitors Cough  . Aspirin Nausea Only  . Sulfa Drugs Cross Reactors Nausea Only  .  Atorvastatin Other (See Comments)    Leg swelling  . Welchol [Colesevelam Hcl] Hives    Medications:  Current Meds  Medication Sig  . Calcium Carbonate-Vitamin D (CALCIUM 600 + D PO) Take 1 tablet by mouth daily.  . cetirizine (ZYRTEC) 10 MG tablet Take 10 mg by mouth daily as needed for allergies.   Marland Kitchen diltiazem (CARDIZEM CD) 120 MG 24 hr capsule Take 1 capsule (120 mg total) by mouth daily.  . furosemide (LASIX) 40 MG tablet TAKE 1 TABLET BY MOUTH QOD AND TAKE 1/2 TABLET ON ALTERNATING DAYS (Patient taking differently: Take 20 mg by mouth daily. )  . gabapentin (NEURONTIN) 300 MG capsule Take 300 mg by mouth at bedtime.   Marland Kitchen HYDROcodone-acetaminophen (NORCO) 10-325 MG per tablet Take 1 tablet by mouth every 4 (four) hours as needed for moderate pain. Back pain  . levothyroxine (SYNTHROID, LEVOTHROID) 50 MCG tablet Take 50 mcg by mouth daily before breakfast.  . losartan (COZAAR) 100 MG tablet Take 100 mg by mouth every morning.  . memantine (NAMENDA) 5 MG tablet Take 10 mg by mouth 2 (two) times daily.  . metoprolol (TOPROL-XL) 200 MG 24 hr tablet Take 200 mg by mouth every morning.   . polyvinyl alcohol (LIQUIFILM TEARS) 1.4 % ophthalmic solution Place 1 drop into both eyes every 4 (four) hours as needed for dry eyes.  . pravastatin (PRAVACHOL) 40 MG tablet Take 40 mg by mouth daily.  . sodium chloride (OCEAN) 0.65 % SOLN nasal spray Place 1 spray into both nostrils every 4 (four) hours as needed for congestion.     Results for orders placed or performed during the hospital encounter of 09/05/17 (from the past 48 hour(s))  Basic metabolic panel     Status: Abnormal   Collection Time: 09/05/17 10:07 AM  Result Value Ref Range   Sodium 130 (L) 135 - 145 mmol/L   Potassium 3.4 (L) 3.5 - 5.1 mmol/L   Chloride 89 (L) 101 - 111 mmol/L   CO2 28 22 - 32 mmol/L   Glucose, Bld 206 (H) 65 - 99 mg/dL   BUN 8 6 - 20 mg/dL   Creatinine, Ser 0.58 0.44 - 1.00 mg/dL   Calcium 9.1 8.9 - 10.3 mg/dL    GFR calc non Af Amer >60 >60 mL/min   GFR calc Af Amer >60 >60 mL/min    Comment: (NOTE) The eGFR has been calculated using the CKD EPI equation. This calculation has not been validated in all clinical situations. eGFR's persistently <60 mL/min signify possible Chronic Kidney Disease.    Anion gap 13 5 - 15  CBC WITH DIFFERENTIAL     Status: Abnormal   Collection Time: 09/05/17 10:07 AM  Result Value Ref Range   WBC 17.0 (H) 4.0 - 10.5  K/uL   RBC 4.02 3.87 - 5.11 MIL/uL   Hemoglobin 13.0 12.0 - 15.0 g/dL   HCT 38.0 36.0 - 46.0 %   MCV 94.5 78.0 - 100.0 fL   MCH 32.3 26.0 - 34.0 pg   MCHC 34.2 30.0 - 36.0 g/dL   RDW 13.1 11.5 - 15.5 %   Platelets 269 150 - 400 K/uL   Neutrophils Relative % 89 %   Neutro Abs 15.2 (H) 1.7 - 7.7 K/uL   Lymphocytes Relative 7 %   Lymphs Abs 1.1 0.7 - 4.0 K/uL   Monocytes Relative 4 %   Monocytes Absolute 0.7 0.1 - 1.0 K/uL   Eosinophils Relative 0 %   Eosinophils Absolute 0.0 0.0 - 0.7 K/uL   Basophils Relative 0 %   Basophils Absolute 0.0 0.0 - 0.1 K/uL  Protime-INR     Status: None   Collection Time: 09/05/17 10:07 AM  Result Value Ref Range   Prothrombin Time 12.7 11.4 - 15.2 seconds   INR 0.96   TSH     Status: None   Collection Time: 09/05/17 10:07 AM  Result Value Ref Range   TSH 3.656 0.350 - 4.500 uIU/mL    Comment: Performed by a 3rd Generation assay with a functional sensitivity of <=0.01 uIU/mL.  Type and screen [place]     Status: None   Collection Time: 09/05/17 10:10 AM  Result Value Ref Range   ABO/RH(D) B POS    Antibody Screen NEG    Sample Expiration 09/08/2017    Weak D POS   Urinalysis, Routine w reflex microscopic     Status: Abnormal   Collection Time: 09/05/17 12:01 PM  Result Value Ref Range   Color, Urine YELLOW YELLOW   APPearance CLOUDY (A) CLEAR   Specific Gravity, Urine 1.006 1.005 - 1.030   pH 8.0 5.0 - 8.0   Glucose, UA >=500 (A) NEGATIVE mg/dL   Hgb urine dipstick SMALL (A)  NEGATIVE   Bilirubin Urine NEGATIVE NEGATIVE   Ketones, ur 5 (A) NEGATIVE mg/dL   Protein, ur 100 (A) NEGATIVE mg/dL   Nitrite NEGATIVE NEGATIVE   Leukocytes, UA TRACE (A) NEGATIVE   RBC / HPF 0-5 0 - 5 RBC/hpf   WBC, UA 6-30 0 - 5 WBC/hpf   Bacteria, UA RARE (A) NONE SEEN   Squamous Epithelial / LPF NONE SEEN NONE SEEN  Glucose, capillary     Status: Abnormal   Collection Time: 09/05/17 11:12 PM  Result Value Ref Range   Glucose-Capillary 182 (H) 65 - 99 mg/dL  Glucose, capillary     Status: Abnormal   Collection Time: 09/06/17  6:37 AM  Result Value Ref Range   Glucose-Capillary 187 (H) 65 - 99 mg/dL    Dg Chest 1 View  Result Date: 09/05/2017 CLINICAL DATA:  Severe right hip pain.  Fall. EXAM: CHEST 1 VIEW COMPARISON:  11/13/2016 FINDINGS: Left pacer remains in place, unchanged. Cardiomegaly. Aortic atherosclerosis. No confluent airspace opacity or effusion. No acute bony abnormality. IMPRESSION: Cardiomegaly.  No active disease. Electronically Signed   By: Rolm Baptise M.D.   On: 09/05/2017 10:12   Dg Elbow Complete Right  Result Date: 09/05/2017 CLINICAL DATA:  Right elbow pain since a fall this morning. Initial encounter. EXAM: RIGHT ELBOW - COMPLETE 3+ VIEW COMPARISON:  None. FINDINGS: No acute bony or joint abnormality is identified. No joint effusion. Soft tissues posterior to the proximal ulna appear swollen compatible with hematoma. IMPRESSION: No acute bony  abnormality. Likely hematoma posterior to the proximal ulna. Electronically Signed   By: Inge Rise M.D.   On: 09/05/2017 10:12   Dg Knee 1-2 Views Right  Result Date: 09/05/2017 CLINICAL DATA:  [place] this morning. EXAM: RIGHT KNEE - 1-2 VIEW COMPARISON:  None. FINDINGS: No acute fractures identified. No osteochondral lesion or joint effusion. Mild-to-moderate tricompartmental degenerative changes. IMPRESSION: No acute bony findings. Electronically Signed   By: Marijo Sanes M.D.   On: 09/05/2017 12:49   Ct Hip  Right Wo Contrast  Result Date: 09/05/2017 CLINICAL DATA:  Right femur fracture. EXAM: CT OF THE RIGHT HIP WITHOUT CONTRAST TECHNIQUE: Multidetector CT imaging of the right hip was performed according to the standard protocol. Multiplanar CT image reconstructions were also generated. COMPARISON:  Right hip x-rays from same date. CT abdomen pelvis dated November 13, 2016. FINDINGS: Bones/Joint/Cartilage Again seen is a comminuted, impacted, and displaced predominantly intertrochanteric femur fracture with extension into the basicervical femoral neck. The dominant distal fragment is displaced anteriorly by 1 bone shaft width, while the intertrochanteric fragment is displaced posteriorly by 1 bone shaft width. There is apex anterior angulation. No additional fracture is seen. No dislocation. Ligaments Suboptimally assessed by CT. Muscles and Tendons No focal abnormality. Soft tissues Soft tissue stranding over the right greater trochanter. Hematoma surrounding the fracture fragments. The bladder is prominently distended. IMPRESSION: 1. Comminuted, impacted, and displaced intertrochanteric and basicervical proximal femur fracture. Electronically Signed   By: Titus Dubin M.D.   On: 09/05/2017 12:45   Dg Hip Unilat  With Pelvis 2-3 Views Right  Result Date: 09/05/2017 CLINICAL DATA:  Recent trip and fall with severe right hip pain EXAM: DG HIP (WITH OR WITHOUT PELVIS) 2-3V RIGHT COMPARISON:  None. FINDINGS: Left hip replacement is noted. The pelvic ring is intact. A comminuted proximal right femoral fracture is noted involving primarily the intratrochanteric region with some extension into the proximal metaphysis. Impaction at the fracture site is noted. Some posterior angulation of the distal fracture fragment is noted as well. IMPRESSION: Comminuted intratrochanteric fracture with impaction and angulation at the fracture site. Electronically Signed   By: Inez Catalina M.D.   On: 09/05/2017 10:14    Review of  Systems  Constitutional: Negative for chills and fever.  Respiratory: Negative for shortness of breath and wheezing.   Cardiovascular: Negative for chest pain and palpitations.  Gastrointestinal: Negative for abdominal pain, nausea and vomiting.   Blood pressure (!) 149/64, pulse (!) 102, temperature 98.2 F (36.8 C), temperature source Oral, resp. rate 16, height 5' (1.524 m), weight 53.1 kg (117 lb), SpO2 97 %. Physical Exam  Constitutional: She is cooperative.  Elderly white female Hard of hearing   Cardiovascular: Regular rhythm, S1 normal and S2 normal.   Pulmonary/Chest:  Clear anterior fields  Abdominal:  Soft, nontender, nondistended, + bowel sounds  Musculoskeletal:  Right lower extremity Inspection: Right leg is shortened and externally rotated and abducted Bony eval: + Tenderness with palpation of the hip Knee is nontender, lower leg and ankle are nontender Soft tissue: No significant bruising or open wounds noted over the right hip or thigh Patient has significant skin changes to her lower extremities consistent with diabetes and vascular disease.  These are noted bilaterally  Sensation: DPN, SPN, TN sensory function grossly intact  Motor: EHL, FHL, lesser toe motor function are intact.  Anterior tibialis, posterior tibialis, peroneals, gastroc motor functions are grossly intact Vascular: No significant swelling distally + DP pulse Extremity is cool  Bilateral  upper extremities and left lower extremity Free of acute deformities Motor and sensory functions are grossly intact  no blocks to motion       Assessment/Plan:  83 year old female ground-level fall with highly comminuted right peritrochanteric hip fracture  -R peritrochanteric hip fracture   OR for intramedullary nailing  Touchdown weightbearing postoperatively  No restrictions in terms of range of motion postoperatively  PT and OT evaluations   - Pain management:  Minimize narcotics   -  ABL anemia/Hemodynamics  Monitor  - Medical issues   Per medical service   Diabetes    Will check hemoglobin A1c given urinalysis as well as serum glucose   Metabolic bone workup  Patient has evidence of osteoporosis given numerous vertebral compression fractures as well as this low energy hip fracture  - DVT/PE prophylaxis:  Lovenox postoperatively  for 4 weeks  - ID:   Perioperative antibiotics   Will order a urine culture postoperatively as well  - Metabolic Bone Disease:  Check labs   - Dispo:  OR for intramedullary nailing right hip   Jari Pigg, PA-C [facility] [phone] (C) [phone] (O) 09/06/2017, 8:14 AM   I saw and examined the patient with Mr. Eddie Dibbles, communicating the findings and plan noted above. We ordered and directly evaluated the films and labs. Daughter at bedside for discussion. Dr. Glennon Mac from anesthesia participating and coordinating with regard to pacer, as well. Dr. Lyla Glassing spoke with [place] yesterday regarding technical difficulty of this fracture repair.  Complex patient with severe fracture, osteoporosis, multiple medical problems and need for further urologic work up given glucose and proteinuria, and also of her DM with Hgb A1c, which may result in further adjustments by [facility] team. Patient was taken off her metformin and currently glucose checks have shown low 100's or below except one 206 so would want to be careful to avoid hypoglycemia.   I discussed with the patient and her daughter the risks and benefits of surgery for repair of her right comminuted hip fracture, including the possibility of heart attack, stroke, infection, nerve injury, vessel injury, wound breakdown, arthritis, symptomatic hardware, DVT/ PE, loss of motion, malunion, nonunion, and need for further surgery among others.  She and her daughter acknowledged these risks and wished to proceed.   Angelica [place], MD [facility] [phone] (p)

## 2017-09-06 NOTE — Anesthesia Preprocedure Evaluation (Signed)
Anesthesia Evaluation   Patient awake    Reviewed: Allergy & Precautions, NPO status , Patient's Chart, lab work & pertinent test results, reviewed documented beta blocker date and time   History of Anesthesia Complications Negative for: history of anesthetic complications  Airway Mallampati: II  TM Distance: >3 FB Neck ROM: Full    Dental  (+) Edentulous Upper, Edentulous Lower   Pulmonary former smoker,    breath sounds clear to auscultation       Cardiovascular hypertension, Pt. on medications and Pt. on home beta blockers (-) angina+ dysrhythmias Atrial Fibrillation + pacemaker (for SSS)  Rhythm:Irregular Rate:Tachycardia  1/18 ECHO: atrial fibrillation. Normal LV size with   moderate LV hypertrophy. EF 55-60%. Mild AI, Moderate LAE. Moderate pulmonary HTN. Normal RV size with mildly decreased systolic function   Neuro/Psych negative neurological ROS     GI/Hepatic GERD  Medicated and Controlled,  Endo/Other  diabetes (glu 187)Hypothyroidism   Renal/GU Renal InsufficiencyRenal disease     Musculoskeletal   Abdominal   Peds  Hematology   Anesthesia Other Findings   Reproductive/Obstetrics                             Anesthesia Physical Anesthesia Plan  ASA: III  Anesthesia Plan: General   Post-op Pain Management:    Induction: Intravenous  PONV Risk Score and Plan: 3 and Ondansetron, Dexamethasone and Treatment may vary due to age or medical condition  Airway Management Planned: Oral ETT  Additional Equipment:   Intra-op Plan:   Post-operative Plan: Extubation in OR  Informed Consent: I have reviewed the patients History and Physical, chart, labs and discussed the procedure including the risks, benefits and alternatives for the proposed anesthesia with the patient or authorized representative who has indicated his/her understanding and acceptance.   Dental advisory given  and Consent reviewed with POA  Plan Discussed with: Surgeon and CRNA  Anesthesia Plan Comments: (Plan routine monitors, GETA)        Anesthesia Quick Evaluation

## 2017-09-06 NOTE — Anesthesia Procedure Notes (Signed)
Procedure Name: Intubation Date/Time: 09/06/2017 8:32 AM Performed by: Orlie Dakin Pre-anesthesia Checklist: Patient identified, Emergency Drugs available, Suction available, Patient being monitored and Timeout performed Patient Re-evaluated:Patient Re-evaluated prior to induction Oxygen Delivery Method: Circle system utilized Preoxygenation: Pre-oxygenation with 100% oxygen Induction Type: IV induction Ventilation: Mask ventilation without difficulty Laryngoscope Size: Miller and 3 Grade View: Grade I Tube type: Oral Tube size: 7.0 mm Number of attempts: 1 Airway Equipment and Method: Stylet Placement Confirmation: ETT inserted through vocal cords under direct vision,  positive ETCO2 and breath sounds checked- equal and bilateral Secured at: 23 cm Tube secured with: Tape Dental Injury: Teeth and Oropharynx as per pre-operative assessment

## 2017-09-06 NOTE — Transfer of Care (Signed)
Immediate Anesthesia Transfer of Care Note  Patient: [name]  Procedure(s) Performed: INTRAMEDULLARY (IM) NAIL INTERTROCHANTRIC (Right )  Patient Location: PACU  Anesthesia Type:General  Level of Consciousness: drowsy and patient cooperative  Airway & Oxygen Therapy: Patient Spontanous Breathing and Patient connected to face mask oxygen  Post-op Assessment: Report given to RN and Post -op Vital signs reviewed and stable  Post vital signs: Reviewed and stable  Last Vitals:  Vitals:   09/05/17 2306 09/06/17 0641  BP: 128/71 (!) 149/64  Pulse: 92 (!) 102  Resp: 16 16  Temp: 36.7 C 36.8 C  SpO2: 97% 97%    Last Pain:  Vitals:   09/06/17 0641  TempSrc: Oral  PainSc:          Complications: No apparent anesthesia complications

## 2017-09-06 NOTE — Progress Notes (Signed)
I have seen and examined the patient. I agree with Mr. Sammuel Hines nearby findings above.  Right peritroch comminuted hip fracture for IM nailing.  I discussed with the patient and her daughterthe risks and benefits of surgery, including the possibility of infection, limb length inequality, nerve injury, vessel injury, wound breakdown, arthritis, symptomatic hardware, DVT/ PE, loss of motion, malunion, nonunion, and need for further surgery among others. She acknowledged these risks and wished to proceed.    Rozanna Box, MD 09/06/2017 8:05 AM

## 2017-09-06 NOTE — Progress Notes (Signed)
Orthopedic Tech Progress Note Patient Details:  [name] 1926-09-05 [id number]  Patient ID: [name], female   DOB: 27-Apr-1926, 83 y.o.   MRN: [id number]   [place] Pink 09/06/2017, 3:47 PMUnable to use Trapeze bar.

## 2017-09-06 NOTE — Op Note (Signed)
09/05/2017 - 09/06/2017  4:34 PM  PATIENT:  [name]  83 y.o. female  PRE-OPERATIVE DIAGNOSIS:  RIGHT COMMINUTED PERITRONCHANTERIC FEMUR FRACTURE  POST-OPERATIVE DIAGNOSIS:  RIGHT COMMINUTED PERITRONCHANTERIC FEMUR FRACTURE  PROCEDURE:  Procedure(s): INTRAMEDULLARY (IM) NAIL INTERTROCHANTRIC (Right) WITH BIOMET AFFIXUS 44mm x 363mm  SURGEON:  Surgeon(s) and Role:    Altamese Dent, MD - Primary  PHYSICIAN ASSISTANT: Ainsley Spinner, PA-C  ANESTHESIA:   general  EBL:  100 mL   BLOOD ADMINISTERED:none  DRAINS: none   LOCAL MEDICATIONS USED:  NONE  SPECIMEN:  No Specimen  DISPOSITION OF SPECIMEN:  N/A  COUNTS:  YES  TOURNIQUET:  * No tourniquets in log *  DICTATION: .Note written in EPIC  PLAN OF CARE: Admit to inpatient   PATIENT DISPOSITION:  PACU - hemodynamically stable.   Delay start of Pharmacological VTE agent (>24hrs) due to surgical blood loss or risk of bleeding: no  PROCEDURE:  Intramedullary nailing of the right hip using a Biomet Affixus nail.  SURGEON:  Astrid Divine. Marcelino Scot, M.D.  ASSISTANT:  Ainsley Spinner, PA-C.  ANESTHESIA:  General.  COMPLICATIONS:  None.  ESTIMATED BLOOD LOSS:  Less than 150 mL.  DISPOSITION:  To PACU.  CONDITION:  Stable.  BRIEF SUMMARY AND INDICATION OF PROCEDURE:  [name] is a 83 y.o. year- old with multiple medical problems.  I discussed with the patient and family risks and benefits of surgical treatment including the potential for malunion, nonunion, symptomatic hardware, heart attack, stroke, neurovascular injury, bleeding, and others.  After full discussion, the patient and family wished to proceed.  BRIEF SUMMARY OF PROCEDURE:  The patient was taken to the operating room where general anesthesia was induced.  She was positioned supine on the Hana fracture table.  A closed reduction maneuver was performed of the fractured proximal femur and this was confirmed on both AP and lateral xray views. A thorough  scrub and wash with chlorhexidine and then Betadine scrub and paint was performed.  After sterile drapes and time-out, a long instrument was used to identify the appropriate starting position under C-arm on both AP and lateral images.  A 3 cm incision was made proximal to the greater trochanter.  The curved cannulated awl was inserted just medial to the tip of the lateral trochanter and then the starting guidewire advanced into the proximal femur.  This was checked on AP and lateral views.  The starting reamer was engaged with the soft tissue protected by a sleeve.  The curved ball-tipped guidewire was then inserted, making sure it was just posterior as possible in the distal femur and across the fracture site, which stayed in a reduced position.  It was sequentially reamed up to 13 and an 11 mm nail inserted to the appropriate depth.  The guidewire for the lag screw was then inserted with the appropriate anteversion to make sure it was in a center-center position.  This was measured and the lag screw placed with excellent purchase and position checked on both views. Traction was released and compression achieved with the screw.  This was followed by placement of one distal locking screw using perfect circle technique.  This was confirmed on AP and lateral images. Wounds were irrigated thoroughly, closed in a standard layered fashion. Sterile gently compressive dressings were applied.  Ainsley Spinner, PA-C, assisted throughout.  The patient was awakened from anesthesia and transported to the PACU in stable condition.  PROGNOSIS:  The patient will be weightbearing as tolerated with physical  therapy beginning DVT prophylaxis as soon as deemed stable by the Primary Care Service.  She has no range of motion precautions.  We will continue to follow throughout the hospital stay.  Anticipate follow up after discharge in the office in 2 weeks for removal of sutures and further  evaluation.     Astrid Divine. Marcelino Scot, M.D.

## 2017-09-06 NOTE — Anesthesia Postprocedure Evaluation (Signed)
Anesthesia Post Note  Patient: [name]  Procedure(s) Performed: INTRAMEDULLARY (IM) NAIL INTERTROCHANTRIC (Right )     Patient location during evaluation: PACU Anesthesia Type: General Level of consciousness: awake and alert and patient cooperative Pain management: pain level controlled Vital Signs Assessment: post-procedure vital signs reviewed and stable Respiratory status: spontaneous breathing, nonlabored ventilation, respiratory function stable and patient connected to nasal cannula oxygen Cardiovascular status: blood pressure returned to baseline and stable Postop Assessment: no apparent nausea or vomiting Anesthetic complications: no    Last Vitals:  Vitals:   09/06/17 1049 09/06/17 1115  BP: 123/67 127/60  Pulse:  89  Resp:  18  Temp:  36.9 C  SpO2: 97% 91%    Last Pain:  Vitals:   09/06/17 1115  TempSrc: Axillary  PainSc:                  Eliazer Hemphill,E. Constant [place]

## 2017-09-07 LAB — GLUCOSE, CAPILLARY
GLUCOSE-CAPILLARY: 140 mg/dL — AB (ref 65–99)
GLUCOSE-CAPILLARY: 125 mg/dL — AB (ref 65–99)
Glucose-Capillary: 160 mg/dL — ABNORMAL HIGH (ref 65–99)
Glucose-Capillary: 103 mg/dL — ABNORMAL HIGH (ref 65–99)

## 2017-09-07 LAB — HEMOGLOBIN A1C
Hgb A1c MFr Bld: 6.1 % — ABNORMAL HIGH (ref 4.8–5.6)
MEAN PLASMA GLUCOSE: 128.37 mg/dL

## 2017-09-07 LAB — OSMOLALITY: OSMOLALITY: 269 mosm/kg — AB (ref 275–295)

## 2017-09-07 LAB — PHOSPHORUS: PHOSPHORUS: 2.6 mg/dL (ref 2.5–4.6)

## 2017-09-07 LAB — PREALBUMIN: Prealbumin: 15.6 mg/dL — ABNORMAL LOW (ref 18–38)

## 2017-09-07 LAB — MAGNESIUM: MAGNESIUM: 1.5 mg/dL — AB (ref 1.7–2.4)

## 2017-09-07 MED ORDER — LOSARTAN POTASSIUM 50 MG PO TABS
100.0000 mg | ORAL_TABLET | Freq: Every day | ORAL | Status: DC
  Administered 2017-09-07 – 2017-09-10 (×4): 100 mg via ORAL
  Filled 2017-09-07 (×4): qty 2

## 2017-09-07 MED ORDER — BISACODYL 10 MG RE SUPP
10.0000 mg | Freq: Every day | RECTAL | Status: DC | PRN
  Administered 2017-09-07: 10 mg via RECTAL
  Filled 2017-09-07: qty 1

## 2017-09-07 NOTE — Progress Notes (Signed)
PROGRESS NOTE    CATHI HAZAN  EML:[id number] DOB: 26-Jan-1926 DOA: 09/05/2017 PCP: Mayra Neer, MD      Brief Narrative:  83 yo F with AF not on warfarin, hypothyoridism, HFpEF, and SSS with pacer who presents with RIGHT hip fracture.   Assessment & Plan:  Principal Problem:   Closed comminuted fracture of hip, right, initial encounter [facility]) Active Problems:   Chronic atrial fibrillation (HCC)   Hypothyroidism, acquired   Chronic diastolic CHF (congestive heart failure) (HCC)   Sick sinus syndrome ([place])   Essential hypertension   Comminuted right hip fracture Repaired today.   -Per Ortho   Hyponatremia: Mild, labs c/w ADH secr.  Appears euvolemic.  Hypertension -Continue losartan, metop, Cardizem -PRN for severe range pressures -Continue statin  Chronic atrial fibrillation CHADS2Vasc at least 5.  Not on warfarin due to diverticular hemorrhage. -Continue rate control  Hypothyroidism -Continue levothyroxine  Chronic diastolic CHF Euvolemic.  Last EF 55% in Jan 2018. -Continue furosemide, BB, ARB  Leukocytosis: Reactive  Other medications: -Continue Namenda        DVT prophylaxis: Lovenox Code Status: DO NOT RESUSCITATE, confirmed with daughter at bedisde Family Communication: Daughter Disposition Plan: PT and OT eval, then likely SNF tomorrow or next day   Consultants:   Orthopedics  Procedures:   Intramedullary nailing of right hip  Antimicrobials:   Cefazolin x2  perioop   Subjective: Tired, feeling well.  Pain in right hip with movemetn.  No other complaints.  Objective: Vitals:   09/06/17 1115 09/06/17 1406 09/06/17 2242 09/07/17 0629  BP: 127/60 117/82 (!) 139/93 (!) 148/67  Pulse: 89 84 (!) 101 73  Resp: 18 18 16 15   Temp: 98.5 F (36.9 C) 98.3 F (36.8 C) 98.2 F (36.8 C) 98 F (36.7 C)  TempSrc: Axillary Oral Oral Oral  SpO2: 91% 96% 98% 98%  Weight:      Height:        Intake/Output Summary (Last 24  hours) at 09/07/17 1236 Last data filed at 09/07/17 0830  Gross per 24 hour  Intake              145 ml  Output              600 ml  Net             -455 ml   Filed Weights   09/06/17 0716  Weight: 53.1 kg (117 lb)    Examination: General appearance: Elderly adult female, awake, sl;eepy but oriented.   HEENT: Anicteric, conjunctiva pink, lids and lashes normal. No nasal deformity, discharge, epistaxis.  Lips dry.   Skin: Warm and dry.  No jaundice.  No suspicious rashes or lesions. Cardiac: RRR, nl S1-S2, no murmurs appreciated.  Capillary refill is brisk.  JVP normal.  No LE edema.  Radial pulses 2+ and symmetric. Respiratory: Normal respiratory rate and rhythm.  CTAB without rales or wheezes. Neuro: Awake, sleepy, but oriented.  Speech fluent.    Psych: Sensorium intact and responding to questions, attention diminished, sedated. Affect blunted.  Judgment and insight appear normal for age.    Data Reviewed: I have personally reviewed following labs and imaging studies:  CBC:  Recent Labs Lab 09/05/17 1007 09/06/17 1133  WBC 17.0* 13.5*  NEUTROABS 15.2*  --   HGB 13.0 12.3  HCT 38.0 35.5*  MCV 94.5 93.7  PLT 269 712   Basic Metabolic Panel:  Recent Labs Lab 09/05/17 1007 09/06/17 1133 09/07/17 0640  NA 130*  --   --  K 3.4*  --   --   CL 89*  --   --   CO2 28  --   --   GLUCOSE 206*  --   --   BUN 8  --   --   CREATININE 0.58 0.90  --   CALCIUM 9.1  --   --   MG  --   --  1.5*  PHOS  --   --  2.6   GFR: Estimated Creatinine Clearance: 30.4 mL/min (by C-G formula based on SCr of 0.9 mg/dL). Liver Function Tests: No results for input(s): AST, ALT, ALKPHOS, BILITOT, PROT, ALBUMIN in the last 168 hours. No results for input(s): LIPASE, AMYLASE in the last 168 hours. No results for input(s): AMMONIA in the last 168 hours. Coagulation Profile:  Recent Labs Lab 09/05/17 1007  INR 0.96   Cardiac Enzymes: No results for input(s): CKTOTAL, CKMB, CKMBINDEX,  TROPONINI in the last 168 hours. BNP (last 3 results) No results for input(s): PROBNP in the last 8760 hours. HbA1C:  Recent Labs  09/07/17 0640  HGBA1C 6.1*   CBG:  Recent Labs Lab 09/06/17 1118 09/06/17 1625 09/07/17 0000 09/07/17 0625 09/07/17 1146  GLUCAP 135* 268* 153* 140* 160*   Lipid Profile: No results for input(s): CHOL, HDL, LDLCALC, TRIG, CHOLHDL, LDLDIRECT in the last 72 hours. Thyroid Function Tests:  Recent Labs  09/05/17 1007  TSH 3.656   Anemia Panel: No results for input(s): VITAMINB12, FOLATE, FERRITIN, TIBC, IRON, RETICCTPCT in the last 72 hours. Urine analysis:    Component Value Date/Time   COLORURINE YELLOW 09/05/2017 1201   APPEARANCEUR CLOUDY (A) 09/05/2017 1201   LABSPEC 1.006 09/05/2017 1201   PHURINE 8.0 09/05/2017 1201   GLUCOSEU >=500 (A) 09/05/2017 1201   HGBUR SMALL (A) 09/05/2017 1201   BILIRUBINUR NEGATIVE 09/05/2017 1201   KETONESUR 5 (A) 09/05/2017 1201   PROTEINUR 100 (A) 09/05/2017 1201   UROBILINOGEN 0.2 08/21/2013 1615   NITRITE NEGATIVE 09/05/2017 1201   LEUKOCYTESUR TRACE (A) 09/05/2017 1201   Sepsis Labs: @LABRCNTIP (procalcitonin:4,lacticidven:4)  )No results found for this or any previous visit (from the past 240 hour(s)).       Radiology Studies: Dg Pelvis 1-2 Views  Result Date: 09/06/2017 CLINICAL DATA:  Right femur fracture fixation. EXAM: PELVIS - 1-2 VIEW COMPARISON:  Radiographs 09/05/2017 and 09/06/2017. FINDINGS: Status post recent right femoral intramedullary nail and dynamic screw fixation of a comminuted right intertrochanteric fracture. The hardware is well positioned. Previous left hip hemiarthroplasty. No evidence of pelvic fracture or dislocation. There is some gas within the soft tissues around the right hip. IMPRESSION: No demonstrated complication following right femoral intramedullary nail placement. Electronically Signed   By: Richardean Sale M.D.   On: 09/06/2017 12:49   Dg Knee 1-2 Views  Right  Result Date: 09/05/2017 CLINICAL DATA:  [place] this morning. EXAM: RIGHT KNEE - 1-2 VIEW COMPARISON:  None. FINDINGS: No acute fractures identified. No osteochondral lesion or joint effusion. Mild-to-moderate tricompartmental degenerative changes. IMPRESSION: No acute bony findings. Electronically Signed   By: Marijo Sanes M.D.   On: 09/05/2017 12:49   Dg C-arm 61-120 Min  Result Date: 09/06/2017 CLINICAL DATA:  Right femoral internal fixation EXAM: DG C-ARM 61-120 MIN; RIGHT FEMUR 2 VIEWS COMPARISON:  Plain films 09/05/2017 FINDINGS: Multiple intraoperative spot images demonstrate intramedullary nail placement and dynamic hip screw across the right femoral intertrochanteric fracture. Near anatomic alignment. No hardware bony complicating feature. Remote changes of left hip replacement. IMPRESSION: Internal fixation across  the right femoral intertrochanteric fracture with anatomic alignment and no visible complicating feature. Electronically Signed   By: Rolm Baptise M.D.   On: 09/06/2017 10:53   Dg Femur, Min 2 Views Right  Result Date: 09/06/2017 CLINICAL DATA:  Right femoral internal fixation EXAM: DG C-ARM 61-120 MIN; RIGHT FEMUR 2 VIEWS COMPARISON:  Plain films 09/05/2017 FINDINGS: Multiple intraoperative spot images demonstrate intramedullary nail placement and dynamic hip screw across the right femoral intertrochanteric fracture. Near anatomic alignment. No hardware bony complicating feature. Remote changes of left hip replacement. IMPRESSION: Internal fixation across the right femoral intertrochanteric fracture with anatomic alignment and no visible complicating feature. Electronically Signed   By: Rolm Baptise M.D.   On: 09/06/2017 10:53   Dg Femur Port, Min 2 Views Right  Result Date: 09/06/2017 CLINICAL DATA:  Right femur fracture post ORIF. EXAM: RIGHT FEMUR PORTABLE 2 VIEW COMPARISON:  Intraoperative radiograph same date. Additional radiographs 09/05/2017. FINDINGS: Right femoral  intramedullary nail and dynamic screw appear well positioned. There is a distal interlocking screw. There is improved alignment of the intertrochanteric femur fracture. The femoral head is located. There is no evidence of pelvic fracture. There is some gas in the soft tissue surrounding the proximal femur. IMPRESSION: Improved alignment of intertrochanteric right femur fracture status post finding nail fixation. No demonstrated complication. Electronically Signed   By: Richardean Sale M.D.   On: 09/06/2017 12:48        Scheduled Meds: . diltiazem  120 mg Oral Daily  . docusate sodium  100 mg Oral BID  . enoxaparin (LOVENOX) injection  40 mg Subcutaneous Q24H  . furosemide  20 mg Oral Daily  . insulin aspart  0-9 Units Subcutaneous TID WC  . levothyroxine  50 mcg Oral QAC breakfast  . memantine  10 mg Oral BID  . metoprolol  200 mg Oral q morning - 10a  . pravastatin  40 mg Oral Daily   Continuous Infusions: . dextrose 5 % and 0.45 % NaCl with KCl 20 mEq/L 100 mL/hr at 09/07/17 1157  . lactated ringers       LOS: 2 days    Time spent: 25 minutes    Edwin Dada, MD [facility] Pager [phone]  If 7PM-7AM, please contact night-coverage [email or web address] Password [facility] 09/07/2017, 12:36 PM

## 2017-09-07 NOTE — Progress Notes (Signed)
Subjective: 1 Day Post-Op Procedure(s) (LRB): INTRAMEDULLARY (IM) NAIL INTERTROCHANTRIC (Right) Patient reports pain as 4 on 0-10 scale.    Objective: Vital signs in last 24 hours: Temp:  [97.9 F (36.6 C)-98.5 F (36.9 C)] 98 F (36.7 C) (10/27 0629) Pulse Rate:  [73-101] 73 (10/27 0629) Resp:  [15-18] 15 (10/27 0629) BP: (117-148)/(60-93) 148/67 (10/27 0629) SpO2:  [91 %-100 %] 98 % (10/27 0629)  Intake/Output from previous day: 10/26 0701 - 10/27 0700 In: 1125 [P.O.:25; I.V.:1100] Out: 650 [Urine:550; Blood:100] Intake/Output this shift: Total I/O In: -  Out: 600 [Urine:600]   Recent Labs  09/05/17 1007 09/06/17 1133  HGB 13.0 12.3    Recent Labs  09/05/17 1007 09/06/17 1133  WBC 17.0* 13.5*  RBC 4.02 3.79*  HCT 38.0 35.5*  PLT 269 206    Recent Labs  09/05/17 1007 09/06/17 1133  NA 130*  --   K 3.4*  --   CL 89*  --   CO2 28  --   BUN 8  --   CREATININE 0.58 0.90  GLUCOSE 206*  --   CALCIUM 9.1  --     Recent Labs  09/05/17 1007  INR 0.96    ABD soft Neurovascular intact Intact pulses distally Incision: dressing C/D/I  Early chest congestion with cough  Assessment/Plan: 1 Day Post-Op Procedure(s) (LRB): INTRAMEDULLARY (IM) NAIL INTERTROCHANTRIC (Right)  Principal Problem:   Closed comminuted fracture of hip, right, initial encounter (HCC) Active Problems:   Chronic atrial fibrillation (HCC)   Hypothyroidism, acquired   Chronic diastolic CHF (congestive heart failure) (HCC)   Sick sinus syndrome (HCC)   Essential hypertension  Advance diet Up with therapy  Discussed discharge planning with patient and husband.  They would prefer placement at [facility] skilled nursing facility.  Shaneta Cervenka J 09/07/2017, 8:19 AM

## 2017-09-07 NOTE — Evaluation (Signed)
Physical Therapy Evaluation Patient Details Name: [name] MRN: [id number] DOB: 06/19/1926 Today's Date: 09/07/2017   History of Present Illness  83 y.o. female with past medical history of atrial fibrillation, CKD, diabetes, heart failure, Nissen fundoplication surgery (8250?) admitted after fall, sustaining right hip fx. IM nailing 09/06/17.   Clinical Impression  Patient presents limitations in strength and ability to transfer. She has pain with bed mobility. She will benefit from rehab at a SNF. She was unable to transfer to a chair today 2nd to pain. Acute therapy will continue to follow her for transfer training and gait training.     Follow Up Recommendations SNF    Equipment Recommendations  Rolling walker with 5" wheels    Recommendations for Other Services       Precautions / Restrictions Precautions Precautions: Fall Restrictions Weight Bearing Restrictions: Yes RLE Weight Bearing: Weight bearing as tolerated      Mobility  Bed Mobility Overal bed mobility: Needs Assistance Bed Mobility: Supine to Sit     Supine to sit: Max assist;+2 for physical assistance;+2 for safety/equipment     General bed mobility comments: MAx a to scoot to the edge of the bed and max a to sit. Sat at the edge of the bed for 45 minutes with therapy   Transfers Overall transfer level: Modified independent Equipment used: Rolling walker (2 wheeled)             General transfer comment: Attmepted to stand 2x with total assistx2. ptient unable to obtain and maintain standing position.she reported significant pain and requested to return to supine.   Ambulation/Gait                Stairs            Wheelchair Mobility    Modified Rankin (Stroke Patients Only)       Balance Overall balance assessment: Needs assistance Sitting-balance support: Bilateral upper extremity supported Sitting balance-Leahy Scale: Poor Sitting balance - Comments: needs assist  to remain sitting    Standing balance support: Bilateral upper extremity supported Standing balance-Leahy Scale: Zero Standing balance comment: unable to come to full standing position                              Pertinent Vitals/Pain Pain Assessment: 0-10 Pain Score: 8     Home Living Family/patient expects to be discharged to:: Skilled nursing facility Living Arrangements: Children Available Help at Discharge: Family;Available 24 hours/day Type of Home: House Home Access: Ramped entrance     Home Layout: One level Home Equipment: Grab bars - tub/shower;Shower seat;Walker - 2 wheels;Cane - single point Additional Comments: lives with son, agreeable to SNF upon d/c    Prior Function Level of Independence: Needs assistance   Gait / Transfers Assistance Needed: Uses SPC or RW at all times  ADL's / Homemaking Assistance Needed: Daughter helps with bathing, dressing, transportation; son completes all IADL        Hand Dominance   Dominant Hand: Right    Extremity/Trunk Assessment   Upper Extremity Assessment Upper Extremity Assessment: Generalized weakness    Lower Extremity Assessment Lower Extremity Assessment: RLE deficits/detail       Communication   Communication: No difficulties  Cognition Arousal/Alertness: Awake/alert Behavior During Therapy: WFL for tasks assessed/performed Overall Cognitive Status: Impaired/Different from baseline Area of Impairment: Memory;Following commands;Orientation  Orientation Level: Disoriented to;Time   Memory: Decreased recall of precautions;Decreased short-term memory Following Commands: Follows one step commands consistently       General Comments: Can answer most questions. Confusion appears to be improved from earlier in the day       General Comments General comments (skin integrity, edema, etc.): purwick in place     Exercises     Assessment/Plan    PT Assessment Patient  needs continued PT services  PT Problem List Decreased strength;Decreased range of motion;Decreased activity tolerance;Decreased balance;Decreased mobility;Decreased knowledge of use of DME;Decreased safety awareness;Decreased knowledge of precautions;Pain       PT Treatment Interventions Gait training;Stair training;Therapeutic activities;Therapeutic exercise;Neuromuscular re-education;Patient/family education    PT Goals (Current goals can be found in the Care Plan section)  Acute Rehab PT Goals Patient Stated Goal: to have less pain PT Goal Formulation: With patient Time For Goal Achievement: 09/14/17 Potential to Achieve Goals: Good    Frequency Min 4X/week   Barriers to discharge        Co-evaluation               AM-PAC PT "6 Clicks" Daily Activity  Outcome Measure Difficulty turning over in bed (including adjusting bedclothes, sheets and blankets)?: A Lot Difficulty moving from lying on back to sitting on the side of the bed? : A Lot Difficulty sitting down on and standing up from a chair with arms (e.g., wheelchair, bedside commode, etc,.)?: Unable Help needed moving to and from a bed to chair (including a wheelchair)?: Total Help needed walking in hospital room?: Total Help needed climbing 3-5 steps with a railing? : Total 6 Click Score: 8    End of Session Equipment Utilized During Treatment: Gait belt Activity Tolerance: Patient limited by fatigue;Patient limited by pain Patient left: in bed;with call bell/phone within reach;with bed alarm set;with nursing/sitter in room Nurse Communication: Mobility status;Other (comment) (Patient needed to have BM. Nursing notified for bed pan ) PT Visit Diagnosis: Unsteadiness on feet (R26.81);Pain;Muscle weakness (generalized) (M62.81);History of falling (Z91.81) Pain - Right/Left: Right Pain - part of body: Hip    Time: 1530-1551 PT Time Calculation (min) (ACUTE ONLY): 21 min   Charges:   PT Evaluation $PT Eval  Moderate Complexity: 1 Mod     PT G Codes:          Carney Living PT DPT  09/07/2017, 5:18 PM

## 2017-09-07 NOTE — Evaluation (Signed)
Occupational Therapy Evaluation Patient Details Name: [name] MRN: [id number] DOB: September 12, 1926 Today's Date: 09/07/2017    History of Present Illness 83 y.o. female with past medical history of atrial fibrillation, CKD, diabetes, heart failure, Nissen fundoplication surgery ([id number]?) admitted after fall, sustaining right hip fx. IM nailing 09/06/17.    Clinical Impression   PTA, pt required assistance from her children for some ADL and used a SPC or RW for mobility. Pt currently requires mod-max +2 assist for functional mobility including bed mobility due to severe R hip pain. Assisted pt in completing bed level UE and LE exercises. Pt unable to roll sufficiently to use bedpan - purwick device was utilized for pt to void. Pt plans d/c to a SNF for post-acute rehab prior to returning home with her son. Pt will benefit from continued acute OT to maximize independence and safety with ADL and mobility and reduce caregiver burden. Recommend SNF for post-acute rehab stay. OT will continue to follow acutely.    Follow Up Recommendations  SNF;Supervision/Assistance - 24 hour    Equipment Recommendations  Other (comment) (TBD at next venue)    Recommendations for Other Services       Precautions / Restrictions Precautions Precautions: Fall Restrictions Weight Bearing Restrictions: Yes RLE Weight Bearing: Weight bearing as tolerated      Mobility Bed Mobility Overal bed mobility: Needs Assistance                Transfers                 General transfer comment: Unable to progress to transfer training due to severe R hip pain    Balance                                           ADL either performed or assessed with clinical judgement   ADL Overall ADL's : Needs assistance/impaired Eating/Feeding: Set up;Cueing for safety;Sitting Eating/Feeding Details (indicate cue type and reason): cues to chin tuck when drinking liquids Grooming: Wash/dry  hands;Wash/dry face;Set up;Sitting                                       Vision Patient Visual Report: No change from baseline Vision Assessment?: No apparent visual deficits     Perception     Praxis      Pertinent Vitals/Pain Pain Assessment: 0-10 Pain Score: 8      Hand Dominance Right   Extremity/Trunk Assessment Upper Extremity Assessment Upper Extremity Assessment: Generalized weakness   Lower Extremity Assessment Lower Extremity Assessment: RLE deficits/detail;Defer to PT evaluation       Communication Communication Communication: No difficulties   Cognition Arousal/Alertness: Awake/alert Behavior During Therapy: WFL for tasks assessed/performed Overall Cognitive Status: Impaired/Different from baseline Area of Impairment: Memory;Following commands;Orientation                 Orientation Level: Disoriented to;Time   Memory: Decreased recall of precautions;Decreased short-term memory Following Commands: Follows one step commands consistently       General Comments: Pt appears generally confused likely due to medications, pain and lack of adequate sleep   General Comments  Pt with purwick in place for incontinence    Exercises Exercises: General Upper Extremity;General Lower Extremity General Exercises - Upper Extremity Shoulder Flexion: AAROM;Right;10  reps;Seated;Left Shoulder Extension: AAROM;Right;Left;10 reps;Seated Shoulder ABduction: Right;10 reps;Seated;Left;AAROM Shoulder ADduction: Right;10 reps;Seated;Left;AAROM Elbow Flexion: AROM;Right;Left;10 reps;Seated Elbow Extension: AROM;Right;Left;10 reps;Seated General Exercises - Lower Extremity Ankle Circles/Pumps: AROM;Right;Left;10 reps;Supine Hip ABduction/ADduction: AAROM;Left;5 reps;Supine Hip Flexion/Marching: AAROM;Left;5 reps;Supine   Shoulder Instructions      Home Living Family/patient expects to be discharged to:: Skilled nursing facility Living Arrangements:  Children (lives with son) Available Help at Discharge: Family;Available 24 hours/day Type of Home: House Home Access: Ramped entrance     Home Layout: One level     Bathroom Shower/Tub: Walk-in shower         Home Equipment: Grab bars - tub/shower;Shower seat;Walker - 2 wheels;Cane - single point          Prior Functioning/Environment Level of Independence: Needs assistance  Gait / Transfers Assistance Needed: Uses SPC or RW at all times ADL's / Homemaking Assistance Needed: Daughter helps with bathing, dressing, transportation; son completes all IADL            OT Problem List: Decreased strength;Decreased range of motion;Decreased activity tolerance;Impaired balance (sitting and/or standing);Decreased cognition;Decreased knowledge of use of DME or AE;Decreased safety awareness;Decreased knowledge of precautions;Pain      OT Treatment/Interventions: Self-care/ADL training;Therapeutic exercise;DME and/or AE instruction;Therapeutic activities;Patient/family education;Balance training    OT Goals(Current goals can be found in the care plan section) Acute Rehab OT Goals Patient Stated Goal: to have less pain OT Goal Formulation: With patient Time For Goal Achievement: 09/21/17 Potential to Achieve Goals: Good ADL Goals Pt Will Perform Eating: with set-up;sitting (EOB for 10 minutes with no physical assist) Pt Will Perform Grooming: with set-up;sitting (EOB for 10 minutes with no physical assist) Pt Will Perform Upper Body Bathing: with min assist;sitting Pt Will Perform Upper Body Dressing: with min assist;sitting Pt Will Transfer to Toilet: with mod assist;stand pivot transfer;bedside commode Pt Will Perform Toileting - Clothing Manipulation and hygiene: with mod assist;sit to/from stand Additional ADL Goal #1: Pt will complete bed mobility with mod assist and use of bedrails as needed.  OT Frequency: Min 2X/week   Barriers to D/C:            Co-evaluation               AM-PAC PT "6 Clicks" Daily Activity     Outcome Measure Help from another person eating meals?: None Help from another person taking care of personal grooming?: A Little Help from another person toileting, which includes using toliet, bedpan, or urinal?: Total Help from another person bathing (including washing, rinsing, drying)?: Total Help from another person to put on and taking off regular upper body clothing?: A Lot Help from another person to put on and taking off regular lower body clothing?: Total 6 Click Score: 12   End of Session Equipment Utilized During Treatment: Gait belt;Oxygen Nurse Communication: Mobility status;Weight bearing status  Activity Tolerance: Patient limited by pain Patient left: in bed;with call bell/phone within reach;with family/visitor present;with SCD's reapplied;with bed alarm set  OT Visit Diagnosis: Unsteadiness on feet (R26.81);History of falling (Z91.81);Other abnormalities of gait and mobility (R26.89);Other symptoms and signs involving cognitive function;Pain Pain - Right/Left: Right Pain - part of body: Leg                Time: 1610-9604 OT Time Calculation (min): 40 min Charges:  OT General Charges $OT Visit: 1 Visit OT Evaluation $OT Eval Moderate Complexity: 1 Mod OT Treatments $Self Care/Home Management : 23-37 mins G-Codes:     Redmond Baseman, MS, OTR/L 09/07/2017,  12:00 PM

## 2017-09-08 LAB — GLUCOSE, CAPILLARY
GLUCOSE-CAPILLARY: 120 mg/dL — AB (ref 65–99)
Glucose-Capillary: 131 mg/dL — ABNORMAL HIGH (ref 65–99)
Glucose-Capillary: 139 mg/dL — ABNORMAL HIGH (ref 65–99)
Glucose-Capillary: 125 mg/dL — ABNORMAL HIGH (ref 65–99)

## 2017-09-08 LAB — PTH, INTACT AND CALCIUM
CALCIUM TOTAL (PTH): 8.7 mg/dL (ref 8.7–10.3)
PTH: 30 pg/mL (ref 15–65)

## 2017-09-08 LAB — URINE CULTURE

## 2017-09-08 LAB — BASIC METABOLIC PANEL
ANION GAP: 8 (ref 5–15)
BUN: 8 mg/dL (ref 6–20)
CHLORIDE: 89 mmol/L — AB (ref 101–111)
CO2: 30 mmol/L (ref 22–32)
CREATININE: 0.61 mg/dL (ref 0.44–1.00)
Calcium: 8.8 mg/dL — ABNORMAL LOW (ref 8.9–10.3)
GFR calc non Af Amer: >60 mL/min (ref >60)
Glucose, Bld: 110 mg/dL — ABNORMAL HIGH (ref 65–99)
Potassium: 3.8 mmol/L (ref 3.5–5.1)
SODIUM: 127 mmol/L — AB (ref 135–145)

## 2017-09-08 LAB — CBC
HCT: 30.4 % — ABNORMAL LOW (ref 36.0–46.0)
HEMOGLOBIN: 10 g/dL — AB (ref 12.0–15.0)
MCH: 31.3 pg (ref 26.0–34.0)
MCHC: 32.9 g/dL (ref 30.0–36.0)
MCV: 95.3 fL (ref 78.0–100.0)
PLATELETS: 197 10*3/uL (ref 150–400)
RBC: 3.19 MIL/uL — ABNORMAL LOW (ref 3.87–5.11)
RDW: 13.7 % (ref 11.5–15.5)
WBC: 9 10*3/uL (ref 4.0–10.5)

## 2017-09-08 LAB — CALCIUM, IONIZED: Calcium, Ionized, Serum: 5 mg/dL (ref 4.5–5.6)

## 2017-09-08 MED ORDER — HYDROCODONE-ACETAMINOPHEN 10-325 MG PO TABS
0.5000 | ORAL_TABLET | ORAL | Status: DC | PRN
  Administered 2017-09-08 – 2017-09-09 (×2): 1 via ORAL
  Filled 2017-09-08 (×2): qty 1

## 2017-09-08 MED ORDER — HYDROMORPHONE HCL 1 MG/ML IJ SOLN: 0.5000 mg | INTRAMUSCULAR | Status: DC | PRN

## 2017-09-08 NOTE — Progress Notes (Addendum)
Subjective: 2 Days Post-Op Procedure(s) (LRB): INTRAMEDULLARY (IM) NAIL INTERTROCHANTRIC (Right) Patient reports pain as 4 on 0-10 scale.    Objective: Vital signs in last 24 hours: Temp:  [98 F (36.7 C)-98.1 F (36.7 C)] 98 F (36.7 C) (10/28 0542) Pulse Rate:  [70-84] 70 (10/28 0542) Resp:  [12-15] 12 (10/28 0542) BP: (125-129)/(57-80) 125/57 (10/28 0542) SpO2:  [99 %] 99 % (10/28 0542)  Intake/Output from previous day: 10/27 0701 - 10/28 0700 In: 120 [P.O.:120] Out: 1500 [Urine:1500] Intake/Output this shift: Total I/O In: 50 [P.O.:50] Out: -    Recent Labs  09/06/17 1133 09/08/17 0254  HGB 12.3 10.0*    Recent Labs  09/06/17 1133 09/08/17 0254  WBC 13.5* 9.0  RBC 3.79* 3.19*  HCT 35.5* 30.4*  PLT 206 197    Recent Labs  09/06/17 1133 09/08/17 0254  NA  --  127*  K  --  3.8  CL  --  89*  CO2  --  30  BUN  --  8  CREATININE 0.90 0.61  GLUCOSE  --  110*  CALCIUM  --  8.8*   No results for input(s): LABPT, INR in the last 72 hours.  ABD soft Neurovascular intact Sensation intact distally Dorsiflexion/Plantar flexion intact Incision: dressing C/D/I  Assessment/Plan: 2 Days Post-Op Procedure(s) (LRB): INTRAMEDULLARY (IM) NAIL INTERTROCHANTRIC (Right)  Principal Problem:   Closed comminuted fracture of hip, right, initial encounter [facility]) Active Problems:   Chronic atrial fibrillation (HCC)   Hypothyroidism, acquired   Chronic diastolic CHF (congestive heart failure) (HCC)   Sick sinus syndrome ([place])   Essential hypertension  Advance diet Up with therapy  To [facility] in [place] tomorrow if bed available  Estefania Kamiya J 09/08/2017, 1:33 PM

## 2017-09-08 NOTE — Progress Notes (Signed)
PROGRESS NOTE    [name]  AYT:[id number] DOB: 1926-08-01 DOA: 09/05/2017 PCP: Mayra Neer, MD      Brief Narrative:  83 yo F with AF not on warfarin, hypothyoridism, HFpEF, and SSS with pacer who presents with RIGHT hip fracture.   Assessment & Plan:  Principal Problem:   Closed comminuted fracture of hip, right, initial encounter [facility]) Active Problems:   Chronic atrial fibrillation (HCC)   Hypothyroidism, acquired   Chronic diastolic CHF (congestive heart failure) (HCC)   Sick sinus syndrome (HCC)   Essential hypertension   Comminuted right hip fracture POD 2 -Per Ortho -Advance diet   Hyponatremia: Mild, labs c/w ADH secr.  Appears euvolemic. Slightly worse. -Fluid restriction  Hypertension Well-controlled. -Continue losartan, metop, Cardizem -PRN for severe range pressures -Continue statin  Chronic atrial fibrillation CHADS2Vasc at least 5.  Not on warfarin due to diverticular hemorrhage.  Hypothyroidism -Continue levothyroxine  Chronic diastolic CHF Euvolemic.  Last EF 55% in Jan 2018. -Continue furosemide, BB, ARB  Other medications: -Continue Namenda  Asymptomatic bacteriuria Culture growing enterococcus and Pseudomonas.  However, I'm not sure why a culture was obtained.  I have elicited no symptoms of UTI.  Nursing have not either.  She is slightly altered, but nor more than expected given surgery, opiates.  No catheter at this time.  Would like to avoid antibiotics for asymptomatic bacteriuria. -Repeat culture -- if persistent, or symptoms develop, can given fosfomycin x1      DVT prophylaxis: Lovenox   Code Status: DNR Family Communication: None present  Disposition Plan: To SNF   Consultants:   Ortho  Procedures:   Intramedullary nailing of right hip    Subjective: Didn't sleep well.  Pain tolerable.  Weak, no CP.    Objective: Vitals:   09/07/17 0629 09/07/17 1322 09/07/17 2014 09/08/17 0542  BP: (!)  148/67 131/83 129/80 (!) 125/57  Pulse: 73 81 84 70  Resp: 15 16 15 12   Temp: 98 F (36.7 C) 97.9 F (36.6 C) 98.1 F (36.7 C) 98 F (36.7 C)  TempSrc: Oral Oral Axillary Axillary  SpO2: 98% 99% 99% 99%  Weight:      Height:        Intake/Output Summary (Last 24 hours) at 09/08/17 1319 Last data filed at 09/08/17 3557  Gross per 24 hour  Intake               50 ml  Output              900 ml  Net             -850 ml   Filed Weights   09/06/17 0716  Weight: 53.1 kg (117 lb)    Examination: General appearance: Very elderly adult female, sleepy, in no distress.   HEENT: Anicteric, conjunctiva pink, lids and lashes normal. No nasal deformity, discharge, epistaxis.   Skin: Warm and dry.  No jaundice.  No suspicious rashes or lesions. Cardiac: RRR, nl S1-S2, no murmurs appreciated.  Capillary refill is brisk.   No LE edema.  Radial pulses 2+ and symmetric. Abdomen: Abdomen soft.  No TTP. Mild suprapubic distension. MSK: No deformities or effusions.  Right hip sore. Neuro: Awake, sleepy, groggy.  Speech fluent.   Moves both arms equally.  Face symmetric     Data Reviewed: I have personally reviewed following labs and imaging studies:  CBC:  Recent Labs Lab 09/05/17 1007 09/06/17 1133 09/08/17 0254  WBC 17.0* 13.5* 9.0  NEUTROABS  15.2*  --   --   HGB 13.0 12.3 10.0*  HCT 38.0 35.5* 30.4*  MCV 94.5 93.7 95.3  PLT 269 206 283   Basic Metabolic Panel:  Recent Labs Lab 09/05/17 1007 09/06/17 1133 09/07/17 0640 09/08/17 0254  NA 130*  --   --  127*  K 3.4*  --   --  3.8  CL 89*  --   --  89*  CO2 28  --   --  30  GLUCOSE 206*  --   --  110*  BUN 8  --   --  8  CREATININE 0.58 0.90  --  0.61  CALCIUM 9.1  --   --  8.8*  MG  --   --  1.5*  --   PHOS  --   --  2.6  --    GFR: Estimated Creatinine Clearance: 34.2 mL/min (by C-G formula based on SCr of 0.61 mg/dL). Liver Function Tests: No results for input(s): AST, ALT, ALKPHOS, BILITOT, PROT, ALBUMIN in the  last 168 hours. No results for input(s): LIPASE, AMYLASE in the last 168 hours. No results for input(s): AMMONIA in the last 168 hours. Coagulation Profile:  Recent Labs Lab 09/05/17 1007  INR 0.96   Cardiac Enzymes: No results for input(s): CKTOTAL, CKMB, CKMBINDEX, TROPONINI in the last 168 hours. BNP (last 3 results) No results for input(s): PROBNP in the last 8760 hours. HbA1C:  Recent Labs  09/07/17 0640  HGBA1C 6.1*   CBG:  Recent Labs Lab 09/07/17 1146 09/07/17 1647 09/07/17 2031 09/08/17 0622 09/08/17 1149  GLUCAP 160* 103* 125* 120* 131*   Lipid Profile: No results for input(s): CHOL, HDL, LDLCALC, TRIG, CHOLHDL, LDLDIRECT in the last 72 hours. Thyroid Function Tests: No results for input(s): TSH, T4TOTAL, FREET4, T3FREE, THYROIDAB in the last 72 hours. Anemia Panel: No results for input(s): VITAMINB12, FOLATE, FERRITIN, TIBC, IRON, RETICCTPCT in the last 72 hours. Urine analysis:    Component Value Date/Time   COLORURINE YELLOW 09/05/2017 1201   APPEARANCEUR CLOUDY (A) 09/05/2017 1201   LABSPEC 1.006 09/05/2017 1201   PHURINE 8.0 09/05/2017 1201   GLUCOSEU >=500 (A) 09/05/2017 1201   HGBUR SMALL (A) 09/05/2017 1201   BILIRUBINUR NEGATIVE 09/05/2017 1201   KETONESUR 5 (A) 09/05/2017 1201   PROTEINUR 100 (A) 09/05/2017 1201   UROBILINOGEN 0.2 08/21/2013 1615   NITRITE NEGATIVE 09/05/2017 1201   LEUKOCYTESUR TRACE (A) 09/05/2017 1201   Sepsis Labs: @LABRCNTIP (procalcitonin:4,lacticidven:4)  ) Recent Results (from the past 240 hour(s))  Culture, Urine     Status: Abnormal   Collection Time: 09/06/17  5:41 PM  Result Value Ref Range Status   Specimen Description URINE, CATHETERIZED  Final   Special Requests NONE  Final   Culture (A)  Final    >=100,000 COLONIES/mL ENTEROCOCCUS FAECALIS >=100,000 COLONIES/mL PSEUDOMONAS AERUGINOSA    Report Status 09/08/2017 FINAL  Final   Organism ID, Bacteria ENTEROCOCCUS FAECALIS (A)  Final   Organism ID,  Bacteria PSEUDOMONAS AERUGINOSA (A)  Final      Susceptibility   Enterococcus faecalis - MIC*    AMPICILLIN <=2 SENSITIVE Sensitive     LEVOFLOXACIN 1 SENSITIVE Sensitive     NITROFURANTOIN <=16 SENSITIVE Sensitive     VANCOMYCIN <=0.5 SENSITIVE Sensitive     * >=100,000 COLONIES/mL ENTEROCOCCUS FAECALIS   Pseudomonas aeruginosa - MIC*    CEFTAZIDIME 4 SENSITIVE Sensitive     CIPROFLOXACIN <=0.25 SENSITIVE Sensitive     GENTAMICIN <=1 SENSITIVE Sensitive  IMIPENEM <=0.25 SENSITIVE Sensitive     PIP/TAZO <=4 SENSITIVE Sensitive     CEFEPIME <=1 SENSITIVE Sensitive     * >=100,000 COLONIES/mL PSEUDOMONAS AERUGINOSA         Radiology Studies: No results found.      Scheduled Meds: . diltiazem  120 mg Oral Daily  . docusate sodium  100 mg Oral BID  . enoxaparin (LOVENOX) injection  40 mg Subcutaneous Q24H  . furosemide  20 mg Oral Daily  . insulin aspart  0-9 Units Subcutaneous TID WC  . levothyroxine  50 mcg Oral QAC breakfast  . losartan  100 mg Oral Daily  . memantine  10 mg Oral BID  . metoprolol  200 mg Oral q morning - 10a  . pravastatin  40 mg Oral Daily   Continuous Infusions:   LOS: 3 days    Time spent: 25 minutes    Edwin Dada, MD [facility] Pager [phone]  If 7PM-7AM, please contact night-coverage [email or web address] Password [email or web address] 09/08/2017, 1:19 PM

## 2017-09-09 ENCOUNTER — Encounter (HOSPITAL_COMMUNITY): Payer: Self-pay | Admitting: Orthopedic Surgery

## 2017-09-09 DIAGNOSIS — E871 Hypo-osmolality and hyponatremia: Secondary | ICD-10-CM

## 2017-09-09 LAB — BASIC METABOLIC PANEL WITH GFR
Anion gap: 6 (ref 5–15)
BUN: 16 mg/dL (ref 6–20)
CO2: 32 mmol/L (ref 22–32)
Calcium: 8.6 mg/dL — ABNORMAL LOW (ref 8.9–10.3)
Chloride: 88 mmol/L — ABNORMAL LOW (ref 101–111)
Creatinine, Ser: 0.71 mg/dL (ref 0.44–1.00)
GFR calc Af Amer: >60 mL/min
GFR calc non Af Amer: >60 mL/min
Glucose, Bld: 114 mg/dL — ABNORMAL HIGH (ref 65–99)
Potassium: 4.6 mmol/L (ref 3.5–5.1)
Sodium: 126 mmol/L — ABNORMAL LOW (ref 135–145)

## 2017-09-09 LAB — GLUCOSE, CAPILLARY
GLUCOSE-CAPILLARY: 129 mg/dL — AB (ref 65–99)
Glucose-Capillary: 119 mg/dL — ABNORMAL HIGH (ref 65–99)
Glucose-Capillary: 164 mg/dL — ABNORMAL HIGH (ref 65–99)
Glucose-Capillary: 137 mg/dL — ABNORMAL HIGH (ref 65–99)

## 2017-09-09 LAB — CBC
HCT: 29.5 % — ABNORMAL LOW (ref 36.0–46.0)
HEMOGLOBIN: 9.7 g/dL — AB (ref 12.0–15.0)
MCH: 31.5 pg (ref 26.0–34.0)
MCHC: 32.9 g/dL (ref 30.0–36.0)
MCV: 95.8 fL (ref 78.0–100.0)
PLATELETS: 227 10*3/uL (ref 150–400)
RBC: 3.08 MIL/uL — AB (ref 3.87–5.11)
RDW: 13.7 % (ref 11.5–15.5)
WBC: 9.3 10*3/uL (ref 4.0–10.5)

## 2017-09-09 LAB — CALCITRIOL (1,25 DI-OH VIT D): VIT D 1 25 DIHYDROXY: 32.7 pg/mL (ref 19.9–79.3)

## 2017-09-09 LAB — VITAMIN D 25 HYDROXY (VIT D DEFICIENCY, FRACTURES): VIT D 25 HYDROXY: 30.4 ng/mL (ref 30.0–100.0)

## 2017-09-09 MED ORDER — HYDROCODONE-ACETAMINOPHEN 5-325 MG PO TABS
1.0000 | ORAL_TABLET | ORAL | Status: DC | PRN
  Administered 2017-09-09: 1 via ORAL
  Filled 2017-09-09: qty 1

## 2017-09-09 MED ORDER — HYDROMORPHONE HCL 1 MG/ML IJ SOLN
0.5000 mg | INTRAMUSCULAR | Status: DC | PRN
  Administered 2017-09-09: 0.5 mg via INTRAVENOUS
  Filled 2017-09-09: qty 1

## 2017-09-09 MED ORDER — CIPROFLOXACIN HCL 500 MG PO TABS
250.0000 mg | ORAL_TABLET | Freq: Two times a day (BID) | ORAL | Status: DC
  Administered 2017-09-09: 250 mg via ORAL
  Filled 2017-09-09: qty 1

## 2017-09-09 MED ORDER — HYDROCODONE-ACETAMINOPHEN 5-325 MG PO TABS
1.0000 | ORAL_TABLET | Freq: Four times a day (QID) | ORAL | Status: DC | PRN
  Administered 2017-09-09: 1 via ORAL
  Filled 2017-09-09: qty 1

## 2017-09-09 MED ORDER — FOSFOMYCIN TROMETHAMINE 3 G PO PACK
3.0000 g | PACK | Freq: Once | ORAL | Status: AC
Start: 2017-09-09 — End: 2017-09-09
  Administered 2017-09-09: 3 g via ORAL
  Filled 2017-09-09: qty 3

## 2017-09-09 NOTE — NC FL2 (Signed)
[place] MEDICAID FL2 LEVEL OF CARE SCREENING TOOL     IDENTIFICATION  Patient Name: LIBA HULSEY Birthdate: 09-15-1926 Sex: female Admission Date (Current Location): 09/05/2017  [facility] [place] Number:  Herbalist and Address:  [facility] [place], [place] [address], [place],  [address]      Provider Number: [id number]  Attending Physician Name and Address:  Edwin Dada, *  Relative Name and Phone Number:  April Chance, Daughter, [phone]    Current Level of Care: Hospital Recommended Level of Care: [place] Prior Approval Number:    Date Approved/Denied:   PASRR Number: [id number]  Discharge Plan: SNF    Current Diagnoses: Patient Active Problem List   Diagnosis Date Noted  . Closed comminuted fracture of hip, right, initial encounter ([place]) 09/05/2017  . Acute low back pain without sciatica   . Acute on chronic congestive heart failure ([place])   . DNR (do not resuscitate) discussion   . Acute on chronic diastolic CHF (congestive heart failure) ([place]) 11/14/2016  . Back pain 11/14/2016  . Essential hypertension 04/07/2015  . Edema 12/20/2014  . Mixed hyperlipidemia 04/06/2014  . Sick sinus syndrome ([place]) 12/14/2013  . Pacemaker 12/14/2013  . Unspecified constipation 08/27/2013  . Hypokalemia 08/24/2013  . Hypomagnesemia 08/24/2013  . Chronic diastolic CHF (congestive heart failure) ([place]) 08/24/2013  . Hypothyroidism, acquired 08/23/2013  . Hyponatremia 08/21/2013  . Left displaced femoral neck fracture ([place]) 08/21/2013  . History of sick sinus syndrome 08/21/2013  . Breast cyst 08/21/2013  . GIB (gastrointestinal bleeding) 12/10/2011  . Diverticulosis of colon with hemorrhage 12/10/2011  . Hyperglycemia 12/10/2011  . Chronic atrial fibrillation ([place]) 12/10/2011  . Gallstones 09/14/2011    Orientation RESPIRATION BLADDER Height & Weight     Self  O2 (Nasal Cannula) Continent Weight: 117  lb (53.1 kg) Height:  5' (152.4 cm)  BEHAVIORAL SYMPTOMS/MOOD NEUROLOGICAL BOWEL NUTRITION STATUS      Continent Diet (See DC Summary)  AMBULATORY STATUS COMMUNICATION OF NEEDS Skin   Extensive Assist Verbally Surgical wounds                       Personal Care Assistance Level of Assistance  Dressing, Bathing, Feeding Bathing Assistance: Maximum assistance Feeding assistance: Limited assistance Dressing Assistance: Maximum assistance     Functional Limitations Info             SPECIAL CARE FACTORS FREQUENCY  PT (By licensed PT), OT (By licensed OT)     PT Frequency: 4x week OT Frequency: 2x week            Contractures Contractures Info: Not present    Additional Factors Info  Code Status, Allergies, Insulin Sliding Scale Code Status Info: DNR Allergies Info: ACE INHIBITORS, ASPIRIN, SULFA DRUGS CROSS REACTORS, ATORVASTATIN, WELCHOL COLESEVELAM HCL    Insulin Sliding Scale Info: Insulin Daily       Current Medications (09/09/2017):  This is the current hospital active medication list Current Facility-Administered Medications  Medication Dose Route Frequency Provider Last Rate Last Dose  . acetaminophen (TYLENOL) tablet 650 mg  650 mg Oral Q6H PRN Elwin Mocha, MD   650 mg at 09/06/17 1437   Or  . acetaminophen (TYLENOL) suppository 650 mg  650 mg Rectal Q6H PRN Elwin Mocha, MD      . bisacodyl (DULCOLAX) suppository 10 mg  10 mg Rectal Daily PRN Danford, Suann Larry, MD   10 mg at  09/07/17 1415  . ciprofloxacin (CIPRO) tablet 250 mg  250 mg Oral BID Ainsley Spinner, PA-C   250 mg at 09/09/17 0944  . diltiazem (CARDIZEM CD) 24 hr capsule 120 mg  120 mg Oral Daily Elwin Mocha, MD   120 mg at 09/09/17 0944  . docusate sodium (COLACE) capsule 100 mg  100 mg Oral BID Ainsley Spinner, PA-C   100 mg at 09/09/17 0944  . enoxaparin (LOVENOX) injection 40 mg  40 mg Subcutaneous Q24H Ainsley Spinner, PA-C   40 mg at 09/09/17 0734  . furosemide (LASIX) tablet 20 mg   20 mg Oral Daily Danford, Suann Larry, MD   20 mg at 09/09/17 0946  . hydrALAZINE (APRESOLINE) injection 10 mg  10 mg Intravenous Q8H PRN Elwin Mocha, MD   10 mg at 09/05/17 1820  . HYDROcodone-acetaminophen (NORCO/VICODIN) 5-325 MG per tablet 1 tablet  1 tablet Oral Q6H PRN Ainsley Spinner, PA-C   Stopped at 09/09/17 1001  . insulin aspart (novoLOG) injection 0-9 Units  0-9 Units Subcutaneous TID WC Elwin Mocha, MD   1 Units at 09/08/17 1657  . levothyroxine (SYNTHROID, LEVOTHROID) tablet 50 mcg  50 mcg Oral QAC breakfast Elwin Mocha, MD   50 mcg at 09/09/17 0734  . losartan (COZAAR) tablet 100 mg  100 mg Oral Daily Edwin Dada, MD   100 mg at 09/09/17 0945  . memantine (NAMENDA) tablet 10 mg  10 mg Oral BID Elwin Mocha, MD   10 mg at 09/09/17 0946  . menthol-cetylpyridinium (CEPACOL) lozenge 3 mg  1 lozenge Oral PRN Ainsley Spinner, PA-C       Or  . phenol (CHLORASEPTIC) mouth spray 1 spray  1 spray Mouth/Throat PRN Ainsley Spinner, PA-C      . metoprolol succinate (TOPROL-XL) 24 hr tablet 200 mg  200 mg Oral q morning - 10a Elwin Mocha, MD   200 mg at 09/09/17 0945  . metoprolol tartrate (LOPRESSOR) injection 5 mg  5 mg Intravenous Q6H PRN Blount, Scarlette Shorts T, NP      . ondansetron (ZOFRAN) tablet 4 mg  4 mg Oral Q6H PRN Elwin Mocha, MD       Or  . ondansetron [facility]) injection 4 mg  4 mg Intravenous Q6H PRN Elwin Mocha, MD   4 mg at 09/08/17 1657  . polyvinyl alcohol (LIQUIFILM TEARS) 1.4 % ophthalmic solution 1 drop  1 drop Both Eyes Q4H PRN Elwin Mocha, MD      . pravastatin (PRAVACHOL) tablet 40 mg  40 mg Oral Daily Elwin Mocha, MD   40 mg at 09/09/17 0944  . sodium chloride (OCEAN) 0.65 % nasal spray 1 spray  1 spray Each Nare Q4H PRN Elwin Mocha, MD         Discharge Medications: Please see discharge summary for a list of discharge medications.  Relevant Imaging Results:  Relevant Lab Results:   Additional Information SS#:[id number] [place], LCSW

## 2017-09-09 NOTE — Progress Notes (Signed)
Physical Therapy Treatment Patient Details Name: [name] MRN: [id number] DOB: [birth year] Today's Date: 09/09/2017    History of Present Illness 83 y.o. female with past medical history of atrial fibrillation, CKD, diabetes, heart failure, Nissen fundoplication surgery (8182?) admitted after fall, sustaining right hip fx. IM nailing 09/06/17.     PT Comments    Session focused on bed mobility and transfers, limited by pain and fatigue. Pt required max A x2 for supine to sit. Able to maintain static balance at EOB with 1 UE support on bed rail for 5 minutes, with flexed forward leaning posture. Max A. x2 for sit to stand and stand pivot transfer on LLE into bedside chair. Pt reporting high levels of pain throughout session. Pt will cont to follow to improve independence with mobility.     Follow Up Recommendations  SNF     Equipment Recommendations  None recommended by PT    Recommendations for Other Services       Precautions / Restrictions Precautions Precautions: Fall Restrictions Weight Bearing Restrictions: Yes RLE Weight Bearing: Weight bearing as tolerated    Mobility  Bed Mobility Overal bed mobility: Needs Assistance Bed Mobility: Supine to Sit     Supine to sit: Max assist;+2 for physical assistance;+2 for safety/equipment     General bed mobility comments: max A x2 to to scoot to EOB. Sat at edge of bed for 5 minutes with therapy, able to support herself with 1 UE support in foward flexed posture.   Transfers Overall transfer level: Needs assistance Equipment used: 2 person hand held assist Transfers: Sit to/from [facility] Sit to Stand: +2 physical assistance;Max assist Stand pivot transfers: +2 physical assistance;Max assist       General transfer comment: sit to stand 1x with max A x2. Stand pivot on L leg into bedside chair.   Ambulation/Gait                 Stairs            Wheelchair Mobility     Modified Rankin (Stroke Patients Only)       Balance Overall balance assessment: Needs assistance Sitting-balance support: Bilateral upper extremity supported Sitting balance-Leahy Scale: Poor Sitting balance - Comments: 1 UE support on bed rail to remain sitting   Standing balance support: Bilateral upper extremity supported Standing balance-Leahy Scale: Zero Standing balance comment: unable to come to full standing position                             Cognition Arousal/Alertness: Awake/alert Behavior During Therapy: WFL for tasks assessed/performed Overall Cognitive Status: Impaired/Different from baseline Area of Impairment: Memory;Following commands;Orientation                 Orientation Level: Disoriented to;Time     Following Commands: Follows one step commands consistently       General Comments: Can answer most questions. Confusion appears to be improved from earlier in the day       Exercises      General Comments        Pertinent Vitals/Pain Pain Assessment: Faces Faces Pain Scale: Hurts whole lot Pain Intervention(s): RN gave pain meds during session;Limited activity within patient's tolerance;Monitored during session;Repositioned    Home Living                      Prior Function  PT Goals (current goals can now be found in the care plan section) Acute Rehab PT Goals Patient Stated Goal: to have less pain PT Goal Formulation: With patient Time For Goal Achievement: 09/14/17 Potential to Achieve Goals: Fair Progress towards PT goals: Progressing toward goals    Frequency    Min 5X/week      PT Plan Current plan remains appropriate    Co-evaluation              AM-PAC PT "6 Clicks" Daily Activity  Outcome Measure  Difficulty turning over in bed (including adjusting bedclothes, sheets and blankets)?: Unable Difficulty moving from lying on back to sitting on the side of the bed? :  Unable Difficulty sitting down on and standing up from a chair with arms (e.g., wheelchair, bedside commode, etc,.)?: Unable Help needed moving to and from a bed to chair (including a wheelchair)?: A Lot Help needed walking in hospital room?: Total Help needed climbing 3-5 steps with a railing? : Total 6 Click Score: 7    End of Session Equipment Utilized During Treatment: Gait belt Activity Tolerance: Patient limited by fatigue;Patient limited by pain Patient left: in chair;with call bell/phone within reach;with chair alarm set;with family/visitor present Nurse Communication: Mobility status Pain - Right/Left: Right Pain - part of body: Hip     Time: 3785-8850 PT Time Calculation (min) (ACUTE ONLY): 17 min  Charges:  $Therapeutic Activity: 8-22 mins                    G Codes:       Reinaldo Berber, PT, DPT Acute Rehab Services Pager: [phone]     Reinaldo Berber 09/09/2017, 4:26 PM

## 2017-09-09 NOTE — Progress Notes (Signed)
Pt states her right foot is burning. RN assessed pt's foot. Pt's foot is warm, but not swollen. MD notified.

## 2017-09-09 NOTE — Progress Notes (Signed)
PROGRESS NOTE    [name]  HYQ:[id number] DOB: 25-Aug-1926 DOA: 09/05/2017 PCP: Mayra Neer, MD      Brief Narrative:  83 yo F with AF not on warfarin, hypothyoridism, HFpEF, and SSS with pacer who presents with RIGHT hip fracture.   Assessment & Plan:  Principal Problem:   Closed comminuted fracture of hip, right, initial encounter [facility]) Active Problems:   Chronic atrial fibrillation (HCC)   Hypothyroidism, acquired   Chronic diastolic CHF (congestive heart failure) (HCC)   Sick sinus syndrome (HCC)   Essential hypertension   Comminuted right hip fracture POD 3 -Per Ortho   Hyponatremia: Worsening.  Baseline Na ~125-130.  Treating this will be at odds with risk of dehydrating her. -Fluid restriction and salt liberalization -BMP tomorrow  Delirium From age, underlying poor cognitive reserve, hyponatrmeia, opiates, surgery, dehydration  Delirium precautions:   -Lights and TV off, minimize interruptions at night  -Blinds open and lights on during day  -Glasses/hearing aid with patient  -Frequent reorientation  -PT/OT when able  -Avoid sedation medications/Beers list medications  Hypertension Well-controlled. -Continue losartan, metop, Cardizem -Continue statin  Chronic atrial fibrillation CHADS2Vasc at least 5.  Not on warfarin due to diverticular hemorrhage.  Hypothyroidism -Continue levothyroxine  Chronic diastolic CHF Euvolemic.  Last EF 55% in Jan 2018. -Continue furosemide, BB, ARB  Other medications: -Continue Namenda  Asymptomatic bacteriuria Unclear that this should be treated.  It is not contibuting to her altered mental status -Fosfomycin x1     DVT prophylaxis: Lovenox   Code Status: DNR Family Communication: Daughter at bedside  Disposition Plan: To SNF   Consultants:   Ortho  Procedures:   Intramedullary nailing of right hip    Subjective: Sitting up, more confused today than yesterday.  Hip pain  present.  Weak.  No chest pain, fever, dyspnea, vomiting.    Objective: Vitals:   09/08/17 1300 09/08/17 2015 09/09/17 0547 09/09/17 0900  BP: 111/66 (!) 114/54 (!) 125/53 (!) 123/46  Pulse: 88 71 70 72  Resp: 14     Temp: 97.6 F (36.4 C) 97.6 F (36.4 C) 98.2 F (36.8 C)   TempSrc: Axillary Oral Oral   SpO2: 98% 97% 98%   Weight:      Height:        Intake/Output Summary (Last 24 hours) at 09/09/17 1321 Last data filed at 09/09/17 0549  Gross per 24 hour  Intake                0 ml  Output              300 ml  Net             -300 ml   Filed Weights   09/06/17 0716  Weight: 53.1 kg (117 lb)    Examination: General appearance: Very elderly adult female, sleepy, in no distress.   HEENT: Anicteric, conjunctiva pink, lids and lashes normal. No nasal deformity, discharge, epistaxis.   Skin: Warm and dry.  No jaundice.  No suspicious rashes or lesions. Cardiac: RRR, nl S1-S2, no murmurs appreciated.  Capillary refill is brisk.   No LE edema.  Radial pulses 2+ and symmetric. Abdomen: Abdomen soft.  No TTP.  MSK: No deformities or effusions.  Right hip sore. Neuro: Awake, sleepy, groggy.  Speech fluent.   Moves both arms equally.  Face symmetric.  More disoriented today, unable to tell me she is in hospital.     Data Reviewed: I have personally  reviewed following labs and imaging studies:  CBC:  Recent Labs Lab 09/05/17 1007 09/06/17 1133 09/08/17 0254 09/09/17 0339  WBC 17.0* 13.5* 9.0 9.3  NEUTROABS 15.2*  --   --   --   HGB 13.0 12.3 10.0* 9.7*  HCT 38.0 35.5* 30.4* 29.5*  MCV 94.5 93.7 95.3 95.8  PLT 269 206 197 295   Basic Metabolic Panel:  Recent Labs Lab 09/05/17 1007 09/06/17 1133 09/07/17 0640 09/08/17 0254 09/09/17 0339  NA 130*  --   --  127* 126*  K 3.4*  --   --  3.8 4.6  CL 89*  --   --  89* 88*  CO2 28  --   --  30 32  GLUCOSE 206*  --   --  110* 114*  BUN 8  --   --  8 16  CREATININE 0.58 0.90  --  0.61 0.71  CALCIUM 9.1  --  8.7  8.8* 8.6*  MG  --   --  1.5*  --   --   PHOS  --   --  2.6  --   --    GFR: Estimated Creatinine Clearance: 34.2 mL/min (by C-G formula based on SCr of 0.71 mg/dL). Liver Function Tests: No results for input(s): AST, ALT, ALKPHOS, BILITOT, PROT, ALBUMIN in the last 168 hours. No results for input(s): LIPASE, AMYLASE in the last 168 hours. No results for input(s): AMMONIA in the last 168 hours. Coagulation Profile:  Recent Labs Lab 09/05/17 1007  INR 0.96   Cardiac Enzymes: No results for input(s): CKTOTAL, CKMB, CKMBINDEX, TROPONINI in the last 168 hours. BNP (last 3 results) No results for input(s): PROBNP in the last 8760 hours. HbA1C:  Recent Labs  09/07/17 0640  HGBA1C 6.1*   CBG:  Recent Labs Lab 09/08/17 1149 09/08/17 1621 09/08/17 2020 09/09/17 0556 09/09/17 1140  GLUCAP 131* 139* 125* 119* 164*   Lipid Profile: No results for input(s): CHOL, HDL, LDLCALC, TRIG, CHOLHDL, LDLDIRECT in the last 72 hours. Thyroid Function Tests: No results for input(s): TSH, T4TOTAL, FREET4, T3FREE, THYROIDAB in the last 72 hours. Anemia Panel: No results for input(s): VITAMINB12, FOLATE, FERRITIN, TIBC, IRON, RETICCTPCT in the last 72 hours. Urine analysis:    Component Value Date/Time   COLORURINE YELLOW 09/05/2017 1201   APPEARANCEUR CLOUDY (A) 09/05/2017 1201   LABSPEC 1.006 09/05/2017 1201   PHURINE 8.0 09/05/2017 1201   GLUCOSEU >=500 (A) 09/05/2017 1201   HGBUR SMALL (A) 09/05/2017 1201   BILIRUBINUR NEGATIVE 09/05/2017 1201   KETONESUR 5 (A) 09/05/2017 1201   PROTEINUR 100 (A) 09/05/2017 1201   UROBILINOGEN 0.2 08/21/2013 1615   NITRITE NEGATIVE 09/05/2017 1201   LEUKOCYTESUR TRACE (A) 09/05/2017 1201   Sepsis Labs: @LABRCNTIP (procalcitonin:4,lacticidven:4)  ) Recent Results (from the past 240 hour(s))  Culture, Urine     Status: Abnormal   Collection Time: 09/06/17  5:41 PM  Result Value Ref Range Status   Specimen Description URINE, CATHETERIZED   Final   Special Requests NONE  Final   Culture (A)  Final    >=100,000 COLONIES/mL ENTEROCOCCUS FAECALIS >=100,000 COLONIES/mL PSEUDOMONAS AERUGINOSA    Report Status 09/08/2017 FINAL  Final   Organism ID, Bacteria ENTEROCOCCUS FAECALIS (A)  Final   Organism ID, Bacteria PSEUDOMONAS AERUGINOSA (A)  Final      Susceptibility   Enterococcus faecalis - MIC*    AMPICILLIN <=2 SENSITIVE Sensitive     LEVOFLOXACIN 1 SENSITIVE Sensitive     NITROFURANTOIN <=16 SENSITIVE  Sensitive     VANCOMYCIN <=0.5 SENSITIVE Sensitive     * >=100,000 COLONIES/mL ENTEROCOCCUS FAECALIS   Pseudomonas aeruginosa - MIC*    CEFTAZIDIME 4 SENSITIVE Sensitive     CIPROFLOXACIN <=0.25 SENSITIVE Sensitive     GENTAMICIN <=1 SENSITIVE Sensitive     IMIPENEM <=0.25 SENSITIVE Sensitive     PIP/TAZO <=4 SENSITIVE Sensitive     CEFEPIME <=1 SENSITIVE Sensitive     * >=100,000 COLONIES/mL PSEUDOMONAS AERUGINOSA  Culture, Urine     Status: Abnormal (Preliminary result)   Collection Time: 09/08/17  2:07 PM  Result Value Ref Range Status   Specimen Description URINE, RANDOM  Final   Special Requests Normal  Final   Culture >=100,000 COLONIES/mL PSEUDOMONAS AERUGINOSA (A)  Final   Report Status PENDING  Incomplete         Radiology Studies: No results found.      Scheduled Meds: . diltiazem  120 mg Oral Daily  . docusate sodium  100 mg Oral BID  . enoxaparin (LOVENOX) injection  40 mg Subcutaneous Q24H  . furosemide  20 mg Oral Daily  . insulin aspart  0-9 Units Subcutaneous TID WC  . levothyroxine  50 mcg Oral QAC breakfast  . losartan  100 mg Oral Daily  . memantine  10 mg Oral BID  . metoprolol  200 mg Oral q morning - 10a  . pravastatin  40 mg Oral Daily   Continuous Infusions:   LOS: 4 days    Time spent: 25 minutes    Edwin Dada, MD [facility] Pager [phone]  If 7PM-7AM, please contact night-coverage [email or web address] Password [email or web address] 09/09/2017, 1:21 PM

## 2017-09-09 NOTE — Social Work (Signed)
Family has accepted be offer from [facility].  CSW confirmed with admissions, healther.  CSW faxed [employer] Medicare clinicals to review so that they can provide auth for SNF placement.  CSW will continue to follow.  Elissa Hefty, LCSW Clinical Social Worker [phone]

## 2017-09-09 NOTE — Social Work (Signed)
CSW received Insurance [phone] for SNF placement at [facility]. Rug lvl:RVB, 543 minutes a week. April, [phone].  CSW will continue to follow for disposition.  Elissa Hefty, LCSW Clinical Social Worker [phone]

## 2017-09-09 NOTE — Progress Notes (Signed)
MD states to page him in 30 minutes and he will come up to assess pt's foot. RN assessed pt's feet again. Pt's right foot is slightly warm compared to her left foot which is cool. Pt states her foot is still burning.

## 2017-09-09 NOTE — Progress Notes (Signed)
Orthopedic Trauma Service Progress Note   Patient ID: [name] MRN: [id number] DOB/AGE: August 21, 1926 83 y.o.  Subjective:  Daughter in room  States pt not really at baseline in terms on mental status  Daughter also notes that pt seems to be holding food in mouth  She has been seen and evaluated by ST, recommended full liquid diet with advancement per RN discretion   Plan for eventual dc to [facility]   Urinalysis was obtained on admission. Specimen was cloudy, + bacteria, trace LE, >500 glucose. Thus urine culture was ordered   Pt quite somnolent this am   ROS As above  Objective:   VITALS:   Vitals:   09/08/17 0542 09/08/17 1300 09/08/17 2015 09/09/17 0547  BP: (!) 125/57 111/66 (!) 114/54 (!) 125/53  Pulse: 70 88 71 70  Resp: 12 14    Temp: 98 F (36.7 C) 97.6 F (36.4 C) 97.6 F (36.4 C) 98.2 F (36.8 C)  TempSrc: Axillary Axillary Oral Oral  SpO2: 99% 98% 97% 98%  Weight:      Height:        Estimated body mass index is 22.85 kg/m as calculated from the following:   Height as of this encounter: 5' (1.524 m).   Weight as of this encounter: 53.1 kg (117 lb).   Intake/Output      10/28 0701 - 10/29 0700 10/29 0701 - 10/30 0700   P.O. 50    Total Intake(mL/kg) 50 (0.9)    Urine (mL/kg/hr) 300 (0.2)    Total Output 300     Net -250            LABS  Results for orders placed or performed during the hospital encounter of 09/05/17 (from the past 24 hour(s))  Glucose, capillary     Status: Abnormal   Collection Time: 09/08/17 11:49 AM  Result Value Ref Range   Glucose-Capillary 131 (H) 65 - 99 mg/dL  Glucose, capillary     Status: Abnormal   Collection Time: 09/08/17  4:21 PM  Result Value Ref Range   Glucose-Capillary 139 (H) 65 - 99 mg/dL  Glucose, capillary     Status: Abnormal   Collection Time: 09/08/17  8:20 PM  Result Value Ref Range   Glucose-Capillary 125 (H) 65 - 99 mg/dL   Comment 1 Document in  Chart   Basic metabolic panel     Status: Abnormal   Collection Time: 09/09/17  3:39 AM  Result Value Ref Range   Sodium 126 (L) 135 - 145 mmol/L   Potassium 4.6 3.5 - 5.1 mmol/L   Chloride 88 (L) 101 - 111 mmol/L   CO2 32 22 - 32 mmol/L   Glucose, Bld 114 (H) 65 - 99 mg/dL   BUN 16 6 - 20 mg/dL   Creatinine, Ser 0.71 0.44 - 1.00 mg/dL   Calcium 8.6 (L) 8.9 - 10.3 mg/dL   GFR calc non Af Amer >60 >60 mL/min   GFR calc Af Amer >60 >60 mL/min   Anion gap 6 5 - 15  CBC     Status: Abnormal   Collection Time: 09/09/17  3:39 AM  Result Value Ref Range   WBC 9.3 4.0 - 10.5 K/uL   RBC 3.08 (L) 3.87 - 5.11 MIL/uL   Hemoglobin 9.7 (L) 12.0 - 15.0 g/dL   HCT 29.5 (L) 36.0 - 46.0 %   MCV 95.8 78.0 - 100.0 fL   MCH 31.5 26.0 - 34.0 pg  MCHC 32.9 30.0 - 36.0 g/dL   RDW 13.7 11.5 - 15.5 %   Platelets 227 150 - 400 K/uL  Glucose, capillary     Status: Abnormal   Collection Time: 09/09/17  5:56 AM  Result Value Ref Range   Glucose-Capillary 119 (H) 65 - 99 mg/dL   Comment 1 Document in Chart      PHYSICAL EXAM:   Gen: somnolent but arousable  Lungs: clear anterior fields Cardiac: RRR Abd: + BS, NT Ext:       Right Lower Extremity   Dressings c/d/i  Incisions are stable, no drainage, no signs of infection   Ext warm   Moving leg  Swelling as expected   Assessment/Plan: 3 Days Post-Op   Principal Problem:   Closed comminuted fracture of hip, right, initial encounter [facility]) Active Problems:   Chronic atrial fibrillation (HCC)   Hypothyroidism, acquired   Chronic diastolic CHF (congestive heart failure) (HCC)   Sick sinus syndrome (HCC)   Essential hypertension   Anti-infectives    Start     Dose/Rate Route Frequency Ordered Stop   09/09/17 0915  ciprofloxacin (CIPRO) tablet 250 mg     250 mg Oral 2 times daily 09/09/17 0908 09/12/17 0759   09/06/17 1500  ceFAZolin (ANCEF) IVPB 2g/100 mL premix     2 g 200 mL/hr over 30 Minutes Intravenous Every 6 hours 09/06/17 1120  09/06/17 2157   09/06/17 0811  ceFAZolin (ANCEF) 2-4 GM/100ML-% IVPB    Comments:  Vania Rea   : cabinet override      09/06/17 0811 09/06/17 2014    .  POD/HD#: 21   83 year old female ground-level fall with highly comminuted right peritrochanteric hip fracture   -R peritrochanteric hip fracture s/p IMN  WBAT   ROM as tolerated  Dressing changes as needed  PT/OT  - mental status changes  Likely multifactorial   Decrease narcotics to norco 5/325 1 po q6h prn severe pain    Add regular tylenol to be available as well    ? If mental status changes related to + urine cultures     pts altered mental status may preclude her from being able to relay symptoms     Will give cipro 250 mg po q12h x 3 days    Hyponatremia slightly worse this am     Continue per internal medicine    Currently on fluid restriction    Serum osmolality shows hypotonic state    - Pain management:            as above               - ABL anemia/Hemodynamics             cbc looks good  As expected for injury and surgery    - Medical issues              Per medical service               Diabetes                                   A1c in pre-diabetes range   Will need follow up with PCP               Metabolic bone workup             Patient has evidence of osteoporosis given numerous  vertebral compression fractures as well as this low energy hip fracture  PTH looks good as does TSH  Vitamin d levels are pending    - DVT/PE prophylaxis:             Lovenox  for 4 weeks   - ID:  cipro as above for UTI    - Metabolic Bone Disease:             labs still pending      - Dispo:            as above  Not sure she is medically ready for SNF today    Jari Pigg, PA-C [facility] [phone] Levi Aland (C) 09/09/2017, 9:09 AM

## 2017-09-09 NOTE — Clinical Social Work Note (Signed)
Clinical Social Work Assessment  Patient Details  Name: [name] MRN: [id number] Date of Birth: 1926/06/26  Date of referral:  09/09/17               Reason for consult:  Facility Placement                Permission sought to share information with:  Facility Sport and exercise psychologist Permission granted to share information::  Yes, Verbal Permission Granted  Name::     April Chance  Agency::  SNF's  Relationship::  daughter  Contact Information:     Housing/Transportation Living arrangements for the past 2 months:  Single Family Home Source of Information:  Adult Children Patient Interpreter Needed:  None Criminal Activity/Legal Involvement Pertinent to Current Situation/Hospitalization:  No - Comment as needed Significant Relationships:  Adult Children, Other Family Members Lives with:  Self, Adult Children Do you feel safe going back to the place where you live?  No Need for family participation in patient care:  Yes (Comment)  Care giving concerns:  Pt from with son. She ambulates at home with cane and very rarely uses cane. Pt not safe to return home at this time as clinical team recommends a SNF at discharge. Pt has experience with SNF in the past, per daughter at bedside, as patient has been to [facility].  Social Worker assessment / plan:  CSW explained the SNF process with daughter and patient. Since family has experienced SNF before, they are aware of the process. CSW obtained permission to send to [facility] and other SNf's. CSW explained the insurance process. CSW will continue to follow for disposition.  Employment status:  Retired Nurse, adult PT Recommendations:  [place] / Referral to community resources:  [place]  Patient/Family's Response to care:  Family appreciative of CSW assistance with SNF placement. No issues or concerns identified.  Patient/Family's Understanding of and Emotional  Response to Diagnosis, Current Treatment, and Prognosis:  Family has good understanding of diagnosis, current treatment and prognosis. Family hopeful that short term rehab will assist with new impairment and allow patient to return home. No other issues identified.  Emotional Assessment Appearance:  Appears stated age Attitude/Demeanor/Rapport:   (Asleep, Quiet) Affect (typically observed):  Calm, Quiet Orientation:  Oriented to Self Alcohol / Substance use:  Not Applicable Psych involvement (Current and /or in the community):  No (Comment)  Discharge Needs  Concerns to be addressed:    Readmission within the last 30 days:  No Current discharge risk:  Dependent with Mobility, Physical Impairment Barriers to Discharge:  No Barriers Identified   Normajean Baxter, LCSW 09/09/2017, 10:28 AM

## 2017-09-10 LAB — BASIC METABOLIC PANEL
Anion gap: 8 (ref 5–15)
BUN: 19 mg/dL (ref 6–20)
CO2: 31 mmol/L (ref 22–32)
Calcium: 8.9 mg/dL (ref 8.9–10.3)
Chloride: 91 mmol/L — ABNORMAL LOW (ref 101–111)
Creatinine, Ser: 0.73 mg/dL (ref 0.44–1.00)
GFR calc Af Amer: >60 mL/min (ref >60)
GLUCOSE: 126 mg/dL — AB (ref 65–99)
POTASSIUM: 4.7 mmol/L (ref 3.5–5.1)
Sodium: 130 mmol/L — ABNORMAL LOW (ref 135–145)

## 2017-09-10 LAB — URINE CULTURE: SPECIAL REQUESTS: NORMAL

## 2017-09-10 LAB — GLUCOSE, CAPILLARY
GLUCOSE-CAPILLARY: 137 mg/dL — AB (ref 65–99)
Glucose-Capillary: 159 mg/dL — ABNORMAL HIGH (ref 65–99)

## 2017-09-10 MED ORDER — VITAMIN D (ERGOCALCIFEROL) 1.25 MG (50000 UNIT) PO CAPS: 50000.0000 [IU] | ORAL_CAPSULE | ORAL | 0 refills | Status: AC

## 2017-09-10 MED ORDER — BISACODYL 10 MG RE SUPP: 10.0000 mg | Freq: Every day | RECTAL | 0 refills | Status: AC | PRN

## 2017-09-10 MED ORDER — ACETAMINOPHEN 325 MG PO TABS: 325.0000 mg | ORAL_TABLET | Freq: Four times a day (QID) | ORAL | 0 refills | Status: AC | PRN

## 2017-09-10 MED ORDER — HYDROCODONE-ACETAMINOPHEN 5-325 MG PO TABS: 1.0000 | ORAL_TABLET | ORAL | 0 refills | Status: AC | PRN

## 2017-09-10 MED ORDER — ENOXAPARIN SODIUM 40 MG/0.4ML ~~LOC~~ SOLN: 40.0000 mg | SUBCUTANEOUS | 0 refills | Status: AC

## 2017-09-10 MED ORDER — DOCUSATE SODIUM 100 MG PO CAPS: 100.0000 mg | ORAL_CAPSULE | Freq: Two times a day (BID) | ORAL | 0 refills | Status: AC

## 2017-09-10 NOTE — Discharge Instructions (Signed)
[facility] Discharge Instructions   General Discharge Instructions  WEIGHT BEARING STATUS: Weightbearing as tolerated right lower extremity   RANGE OF MOTION/ACTIVITY: Unrestricted range of motion right hip, knee and ankle  Patient is to wear PRAFO boot while at rest and in bed.  Do not wear boot while ambulating or working with therapy.  Wound Care: Daily wound care starting on arrival to nursing facility.  Daily dressing changes as needed.  Okay to leave wounds open to air once they are completely dry.  Please see instructions below  Discharge Wound Care Instructions  Do NOT apply any ointments, solutions or lotions to pin sites or surgical wounds.  These prevent needed drainage and even though solutions like hydrogen peroxide kill bacteria, they also damage cells lining the pin sites that help fight infection.  Applying lotions or ointments can keep the wounds moist and can cause them to breakdown and open up as well. This can increase the risk for infection. When in doubt call the office.  Surgical incisions should be dressed daily.  If any drainage is noted, use one layer of adaptic, then gauze, Kerlix, and an ace wrap.  Once the incision is completely dry and without drainage, it may be left open to air out.  Showering may begin 36-48 hours later.  Cleaning gently with soap and water.  Traumatic wounds should be dressed daily as well.    One layer of adaptic, gauze, Kerlix, then ace wrap.  The adaptic can be discontinued once the draining has ceased    If you have a wet to dry dressing: wet the gauze with saline the squeeze as much saline out so the gauze is moist (not soaking wet), place moistened gauze over wound, then place a dry gauze over the moist one, followed by Kerlix wrap, then ace wrap.   DVT/PE prophylaxis: Lovenox times 28 days  Diet: as you were eating previously.  Can use over the counter stool softeners and bowel preparations, such as Miralax, to  help with bowel movements.  Narcotics can be constipating.  Be sure to drink plenty of fluids  PAIN MEDICATION USE AND EXPECTATIONS  You have likely been given narcotic medications to help control your pain.  After a traumatic event that results in an fracture (broken bone) with or without surgery, it is ok to use narcotic pain medications to help control one's pain.  We understand that everyone responds to pain differently and each individual patient will be evaluated on a regular basis for the continued need for narcotic medications. Ideally, narcotic medication use should last no more than 6-8 weeks (coinciding with fracture healing).   As a patient it is your responsibility as well to monitor narcotic medication use and report the amount and frequency you use these medications when you come to your office visit.   We would also advise that if you are using narcotic medications, you should take a dose prior to therapy to maximize you participation.  IF YOU ARE ON NARCOTIC MEDICATIONS IT IS NOT PERMISSIBLE TO OPERATE A MOTOR VEHICLE (MOTORCYCLE/CAR/TRUCK/MOPED) OR HEAVY MACHINERY DO NOT MIX NARCOTICS WITH OTHER CNS (CENTRAL NERVOUS SYSTEM) DEPRESSANTS SUCH AS ALCOHOL   STOP SMOKING OR USING NICOTINE PRODUCTS!!!!  As discussed nicotine severely impairs your body's ability to heal surgical and traumatic wounds but also impairs bone healing.  Wounds and bone heal by forming microscopic blood vessels (angiogenesis) and nicotine is a vasoconstrictor (essentially, shrinks blood vessels).  Therefore, if vasoconstriction occurs to these microscopic  blood vessels they essentially disappear and are unable to deliver necessary nutrients to the healing tissue.  This is one modifiable factor that you can do to dramatically increase your chances of healing your injury.    (This means no smoking, no nicotine gum, patches, etc)  DO NOT USE NONSTEROIDAL ANTI-INFLAMMATORY DRUGS (NSAID'S)  Using products such as  Advil (ibuprofen), Aleve (naproxen), Motrin (ibuprofen) for additional pain control during fracture healing can delay and/or prevent the healing response.  If you would like to take over the counter (OTC) medication, Tylenol (acetaminophen) is ok.  However, some narcotic medications that are given for pain control contain acetaminophen as well. Therefore, you should not exceed more than 4000 mg of tylenol in a day if you do not have liver disease.  Also note that there are may OTC medicines, such as cold medicines and allergy medicines that my contain tylenol as well.  If you have any questions about medications and/or interactions please ask your doctor/PA or your pharmacist.      ICE AND ELEVATE INJURED/OPERATIVE EXTREMITY  Using ice and elevating the injured extremity above your heart can help with swelling and pain control.  Icing in a pulsatile fashion, such as 20 minutes on and 20 minutes off, can be followed.    Do not place ice directly on skin. Make sure there is a barrier between to skin and the ice pack.    Using frozen items such as frozen peas works well as the conform nicely to the are that needs to be iced.  USE AN ACE WRAP OR TED HOSE FOR SWELLING CONTROL  In addition to icing and elevation, Ace wraps or TED hose are used to help limit and resolve swelling.  It is recommended to use Ace wraps or TED hose until you are informed to stop.    When using Ace Wraps start the wrapping distally (farthest away from the body) and wrap proximally (closer to the body)   Example: If you had surgery on your leg or thing and you do not have a splint on, start the ace wrap at the toes and work your way up to the thigh        If you had surgery on your upper extremity and do not have a splint on, start the ace wrap at your fingers and work your way up to the upper arm  IF YOU ARE IN A SPLINT OR CAST DO NOT [place]   If your splint gets wet for any reason please contact the office  immediately. You may shower in your splint or cast as long as you keep it dry.  This can be done by wrapping in a cast cover or garbage back (or similar)  Do Not stick any thing down your splint or cast such as pencils, money, or hangers to try and scratch yourself with.  If you feel itchy take benadryl as prescribed on the bottle for itching  IF YOU ARE IN A CAM BOOT (BLACK BOOT)  You may remove boot periodically. Perform daily dressing changes as noted below.  Wash the liner of the boot regularly and wear a sock when wearing the boot. It is recommended that you sleep in the boot until told otherwise  CALL THE OFFICE WITH ANY QUESTIONS OR CONCERNS: [phone]

## 2017-09-10 NOTE — Clinical Social Work Placement (Signed)
   CLINICAL SOCIAL WORK PLACEMENT  NOTE  Date:  09/10/2017  Patient Details  Name: [name] MRN: [id number] Date of Birth: 12-27-1925  Clinical Social Work is seeking post-discharge placement for this patient at the [place] level of care (*CSW will initial, date and re-position this form in  chart as items are completed):  Yes   Patient/family provided with [facility]'s list of facilities offering this level of care within the geographic area requested by the patient (or if unable, by the patient's family).  Yes   Patient/family informed of their freedom to choose among providers that offer the needed level of care, that participate in Medicare, Medicaid or managed care program needed by the patient, have an available bed and are willing to accept the patient.  Yes   Patient/family informed of [facility] ownership interest in [facility] and [facility], as well as of the fact that they are under no obligation to receive care at these facilities.  PASRR submitted to EDS on       PASRR number received on 09/10/17     Existing PASRR number confirmed on       FL2 transmitted to all facilities in geographic area requested by pt/family on 09/10/17     FL2 transmitted to all facilities within larger geographic area on       Patient informed that his/her managed care company has contracts with or will negotiate with certain facilities, including the following:        Yes   Patient/family informed of bed offers received.  Patient chooses bed at [place], [place]     Physician recommends and patient chooses bed at      Patient to be transferred to [place], [place] on 09/10/17.  Patient to be transferred to facility by [facility]     Patient family notified on 09/10/17 of transfer.  Name of family member notified:  daughter at bedside     PHYSICIAN       Additional Comment:     _______________________________________________ Normajean Baxter, LCSW 09/10/2017, 11:47 AM

## 2017-09-10 NOTE — Social Work (Signed)
Clinical Social Worker facilitated patient discharge including contacting patient family and facility to confirm patient discharge plans.  Clinical information faxed to facility and family agreeable with plan.    CSW arranged ambulance transport via [facility] to [place] at 2:00pm.    RN to call [phone] to give report prior to discharge. Pt going to Room 103A.  Clinical Social Worker will sign off for now as social work intervention is no longer needed. Please consult [place] again if new need arises.  Elissa Hefty, LCSW Clinical Social Worker [phone]

## 2017-09-10 NOTE — Discharge Summary (Signed)
Physician Discharge Summary  [name] JWJ:[id number] DOB: 09-Jan-1926 DOA: 09/05/2017  PCP: Mayra Neer, MD  Admit date: 09/05/2017 Discharge date: 09/10/2017  Admitted From: Home Disposition:  SNF for rehab  Recommendations for Outpatient Follow-up:  1. Follow up with PCP in 1-2 weeks 2. Please obtain BMP in one week 3. Consider restarting gabapentin as needed if patient is without delirium 4. Lovenox for 4 weeks 5. PRAFO for right foot 6. Start Ca/Vit D  Home Health: No Equipment/Devices: PRAFO  Discharge Condition: Improved, still fair  CODE STATUS: DO NOT RESUSCITATE Diet recommendation: Regular  Brief/Interim Summary: This is a 83 yo F with mild dementia, home-dwelling, HTN, AFib and SSS with pacer, not on anticoagulation due to recurrent diverticular hemorrhage, chronic hyponatremia, hypothyoridism and HFpEF who presented with right hip fractue and underwent intramedullary nailing on 10/26 by Dr. Marcelino Scot.  Her post-op course was complicated by worsening of her chronic hyponatremia (125-130 at baseline), weakness and delirium.  Appropriate hemoglobin drop.  She had asymptomatic bacteriuria that was treated with fosfomycin at Ortho's request.  By Dellie Burns, she was able to transfer with PT, had good pain control with oral pain meds, mentation was improved, appetite was good, and sodium was improved, and she was stable for discharge.    Discharge Diagnoses:  Principal Problem:   Closed comminuted fracture of hip, right, initial encounter ([place]) Active Problems:   Chronic atrial fibrillation (HCC)   Hypothyroidism, acquired   Chronic diastolic CHF (congestive heart failure) (HCC)   Sick sinus syndrome [facility])   Essential hypertension    Discharge Instructions  Discharge Instructions    Care order/instruction    Complete by:  As directed    Orthopaedic Trauma Service Discharge Instructions   General Discharge Instructions  WEIGHT BEARING STATUS: Weightbearing as  tolerated right lower extremity   RANGE OF MOTION/ACTIVITY: Unrestricted range of motion right hip, knee and ankle  Patient is to wear PRAFO boot while at rest and in bed.  Do not wear boot while ambulating or working with therapy.  Wound Care: Daily wound care starting on arrival to nursing facility.  Daily dressing changes as needed.  Okay to leave wounds open to air once they are completely dry.  Please see instructions below  Discharge Wound Care Instructions  Do NOT apply any ointments, solutions or lotions to pin sites or surgical wounds.  These prevent needed drainage and even though solutions like hydrogen peroxide kill bacteria, they also damage cells lining the pin sites that help fight infection.  Applying lotions or ointments can keep the wounds moist and can cause them to breakdown and open up as well. This can increase the risk for infection. When in doubt call the office.  Surgical incisions should be dressed daily.  If any drainage is noted, use one layer of adaptic, then gauze, Kerlix, and an ace wrap.  Once the incision is completely dry and without drainage, it may be left open to air out.  Showering may begin 36-48 hours later.  Cleaning gently with soap and water.  Traumatic wounds should be dressed daily as well.    One layer of adaptic, gauze, Kerlix, then ace wrap.  The adaptic can be discontinued once the draining has ceased    If you have a wet to dry dressing: wet the gauze with saline the squeeze as much saline out so the gauze is moist (not soaking wet), place moistened gauze over wound, then place a dry gauze over the moist one, followed by  Kerlix wrap, then ace wrap.   DVT/PE prophylaxis: Lovenox times 28 days  Diet: as you were eating previously.  Can use over the counter stool softeners and bowel preparations, such as Miralax, to help with bowel movements.  Narcotics can be constipating.  Be sure to drink plenty of fluids  PAIN MEDICATION USE AND  EXPECTATIONS  You have likely been given narcotic medications to help control your pain.  After a traumatic event that results in an fracture (broken bone) with or without surgery, it is ok to use narcotic pain medications to help control one's pain.  We understand that everyone responds to pain differently and each individual patient will be evaluated on a regular basis for the continued need for narcotic medications. Ideally, narcotic medication use should last no more than 6-8 weeks (coinciding with fracture healing).   As a patient it is your responsibility as well to monitor narcotic medication use and report the amount and frequency you use these medications when you come to your office visit.   We would also advise that if you are using narcotic medications, you should take a dose prior to therapy to maximize you participation.  IF YOU ARE ON NARCOTIC MEDICATIONS IT IS NOT PERMISSIBLE TO OPERATE A MOTOR VEHICLE (MOTORCYCLE/CAR/TRUCK/MOPED) OR HEAVY MACHINERY DO NOT MIX NARCOTICS WITH OTHER CNS (CENTRAL NERVOUS SYSTEM) DEPRESSANTS SUCH AS ALCOHOL   STOP SMOKING OR USING NICOTINE PRODUCTS!!!!  As discussed nicotine severely impairs your body's ability to heal surgical and traumatic wounds but also impairs bone healing.  Wounds and bone heal by forming microscopic blood vessels (angiogenesis) and nicotine is a vasoconstrictor (essentially, shrinks blood vessels).  Therefore, if vasoconstriction occurs to these microscopic blood vessels they essentially disappear and are unable to deliver necessary nutrients to the healing tissue.  This is one modifiable factor that you can do to dramatically increase your chances of healing your injury.    (This means no smoking, no nicotine gum, patches, etc)  DO NOT USE NONSTEROIDAL ANTI-INFLAMMATORY DRUGS (NSAID'S)  Using products such as Advil (ibuprofen), Aleve (naproxen), Motrin (ibuprofen) for additional pain control during fracture healing can delay and/or  prevent the healing response.  If you would like to take over the counter (OTC) medication, Tylenol (acetaminophen) is ok.  However, some narcotic medications that are given for pain control contain acetaminophen as well. Therefore, you should not exceed more than 4000 mg of tylenol in a day if you do not have liver disease.  Also note that there are may OTC medicines, such as cold medicines and allergy medicines that my contain tylenol as well.  If you have any questions about medications and/or interactions please ask your doctor/PA or your pharmacist.      ICE AND ELEVATE INJURED/OPERATIVE EXTREMITY  Using ice and elevating the injured extremity above your heart can help with swelling and pain control.  Icing in a pulsatile fashion, such as 20 minutes on and 20 minutes off, can be followed.    Do not place ice directly on skin. Make sure there is a barrier between to skin and the ice pack.    Using frozen items such as frozen peas works well as the conform nicely to the are that needs to be iced.  USE AN ACE WRAP OR TED HOSE FOR SWELLING CONTROL  In addition to icing and elevation, Ace wraps or TED hose are used to help limit and resolve swelling.  It is recommended to use Ace wraps or TED hose until you  are informed to stop.    When using Ace Wraps start the wrapping distally (farthest away from the body) and wrap proximally (closer to the body)   Example: If you had surgery on your leg or thing and you do not have a splint on, start the ace wrap at the toes and work your way up to the thigh        If you had surgery on your upper extremity and do not have a splint on, start the ace wrap at your fingers and work your way up to the upper arm  IF YOU ARE IN A SPLINT OR CAST DO NOT [place]   If your splint gets wet for any reason please contact the office immediately. You may shower in your splint or cast as long as you keep it dry.  This can be done by wrapping in a cast cover or  garbage back (or similar)  Do Not stick any thing down your splint or cast such as pencils, money, or hangers to try and scratch yourself with.  If you feel itchy take benadryl as prescribed on the bottle for itching  IF YOU ARE IN A CAM BOOT (BLACK BOOT)  You may remove boot periodically. Perform daily dressing changes as noted below.  Wash the liner of the boot regularly and wear a sock when wearing the boot. It is recommended that you sleep in the boot until told otherwise  CALL THE OFFICE WITH ANY QUESTIONS OR CONCERNS: [phone]   Diet general    Complete by:  As directed    Weight bearing as tolerated    Complete by:  As directed    Laterality:  right   Extremity:  Lower     Allergies as of 09/10/2017      Reactions   Ace Inhibitors Cough   Aspirin Nausea Only   Sulfa Drugs Cross Reactors Nausea Only   Atorvastatin Other (See Comments)   Leg swelling   Welchol [colesevelam Hcl] Hives      Medication List    STOP taking these medications   gabapentin 300 MG capsule Commonly known as:  NEURONTIN   HYDROcodone-acetaminophen 10-325 MG tablet Commonly known as:  NORCO Replaced by:  HYDROcodone-acetaminophen 5-325 MG tablet     TAKE these medications   acetaminophen 325 MG tablet Commonly known as:  TYLENOL Take 1-2 tablets (325-650 mg total) by mouth every 6 (six) hours as needed for mild pain, moderate pain or fever (or Fever >/= 101).   bisacodyl 10 MG suppository Commonly known as:  DULCOLAX Place 1 suppository (10 mg total) rectally daily as needed for moderate constipation.   CALCIUM 600 + D PO Take 1 tablet by mouth daily.   cetirizine 10 MG tablet Commonly known as:  ZYRTEC Take 10 mg by mouth daily as needed for allergies.   diltiazem 120 MG 24 hr capsule Commonly known as:  CARDIZEM CD Take 1 capsule (120 mg total) by mouth daily.   docusate sodium 100 MG capsule Commonly known as:  COLACE Take 1 capsule (100 mg total) by mouth 2 (two) times  daily.   enoxaparin 40 MG/0.4ML injection Commonly known as:  LOVENOX Inject 0.4 mLs (40 mg total) into the skin daily.   furosemide 40 MG tablet Commonly known as:  LASIX TAKE 1 TABLET BY MOUTH QOD AND TAKE 1/2 TABLET ON ALTERNATING DAYS What changed:  how much to take  how to take this  when to take  this  additional instructions   HYDROcodone-acetaminophen 5-325 MG tablet Commonly known as:  NORCO/VICODIN Take 1 tablet by mouth every 4 (four) hours as needed for moderate pain. Replaces:  HYDROcodone-acetaminophen 10-325 MG tablet   levothyroxine 50 MCG tablet Commonly known as:  SYNTHROID, LEVOTHROID Take 50 mcg by mouth daily before breakfast.   losartan 100 MG tablet Commonly known as:  COZAAR Take 100 mg by mouth every morning.   memantine 5 MG tablet Commonly known as:  NAMENDA Take 10 mg by mouth 2 (two) times daily. What changed:  Another medication with the same name was removed. Continue taking this medication, and follow the directions you see here.   metoprolol 200 MG 24 hr tablet Commonly known as:  TOPROL-XL Take 200 mg by mouth every morning.   polyvinyl alcohol 1.4 % ophthalmic solution Commonly known as:  LIQUIFILM TEARS Place 1 drop into both eyes every 4 (four) hours as needed for dry eyes.   pravastatin 40 MG tablet Commonly known as:  PRAVACHOL Take 40 mg by mouth daily.   sodium chloride 0.65 % Soln nasal spray Commonly known as:  OCEAN Place 1 spray into both nostrils every 4 (four) hours as needed for congestion.   Vitamin D (Ergocalciferol) 50000 units Caps capsule Commonly known as:  DRISDOL Take 1 capsule (50,000 Units total) by mouth every 7 (seven) days.            Discharge Care Instructions        Start     Ordered   09/10/17 0000  Weight bearing as tolerated    Question Answer Comment  Laterality right   Extremity Lower      09/10/17 0929      Contact information for follow-up providers    [place], MD.  Schedule an appointment as soon as possible for a visit in 2 week(s).   Specialty:  Orthopedic Surgery Contact information: [place] [place] [address] [phone]            Contact information for after-discharge care    Destination    [facility] Follow up.   Specialty:  Skilled Nursing Facility Contact information: [place] [phone]                 Allergies  Allergen Reactions  . Ace Inhibitors Cough  . Aspirin Nausea Only  . Sulfa Drugs Cross Reactors Nausea Only  . Atorvastatin Other (See Comments)    Leg swelling  . Welchol [Colesevelam Hcl] Hives    Consultations:  Orthopedics   Procedures/Studies: Dg Chest 1 View  Result Date: 09/05/2017 CLINICAL DATA:  Severe right hip pain.  Fall. EXAM: CHEST 1 VIEW COMPARISON:  11/13/2016 FINDINGS: Left pacer remains in place, unchanged. Cardiomegaly. Aortic atherosclerosis. No confluent airspace opacity or effusion. No acute bony abnormality. IMPRESSION: Cardiomegaly.  No active disease. Electronically Signed   By: Rolm Baptise M.D.   On: 09/05/2017 10:12   Dg Pelvis 1-2 Views  Result Date: 09/06/2017 CLINICAL DATA:  Right femur fracture fixation. EXAM: PELVIS - 1-2 VIEW COMPARISON:  Radiographs 09/05/2017 and 09/06/2017. FINDINGS: Status post recent right femoral intramedullary nail and dynamic screw fixation of a comminuted right intertrochanteric fracture. The hardware is well positioned. Previous left hip hemiarthroplasty. No evidence of pelvic fracture or dislocation. There is some gas within the soft tissues around the right hip. IMPRESSION: No demonstrated complication following right femoral intramedullary nail placement. Electronically Signed  By: Richardean Sale M.D.   On: 09/06/2017 12:49   Dg Elbow Complete Right  Result Date: 09/05/2017 CLINICAL DATA:  Right elbow pain since a fall this morning. Initial  encounter. EXAM: RIGHT ELBOW - COMPLETE 3+ VIEW COMPARISON:  None. FINDINGS: No acute bony or joint abnormality is identified. No joint effusion. Soft tissues posterior to the proximal ulna appear swollen compatible with hematoma. IMPRESSION: No acute bony abnormality. Likely hematoma posterior to the proximal ulna. Electronically Signed   By: Inge Rise M.D.   On: 09/05/2017 10:12   Dg Knee 1-2 Views Right  Result Date: 09/05/2017 CLINICAL DATA:  [place] this morning. EXAM: RIGHT KNEE - 1-2 VIEW COMPARISON:  None. FINDINGS: No acute fractures identified. No osteochondral lesion or joint effusion. Mild-to-moderate tricompartmental degenerative changes. IMPRESSION: No acute bony findings. Electronically Signed   By: Marijo Sanes M.D.   On: 09/05/2017 12:49   Ct Hip Right Wo Contrast  Result Date: 09/05/2017 CLINICAL DATA:  Right femur fracture. EXAM: CT OF THE RIGHT HIP WITHOUT CONTRAST TECHNIQUE: Multidetector CT imaging of the right hip was performed according to the standard protocol. Multiplanar CT image reconstructions were also generated. COMPARISON:  Right hip x-rays from same date. CT abdomen pelvis dated November 13, 2016. FINDINGS: Bones/Joint/Cartilage Again seen is a comminuted, impacted, and displaced predominantly intertrochanteric femur fracture with extension into the basicervical femoral neck. The dominant distal fragment is displaced anteriorly by 1 bone shaft width, while the intertrochanteric fragment is displaced posteriorly by 1 bone shaft width. There is apex anterior angulation. No additional fracture is seen. No dislocation. Ligaments Suboptimally assessed by CT. Muscles and Tendons No focal abnormality. Soft tissues Soft tissue stranding over the right greater trochanter. Hematoma surrounding the fracture fragments. The bladder is prominently distended. IMPRESSION: 1. Comminuted, impacted, and displaced intertrochanteric and basicervical proximal femur fracture. Electronically  Signed   By: Titus Dubin M.D.   On: 09/05/2017 12:45   Dg C-arm 61-120 Min  Result Date: 09/06/2017 CLINICAL DATA:  Right femoral internal fixation EXAM: DG C-ARM 61-120 MIN; RIGHT FEMUR 2 VIEWS COMPARISON:  Plain films 09/05/2017 FINDINGS: Multiple intraoperative spot images demonstrate intramedullary nail placement and dynamic hip screw across the right femoral intertrochanteric fracture. Near anatomic alignment. No hardware bony complicating feature. Remote changes of left hip replacement. IMPRESSION: Internal fixation across the right femoral intertrochanteric fracture with anatomic alignment and no visible complicating feature. Electronically Signed   By: Rolm Baptise M.D.   On: 09/06/2017 10:53   Dg Hip Unilat  With Pelvis 2-3 Views Right  Result Date: 09/05/2017 CLINICAL DATA:  Recent trip and fall with severe right hip pain EXAM: DG HIP (WITH OR WITHOUT PELVIS) 2-3V RIGHT COMPARISON:  None. FINDINGS: Left hip replacement is noted. The pelvic ring is intact. A comminuted proximal right femoral fracture is noted involving primarily the intratrochanteric region with some extension into the proximal metaphysis. Impaction at the fracture site is noted. Some posterior angulation of the distal fracture fragment is noted as well. IMPRESSION: Comminuted intratrochanteric fracture with impaction and angulation at the fracture site. Electronically Signed   By: Inez Catalina M.D.   On: 09/05/2017 10:14   Dg Femur, Min 2 Views Right  Result Date: 09/06/2017 CLINICAL DATA:  Right femoral internal fixation EXAM: DG C-ARM 61-120 MIN; RIGHT FEMUR 2 VIEWS COMPARISON:  Plain films 09/05/2017 FINDINGS: Multiple intraoperative spot images demonstrate intramedullary nail placement and dynamic hip screw across the right femoral intertrochanteric fracture. Near anatomic alignment. No hardware bony complicating  feature. Remote changes of left hip replacement. IMPRESSION: Internal fixation across the right femoral  intertrochanteric fracture with anatomic alignment and no visible complicating feature. Electronically Signed   By: Rolm Baptise M.D.   On: 09/06/2017 10:53   Dg Femur Port, Min 2 Views Right  Result Date: 09/06/2017 CLINICAL DATA:  Right femur fracture post ORIF. EXAM: RIGHT FEMUR PORTABLE 2 VIEW COMPARISON:  Intraoperative radiograph same date. Additional radiographs 09/05/2017. FINDINGS: Right femoral intramedullary nail and dynamic screw appear well positioned. There is a distal interlocking screw. There is improved alignment of the intertrochanteric femur fracture. The femoral head is located. There is no evidence of pelvic fracture. There is some gas in the soft tissue surrounding the proximal femur. IMPRESSION: Improved alignment of intertrochanteric right femur fracture status post finding nail fixation. No demonstrated complication. Electronically Signed   By: Richardean Sale M.D.   On: 09/06/2017 12:48      Subjective: Feeling better.  Oriented to Hospital, still thinks this is [facility] [employer].  Oriented to October, Halloween.  Pain in right leg is throughout right leg.  Overall vey weak.  Appetite good.  Discharge Exam: Vitals:   09/09/17 2100 09/10/17 0436  BP: 112/64 130/62  Pulse: 71 78  Resp: 16 16  Temp: 99.1 F (37.3 C) 98.8 F (37.1 C)  SpO2: 96% 97%   Vitals:   09/09/17 0900 09/09/17 1444 09/09/17 2100 09/10/17 0436  BP: (!) 123/46 (!) 124/56 112/64 130/62  Pulse: 72 72 71 78  Resp:  16 16 16   Temp:  98.7 F (37.1 C) 99.1 F (37.3 C) 98.8 F (37.1 C)  TempSrc:  Oral Oral Oral  SpO2:  97% 96% 97%  Weight:      Height:        General: Frail elderly.  Pt is alert, awake, not in acute distress, sitting in chair, just worked with PT Cardiovascular: Regular rate, irregularly irregular, S1/S2 +, no rubs, no gallops Respiratory: CTA bilaterally, no wheezing, no rhonchi Abdominal: Soft, NT, ND, bowel sounds + Extremities: no edema, no cyanosis, the right and left heel  are without redness or skin breakdown; no evidence of cellulitis    The results of significant diagnostics from this hospitalization (including imaging, microbiology, ancillary and laboratory) are listed below for reference.     Microbiology: Recent Results (from the past 240 hour(s))  Culture, Urine     Status: Abnormal   Collection Time: 09/06/17  5:41 PM  Result Value Ref Range Status   Specimen Description URINE, CATHETERIZED  Final   Special Requests NONE  Final   Culture (A)  Final    >=100,000 COLONIES/mL ENTEROCOCCUS FAECALIS >=100,000 COLONIES/mL PSEUDOMONAS AERUGINOSA    Report Status 09/08/2017 FINAL  Final   Organism ID, Bacteria ENTEROCOCCUS FAECALIS (A)  Final   Organism ID, Bacteria PSEUDOMONAS AERUGINOSA (A)  Final      Susceptibility   Enterococcus faecalis - MIC*    AMPICILLIN <=2 SENSITIVE Sensitive     LEVOFLOXACIN 1 SENSITIVE Sensitive     NITROFURANTOIN <=16 SENSITIVE Sensitive     VANCOMYCIN <=0.5 SENSITIVE Sensitive     * >=100,000 COLONIES/mL ENTEROCOCCUS FAECALIS   Pseudomonas aeruginosa - MIC*    CEFTAZIDIME 4 SENSITIVE Sensitive     CIPROFLOXACIN <=0.25 SENSITIVE Sensitive     GENTAMICIN <=1 SENSITIVE Sensitive     IMIPENEM <=0.25 SENSITIVE Sensitive     PIP/TAZO <=4 SENSITIVE Sensitive     CEFEPIME <=1 SENSITIVE Sensitive     * >=100,000 COLONIES/mL  PSEUDOMONAS AERUGINOSA  Culture, Urine     Status: Abnormal   Collection Time: 09/08/17  2:07 PM  Result Value Ref Range Status   Specimen Description URINE, RANDOM  Final   Special Requests Normal  Final   Culture >=100,000 COLONIES/mL PSEUDOMONAS AERUGINOSA (A)  Final   Report Status 09/10/2017 FINAL  Final   Organism ID, Bacteria PSEUDOMONAS AERUGINOSA (A)  Final      Susceptibility   Pseudomonas aeruginosa - MIC*    CEFTAZIDIME 2 SENSITIVE Sensitive     CIPROFLOXACIN <=0.25 SENSITIVE Sensitive     GENTAMICIN <=1 SENSITIVE Sensitive     IMIPENEM <=0.25 SENSITIVE Sensitive     PIP/TAZO <=4  SENSITIVE Sensitive     CEFEPIME <=1 SENSITIVE Sensitive     * >=100,000 COLONIES/mL PSEUDOMONAS AERUGINOSA     Labs: BNP (last 3 results)  Recent Labs  11/13/16 2258  BNP 778.2*   Basic Metabolic Panel:  Recent Labs Lab 09/05/17 1007 09/06/17 1133 09/07/17 0640 09/08/17 0254 09/09/17 0339 09/10/17 0758  NA 130*  --   --  127* 126* 130*  K 3.4*  --   --  3.8 4.6 4.7  CL 89*  --   --  89* 88* 91*  CO2 28  --   --  30 32 31  GLUCOSE 206*  --   --  110* 114* 126*  BUN 8  --   --  8 16 19   CREATININE 0.58 0.90  --  0.61 0.71 0.73  CALCIUM 9.1  --  8.7 8.8* 8.6* 8.9  MG  --   --  1.5*  --   --   --   PHOS  --   --  2.6  --   --   --    Liver Function Tests: No results for input(s): AST, ALT, ALKPHOS, BILITOT, PROT, ALBUMIN in the last 168 hours. No results for input(s): LIPASE, AMYLASE in the last 168 hours. No results for input(s): AMMONIA in the last 168 hours. CBC:  Recent Labs Lab 09/05/17 1007 09/06/17 1133 09/08/17 0254 09/09/17 0339  WBC 17.0* 13.5* 9.0 9.3  NEUTROABS 15.2*  --   --   --   HGB 13.0 12.3 10.0* 9.7*  HCT 38.0 35.5* 30.4* 29.5*  MCV 94.5 93.7 95.3 95.8  PLT 269 206 197 227   Cardiac Enzymes: No results for input(s): CKTOTAL, CKMB, CKMBINDEX, TROPONINI in the last 168 hours. BNP: Invalid input(s): POCBNP CBG:  Recent Labs Lab 09/09/17 0556 09/09/17 1140 09/09/17 1638 09/09/17 2122 09/10/17 0613  GLUCAP 119* 164* 137* 129* 137*   D-Dimer No results for input(s): DDIMER in the last 72 hours. Hgb A1c No results for input(s): HGBA1C in the last 72 hours. Lipid Profile No results for input(s): CHOL, HDL, LDLCALC, TRIG, CHOLHDL, LDLDIRECT in the last 72 hours. Thyroid function studies No results for input(s): TSH, T4TOTAL, T3FREE, THYROIDAB in the last 72 hours.  Invalid input(s): FREET3 Anemia work up No results for input(s): VITAMINB12, FOLATE, FERRITIN, TIBC, IRON, RETICCTPCT in the last 72 hours. Urinalysis    Component  Value Date/Time   COLORURINE YELLOW 09/05/2017 1201   APPEARANCEUR CLOUDY (A) 09/05/2017 1201   LABSPEC 1.006 09/05/2017 1201   PHURINE 8.0 09/05/2017 1201   GLUCOSEU >=500 (A) 09/05/2017 1201   HGBUR SMALL (A) 09/05/2017 1201   BILIRUBINUR NEGATIVE 09/05/2017 1201   KETONESUR 5 (A) 09/05/2017 1201   PROTEINUR 100 (A) 09/05/2017 1201   UROBILINOGEN 0.2 08/21/2013 1615   NITRITE NEGATIVE  09/05/2017 1201   LEUKOCYTESUR TRACE (A) 09/05/2017 1201   Sepsis Labs Invalid input(s): PROCALCITONIN,  WBC,  LACTICIDVEN Microbiology Recent Results (from the past 240 hour(s))  Culture, Urine     Status: Abnormal   Collection Time: 09/06/17  5:41 PM  Result Value Ref Range Status   Specimen Description URINE, CATHETERIZED  Final   Special Requests NONE  Final   Culture (A)  Final    >=100,000 COLONIES/mL ENTEROCOCCUS FAECALIS >=100,000 COLONIES/mL PSEUDOMONAS AERUGINOSA    Report Status 09/08/2017 FINAL  Final   Organism ID, Bacteria ENTEROCOCCUS FAECALIS (A)  Final   Organism ID, Bacteria PSEUDOMONAS AERUGINOSA (A)  Final      Susceptibility   Enterococcus faecalis - MIC*    AMPICILLIN <=2 SENSITIVE Sensitive     LEVOFLOXACIN 1 SENSITIVE Sensitive     NITROFURANTOIN <=16 SENSITIVE Sensitive     VANCOMYCIN <=0.5 SENSITIVE Sensitive     * >=100,000 COLONIES/mL ENTEROCOCCUS FAECALIS   Pseudomonas aeruginosa - MIC*    CEFTAZIDIME 4 SENSITIVE Sensitive     CIPROFLOXACIN <=0.25 SENSITIVE Sensitive     GENTAMICIN <=1 SENSITIVE Sensitive     IMIPENEM <=0.25 SENSITIVE Sensitive     PIP/TAZO <=4 SENSITIVE Sensitive     CEFEPIME <=1 SENSITIVE Sensitive     * >=100,000 COLONIES/mL PSEUDOMONAS AERUGINOSA  Culture, Urine     Status: Abnormal   Collection Time: 09/08/17  2:07 PM  Result Value Ref Range Status   Specimen Description URINE, RANDOM  Final   Special Requests Normal  Final   Culture >=100,000 COLONIES/mL PSEUDOMONAS AERUGINOSA (A)  Final   Report Status 09/10/2017 FINAL  Final    Organism ID, Bacteria PSEUDOMONAS AERUGINOSA (A)  Final      Susceptibility   Pseudomonas aeruginosa - MIC*    CEFTAZIDIME 2 SENSITIVE Sensitive     CIPROFLOXACIN <=0.25 SENSITIVE Sensitive     GENTAMICIN <=1 SENSITIVE Sensitive     IMIPENEM <=0.25 SENSITIVE Sensitive     PIP/TAZO <=4 SENSITIVE Sensitive     CEFEPIME <=1 SENSITIVE Sensitive     * >=100,000 COLONIES/mL PSEUDOMONAS AERUGINOSA     Time coordinating discharge: Over 30 minutes  SIGNED:   Edwin Dada, MD  [facility] 09/10/2017, 10:18 AM   If 7PM-7AM, please contact night-coverage [email or web address] Password [email or web address]

## 2017-09-10 NOTE — Progress Notes (Signed)
Orthopedic Trauma Service Progress Note   Patient ID: [name] MRN: [id number] DOB/AGE: 05-23-1926 83 y.o.  Subjective:  Doing much better this morning More awake and alert Tolerating diet this morning Complains of some right foot soreness and stiffness.  States that her right leg feels heavy Working with therapies   ROS As above  Objective:   VITALS:   Vitals:   09/09/17 0900 09/09/17 1444 09/09/17 2100 09/10/17 0436  BP: (!) 123/46 (!) 124/56 112/64 130/62  Pulse: 72 72 71 78  Resp:  16 16 16   Temp:  98.7 F (37.1 C) 99.1 F (37.3 C) 98.8 F (37.1 C)  TempSrc:  Oral Oral Oral  SpO2:  97% 96% 97%  Weight:      Height:        Estimated body mass index is 22.85 kg/m as calculated from the following:   Height as of this encounter: 5' (1.524 m).   Weight as of this encounter: 53.1 kg (117 lb).   Intake/Output      10/29 0701 - 10/30 0700 10/30 0701 - 10/31 0700   P.O. 50    Other 0    Total Intake(mL/kg) 50 (0.9)    Urine (mL/kg/hr) 400 (0.3)    Total Output 400     Net -350            LABS  Results for orders placed or performed during the hospital encounter of 09/05/17 (from the past 24 hour(s))  Glucose, capillary     Status: Abnormal   Collection Time: 09/09/17 11:40 AM  Result Value Ref Range   Glucose-Capillary 164 (H) 65 - 99 mg/dL  Glucose, capillary     Status: Abnormal   Collection Time: 09/09/17  4:38 PM  Result Value Ref Range   Glucose-Capillary 137 (H) 65 - 99 mg/dL  Glucose, capillary     Status: Abnormal   Collection Time: 09/09/17  9:22 PM  Result Value Ref Range   Glucose-Capillary 129 (H) 65 - 99 mg/dL  Glucose, capillary     Status: Abnormal   Collection Time: 09/10/17  6:13 AM  Result Value Ref Range   Glucose-Capillary 137 (H) 65 - 99 mg/dL  Basic metabolic panel     Status: Abnormal   Collection Time: 09/10/17  7:58 AM  Result Value Ref Range   Sodium 130 (L) 135 - 145 mmol/L   Potassium 4.7  3.5 - 5.1 mmol/L   Chloride 91 (L) 101 - 111 mmol/L   CO2 31 22 - 32 mmol/L   Glucose, Bld 126 (H) 65 - 99 mg/dL   BUN 19 6 - 20 mg/dL   Creatinine, Ser 0.73 0.44 - 1.00 mg/dL   Calcium 8.9 8.9 - 10.3 mg/dL   GFR calc non Af Amer >60 >60 mL/min   GFR calc Af Amer >60 >60 mL/min   Anion gap 8 5 - 15     PHYSICAL EXAM:   Gen: Resting comfortably in bed, no acute distress, appears well. more alert this morning Lungs: Clear anterior fields Cardiac: Irregularly irregular Abd:+ Bowel sounds, nontender, nondistended Ext:       Right lower extremity    Dressings c/d/i             Ext warm             Distal motor and sensory functions are intact  Patient does allow me to passively extend her ankle with her knee and flexion.  Ankle goes beyond neutral  DP pulse+    No significant swelling distally  Skin changes consistent with PVD noted bilaterally  Patient's skin is quite thin along her lower leg.  Very little subq tissue  Assessment/Plan: 4 Days Post-Op   Principal Problem:   Closed comminuted fracture of hip, right, initial encounter [facility]) Active Problems:   Chronic atrial fibrillation (HCC)   Hypothyroidism, acquired   Chronic diastolic CHF (congestive heart failure) (HCC)   Sick sinus syndrome ([place])   Essential hypertension   Anti-infectives    Start     Dose/Rate Route Frequency Ordered Stop   09/09/17 0930  ciprofloxacin (CIPRO) tablet 250 mg  Status:  Discontinued     250 mg Oral 2 times daily 09/09/17 0908 09/09/17 1320   09/06/17 1500  ceFAZolin (ANCEF) IVPB 2g/100 mL premix     2 g 200 mL/hr over 30 Minutes Intravenous Every 6 hours 09/06/17 1120 09/06/17 2157   09/06/17 0811  ceFAZolin (ANCEF) 2-4 GM/100ML-% IVPB    Comments:  Vania Rea   : cabinet override      09/06/17 0811 09/06/17 2014    .  POD/HD#: 38  83 year old female ground-level fall with highly comminuted right peritrochanteric hip fracture   -R peritrochanteric hip fracture s/p IMN              WBAT              ROM as tolerated             Dressing changes as needed  Okay to shower and clean wounds with soap and water   Please do not apply any lotions or solutions such as Betadine or hydrogen peroxide to wounds as this not only kills bacteria but also new healthy cells              PT/OT   Will order PRAFO boot for right ankle.  I am concerned that she could develop a ankle contracture due to referred pain in her right leg.  Patient to wear PRAFO when at rest or in bed.  Does not need to wear or should not wear when ambulating   Patient uses walker and cane at baseline.  Sounds as if she should be using her walker more than anything else.   - mental status changes             much improved  Continue with minimization of narcotics  Hyponatremia is improving   - Pain management:            Norco 03/14/2024 1 p.o. every 4 hours as needed   - ABL anemia/Hemodynamics             cbc looks good             As expected for injury and surgery    - Medical issues              Per medical service               Diabetes                                   A1c in pre-diabetes range                         Will need follow up with PCP  Metabolic bone workup             Patient has evidence of osteoporosis given numerous vertebral compression fractures as well as this low energy hip fracture             PTH looks good as does TSH   25 OH vitamin D is satisfactory at 30.4 ng/mL   Would definitely recommend trying to get the patient above 40 ng/mL as this is been shown to improve muscular function and decrease fall risk.   Continue with patient's home calcium and vitamin D supplementation.   Also add vitamin D2 50,000 IUs weekly for the next 8 weeks.  Recheck labs at that time   Calcitriol levels look appropriate as well.  Between calcitriol and PTH patient looks like she is getting adequate calcium               Recommend bone density scan as an outpatient.  We can  discuss with family as well   - DVT/PE prophylaxis:             Lovenox  for 4 weeks   - ID:             Asymptomatic bacteriuria in a elderly post surgical patient with implanted orthopedic device : fosfomycin given x1   - Metabolic Bone Disease:             As above    - Dispo:            Okay to discharge to skilled nursing center from orthopedic standpoint  Follow-up with orthopedics in 10-14 days  Please see discharge instructions for detailed wound care instructions as well as weightbearing restrictions   Jari Pigg, PA-C [facility] [phone] (P) [phone] (O) [phone] (C) 09/10/2017, 9:09 AM

## 2017-09-10 NOTE — Progress Notes (Signed)
Physical Therapy Treatment Patient Details Name: [name] MRN: [id number] DOB: August 19, 1926 Today's Date: 09/10/2017    History of Present Illness 83 y.o. female with past medical history of atrial fibrillation, CKD, diabetes, heart failure, Nissen fundoplication surgery (1660?) admitted after fall, sustaining right hip fx. IM nailing 09/06/17.     PT Comments    Session focused on improving bed mobility and transfers. Pt able to mobilize slightly more but still max A x2 at this time.  Max A x2 for sit<>stand and stand pivot on LLE into bedside chair. Cued patient throughout session and able to demonstrate increased participation  throughout transfers. Pt has not yet returned to baseline level of independence, and will benefit greatly from continued therapy at next venue of care. PT to follow to adderess goals until medically cleared for d/c.    Follow Up Recommendations  SNF     Equipment Recommendations  None recommended by PT    Recommendations for Other Services       Precautions / Restrictions Precautions Precautions: Fall Restrictions Weight Bearing Restrictions: Yes RLE Weight Bearing: Weight bearing as tolerated    Mobility  Bed Mobility Overal bed mobility: Needs Assistance Bed Mobility: Supine to Sit     Supine to sit: Max assist;+2 for physical assistance;+2 for safety/equipment     General bed mobility comments: max A x2 to sccot to EOB. Pt improved particpiation in bed mobility today. able to support herself without UE support at EOB for 2 minutes in flexed posture.   Transfers Overall transfer level: Needs assistance Equipment used: 2 person hand held assist Transfers: Sit to/from [facility] Sit to Stand: +2 physical assistance;Max assist Stand pivot transfers: +2 physical assistance;Max assist       General transfer comment: sit to stand 1x with max A x2. Stand pivot on L leg into bedside chair.   Ambulation/Gait                  Stairs            Wheelchair Mobility    Modified Rankin (Stroke Patients Only)       Balance Overall balance assessment: Needs assistance Sitting-balance support: Bilateral upper extremity supported Sitting balance-Leahy Scale: Poor Sitting balance - Comments: 1 UE support on bed rail to remain sitting   Standing balance support: Bilateral upper extremity supported Standing balance-Leahy Scale: Zero Standing balance comment: unable to come to full standing position                             Cognition Arousal/Alertness: Lethargic Behavior During Therapy: WFL for tasks assessed/performed Overall Cognitive Status: Impaired/Different from baseline                   Orientation Level: Disoriented to;Time     Following Commands: Follows one step commands consistently       General Comments: can answer most questions and follow commands.       Exercises      General Comments General comments (skin integrity, edema, etc.): Unable to attempt ambulation today. Pt able to scoot herself back into chair without assistance today.       Pertinent Vitals/Pain Pain Assessment: Faces Faces Pain Scale: Hurts whole lot Pain Location: R Hip Pain Intervention(s): Limited activity within patient's tolerance;Repositioned    Home Living  Prior Function            PT Goals (current goals can now be found in the care plan section) Acute Rehab PT Goals Patient Stated Goal: to have less pain PT Goal Formulation: With patient Time For Goal Achievement: 09/14/17 Potential to Achieve Goals: Fair Progress towards PT goals: Progressing toward goals    Frequency    Min 5X/week      PT Plan Current plan remains appropriate    Co-evaluation              AM-PAC PT "6 Clicks" Daily Activity  Outcome Measure  Difficulty turning over in bed (including adjusting bedclothes, sheets and blankets)?:  Unable Difficulty moving from lying on back to sitting on the side of the bed? : Unable Difficulty sitting down on and standing up from a chair with arms (e.g., wheelchair, bedside commode, etc,.)?: Unable Help needed moving to and from a bed to chair (including a wheelchair)?: A Lot Help needed walking in hospital room?: Total Help needed climbing 3-5 steps with a railing? : Total 6 Click Score: 7    End of Session Equipment Utilized During Treatment: Gait belt Activity Tolerance: Patient limited by fatigue;Patient limited by pain Patient left: in chair;with call bell/phone within reach;with chair alarm set;with family/visitor present Nurse Communication: Mobility status PT Visit Diagnosis: Unsteadiness on feet (R26.81);Pain;Muscle weakness (generalized) (M62.81);History of falling (Z91.81) Pain - Right/Left: Right Pain - part of body: Hip     Time: 0910-0930 PT Time Calculation (min) (ACUTE ONLY): 20 min  Charges:  $Therapeutic Activity: 8-22 mins                    G Codes:       Reinaldo Berber, PT, DPT Acute Rehab Services Pager: [phone]     Reinaldo Berber 09/10/2017, 9:41 AM

## 2017-09-10 NOTE — Progress Notes (Signed)
Removed IV, all questions and concerns addressed, Pt not in distress, called facility and gave report to nurse Elmyra Ricks, discharged to facility via [facility] with all Pt belongings.

## 2017-11-12 DEATH — deceased

## 2018-05-30 ENCOUNTER — Encounter: Payer: Self-pay | Admitting: Cardiology

## 2018-10-31 IMAGING — CR DG CHEST 2V
2 series · 2 of 2 positions shown · non-contrast
Comparison: Chest x-rays dated 06/04/2014 and 08/21/2013.

CLINICAL DATA: Fall 2 weeks ago, mid lower back pain for 2 days.
Atrial fibrillation, diabetes, GERD, hypertension, former smoker.

EXAM:
CHEST  2 VIEW

[w chest lat]
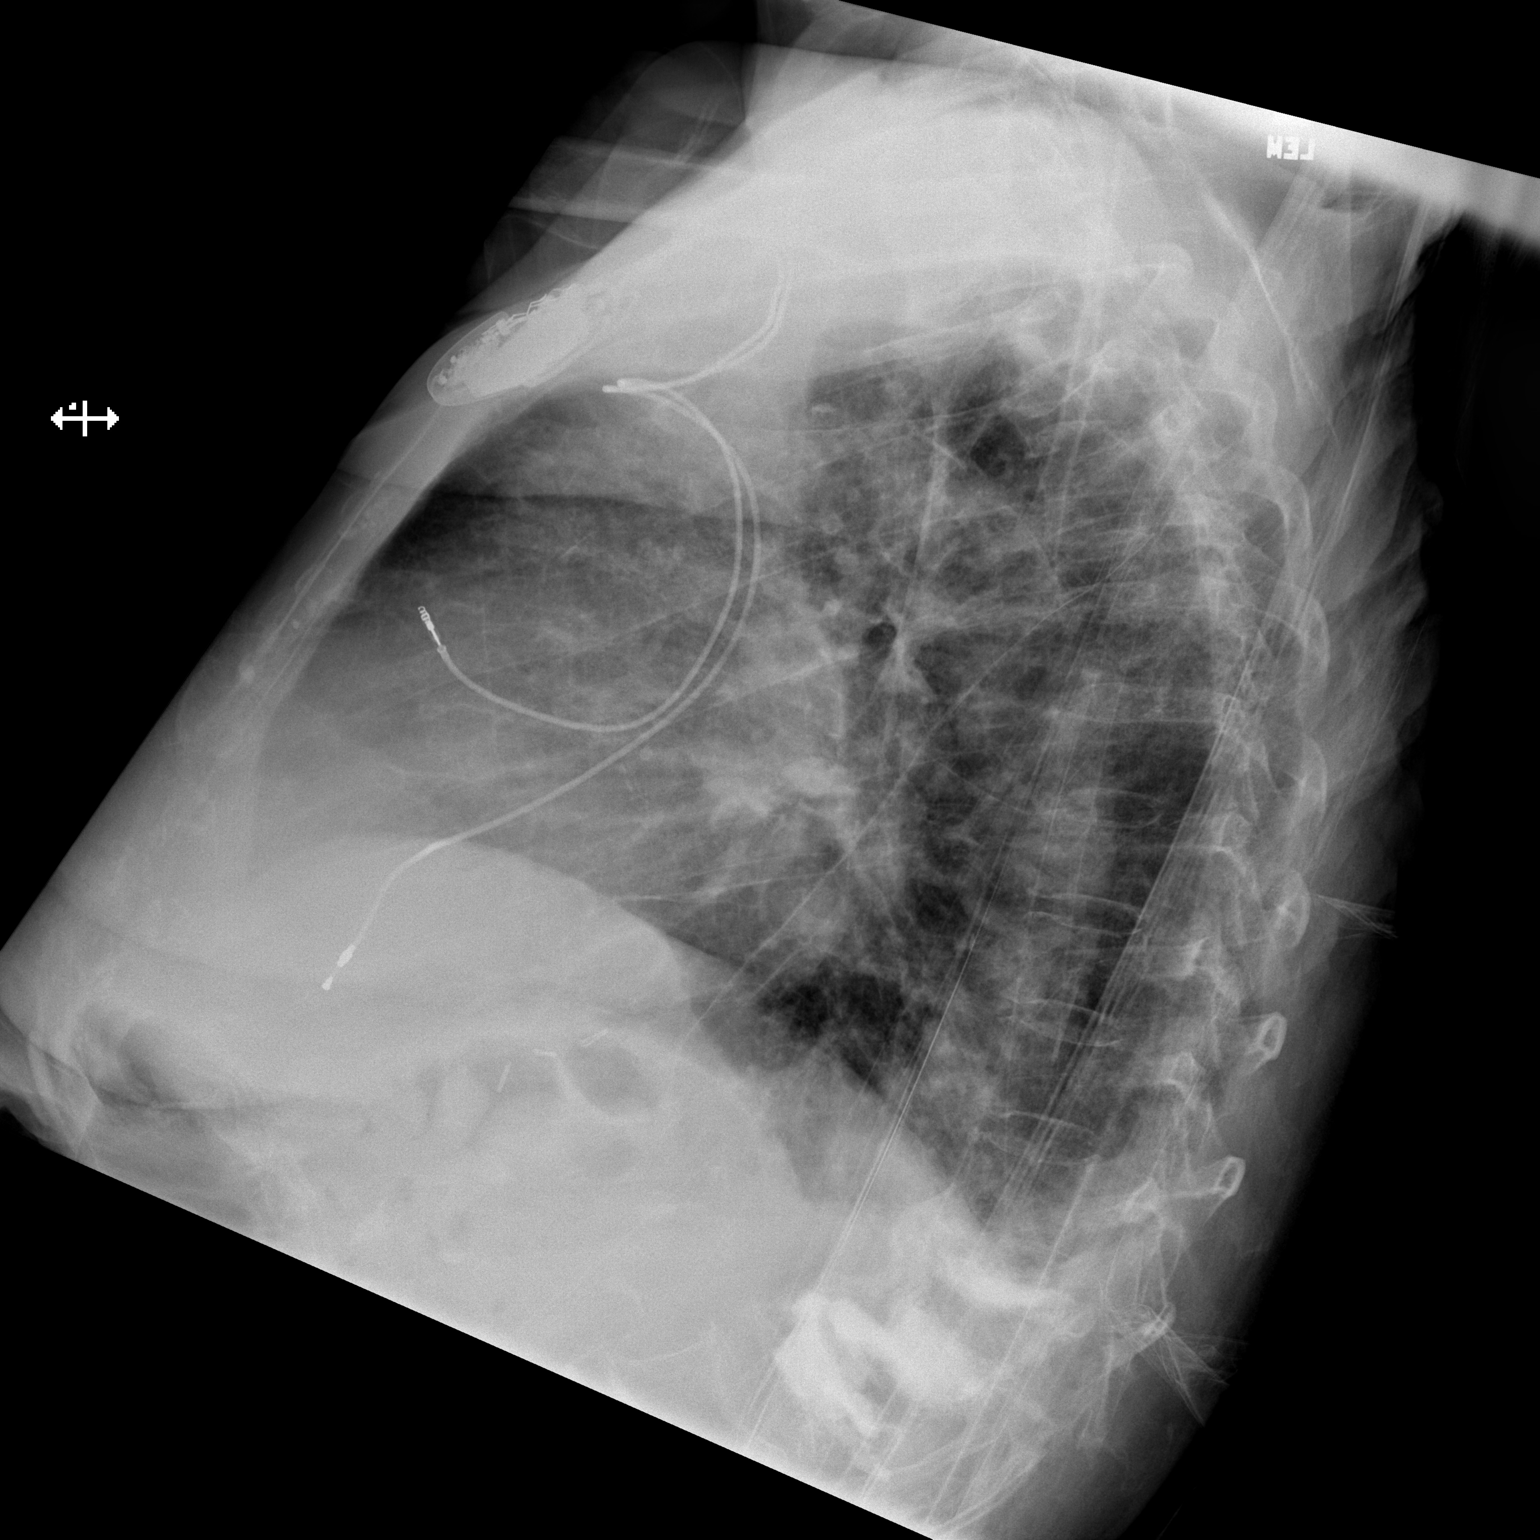

[x chest ap]
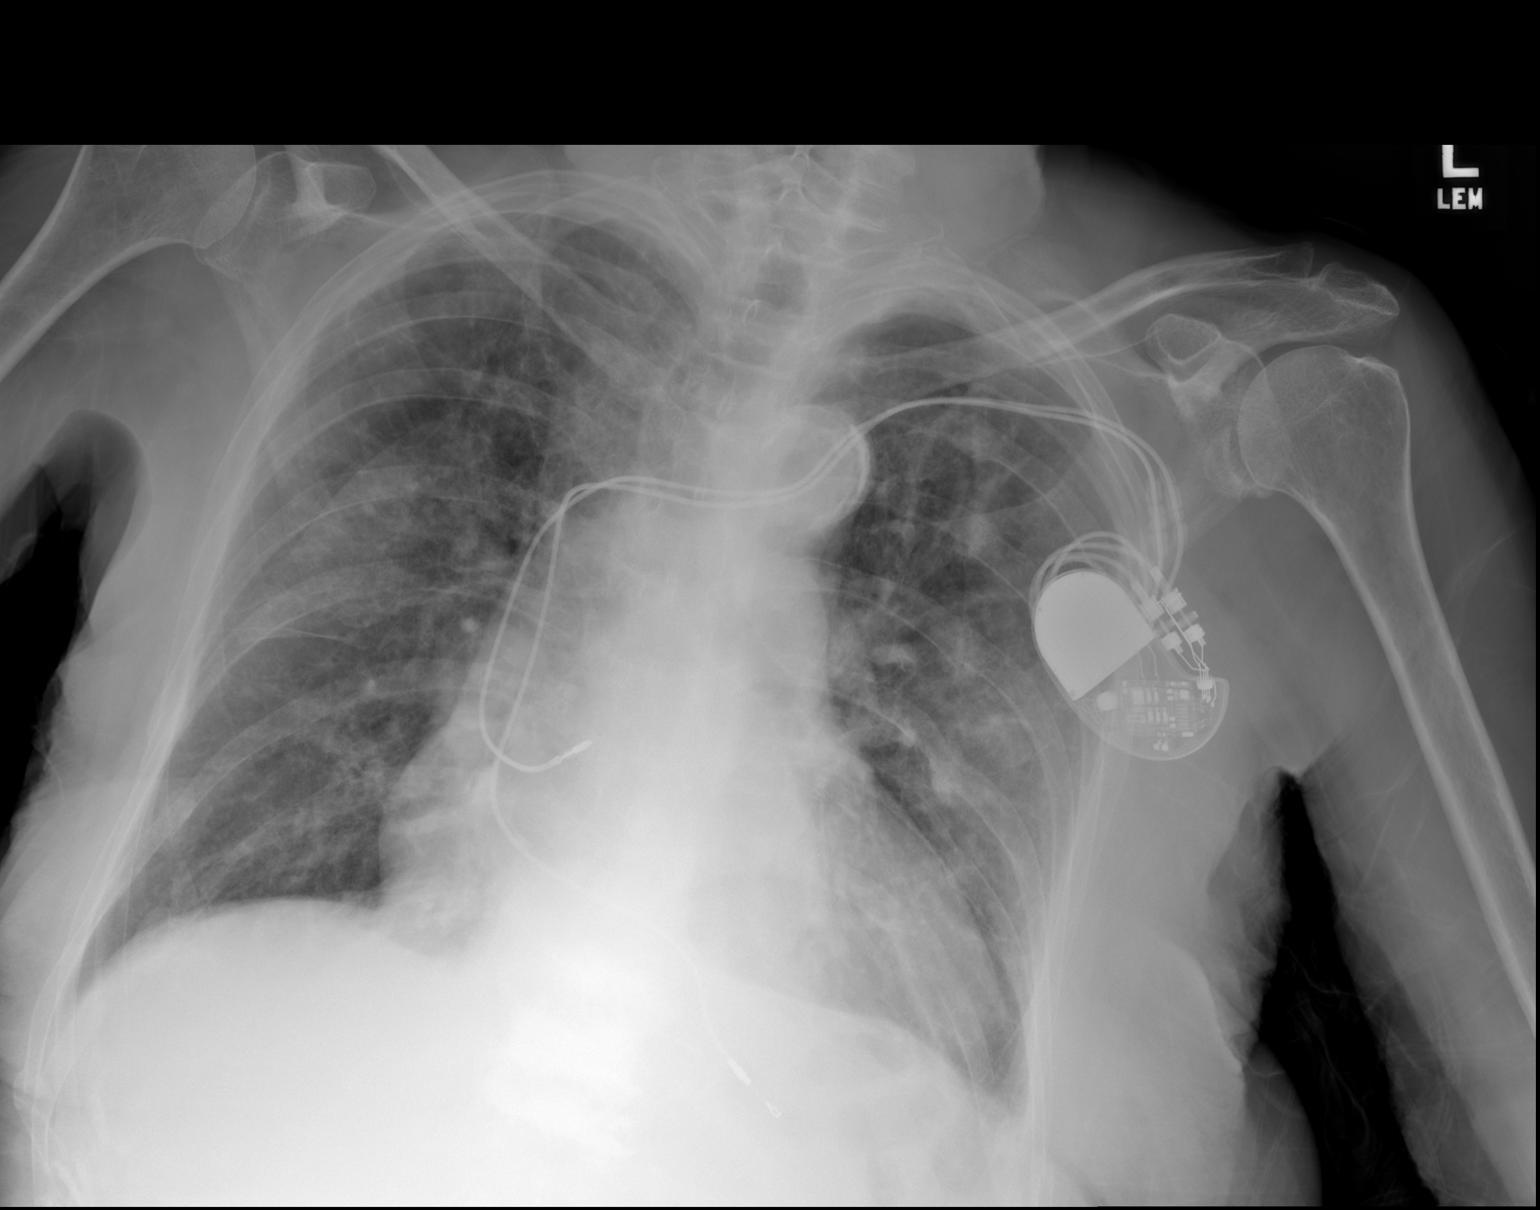

[2 of 2 positions shown; findings below may reference images not displayed]

FINDINGS: There is mild cardiomegaly, stable. Atherosclerotic changes noted at
the aortic arch. Left chest wall pacemaker in place with grossly
stable positioning of the leads.

Subtle opacities within each lung, most likely mild edema. No
confluent opacity to suggest developing pneumonia. Probable small
left pleural effusion. No pneumothorax seen.

There are healing fractures of the left posterior fifth through
eighth ribs. Osteopenia limits characterization of the thoracic
spine but no obvious evidence of acute fracture or dislocation is
seen in the thoracic spine. Patient is status post vertebroplasties
at the thoracolumbar junction.
IMPRESSION: 1. Healing fractures of the posterior left fifth through eighth
ribs, likely subacute in age.
2. Cardiomegaly with mild interstitial edema suggesting mild volume
overload/CHF. No evidence of pneumonia.
3. Probable small left pleural effusion.
4. Aortic atherosclerosis.

## 2018-10-31 IMAGING — CR DG LUMBAR SPINE COMPLETE 4+V
5 series · 5 of 5 positions shown · non-contrast
Comparison: CT abdomen dated 06/30/2015

CLINICAL DATA: Mid lower back pain x 2 days. Per notes-pt fell 2
weeks ago A-fib, DM, GERD, HTN, osteoarthritis, former smoker x 38
years ago Sx: kyphosis-[id number], pacemaker-03/29/2008

EXAM:
LUMBAR SPINE - COMPLETE 4+ VIEW

[t lumbar spine ap]
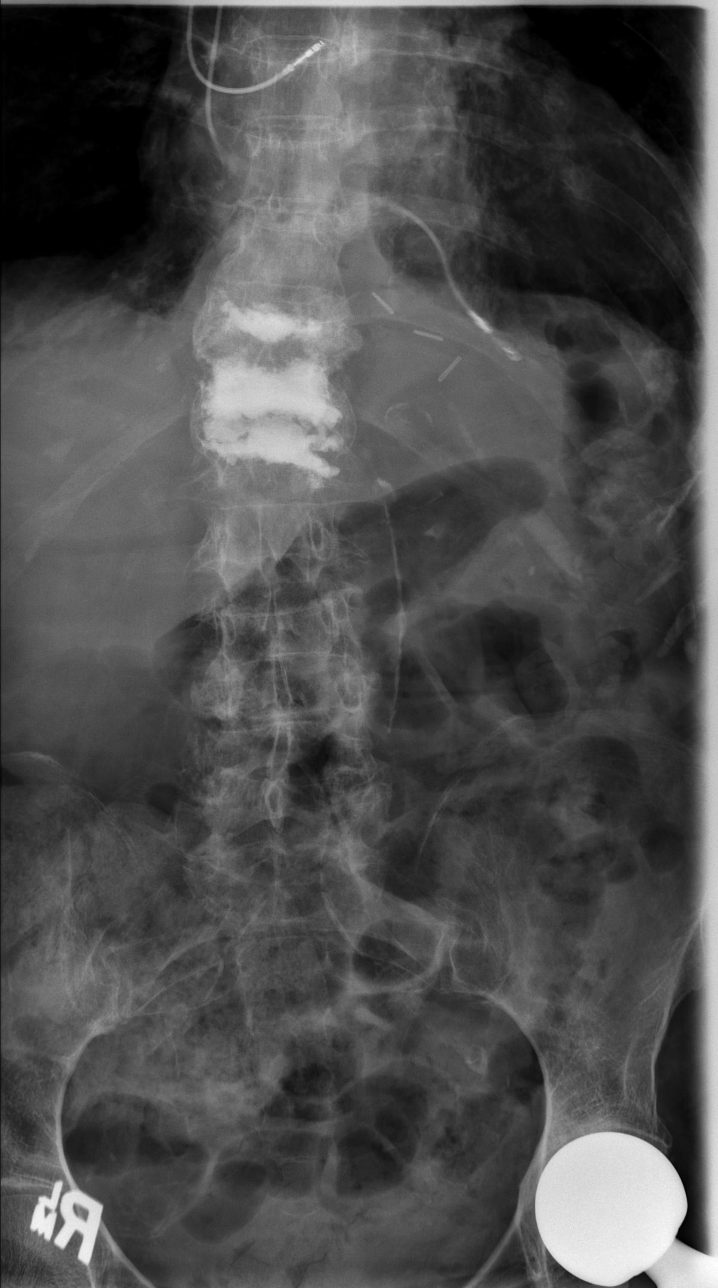

[t lumbar spine obl (1 of 2)]
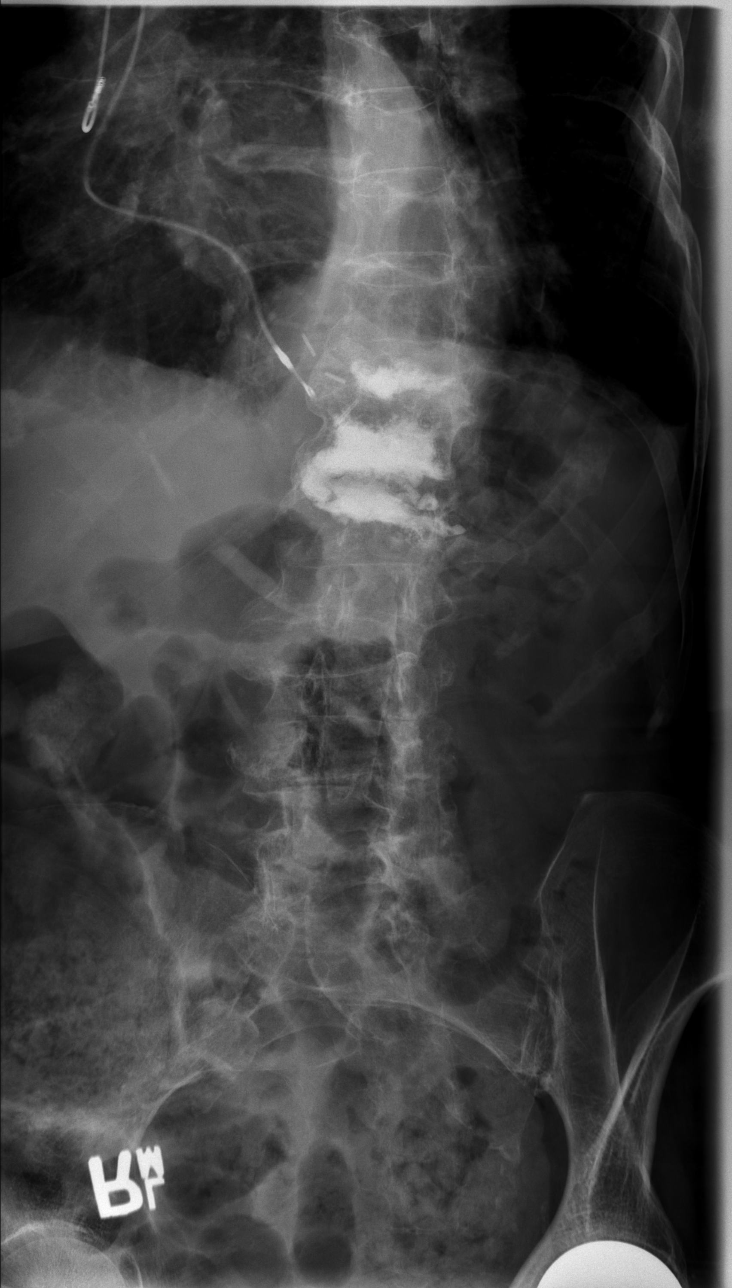

[t lumbar spine obl (2 of 2)]
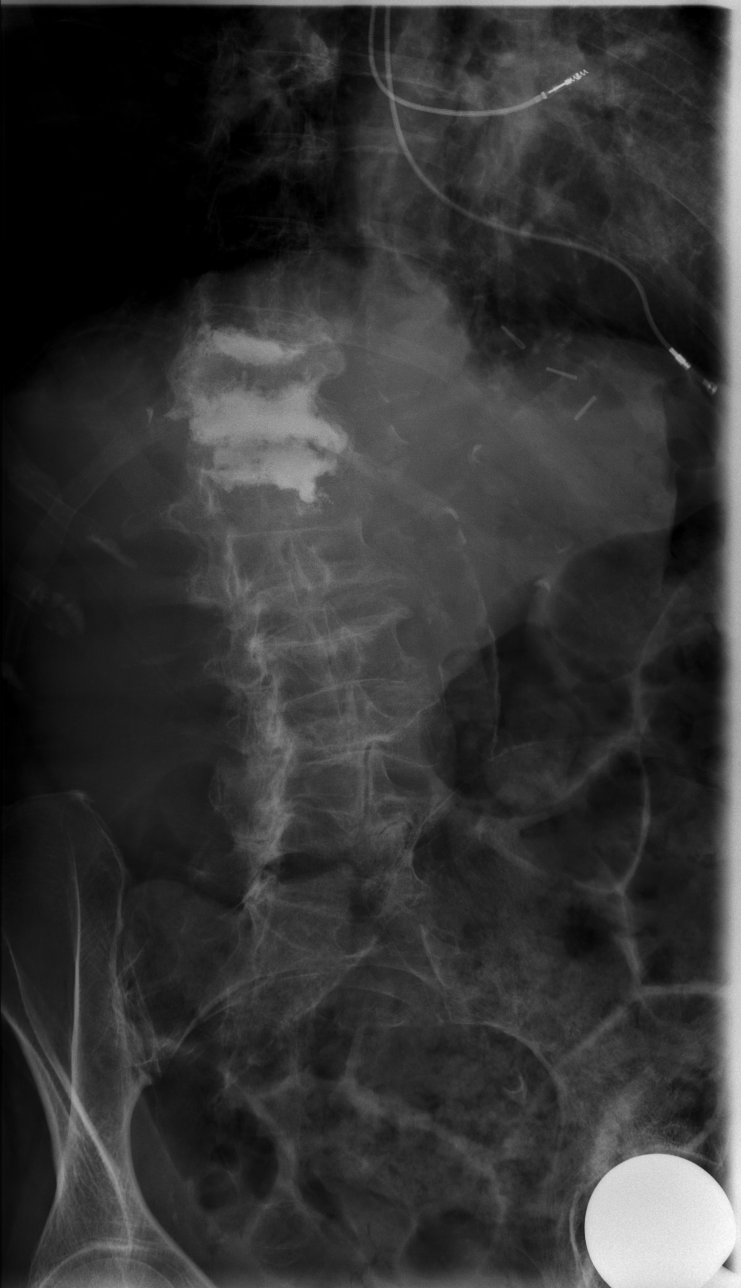

[t lumbar spine lat]
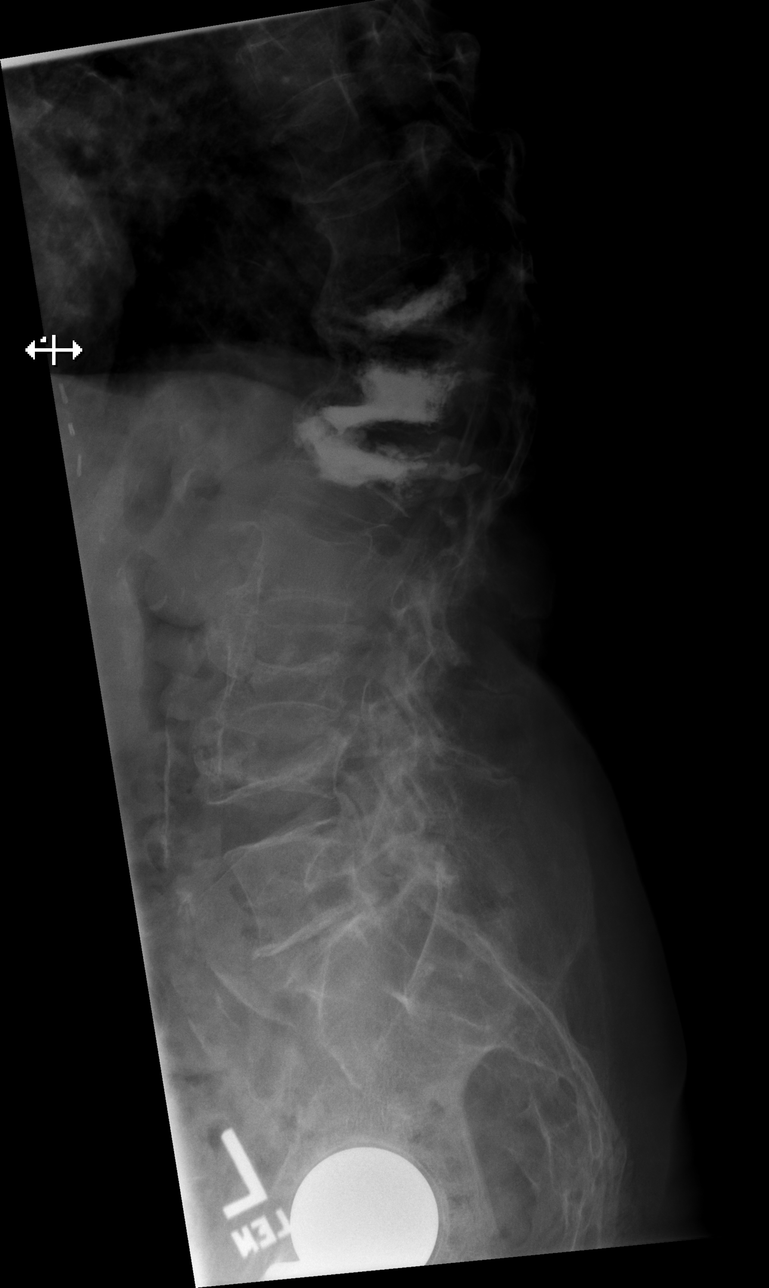

[t lumbar l-5 s-1 spot]
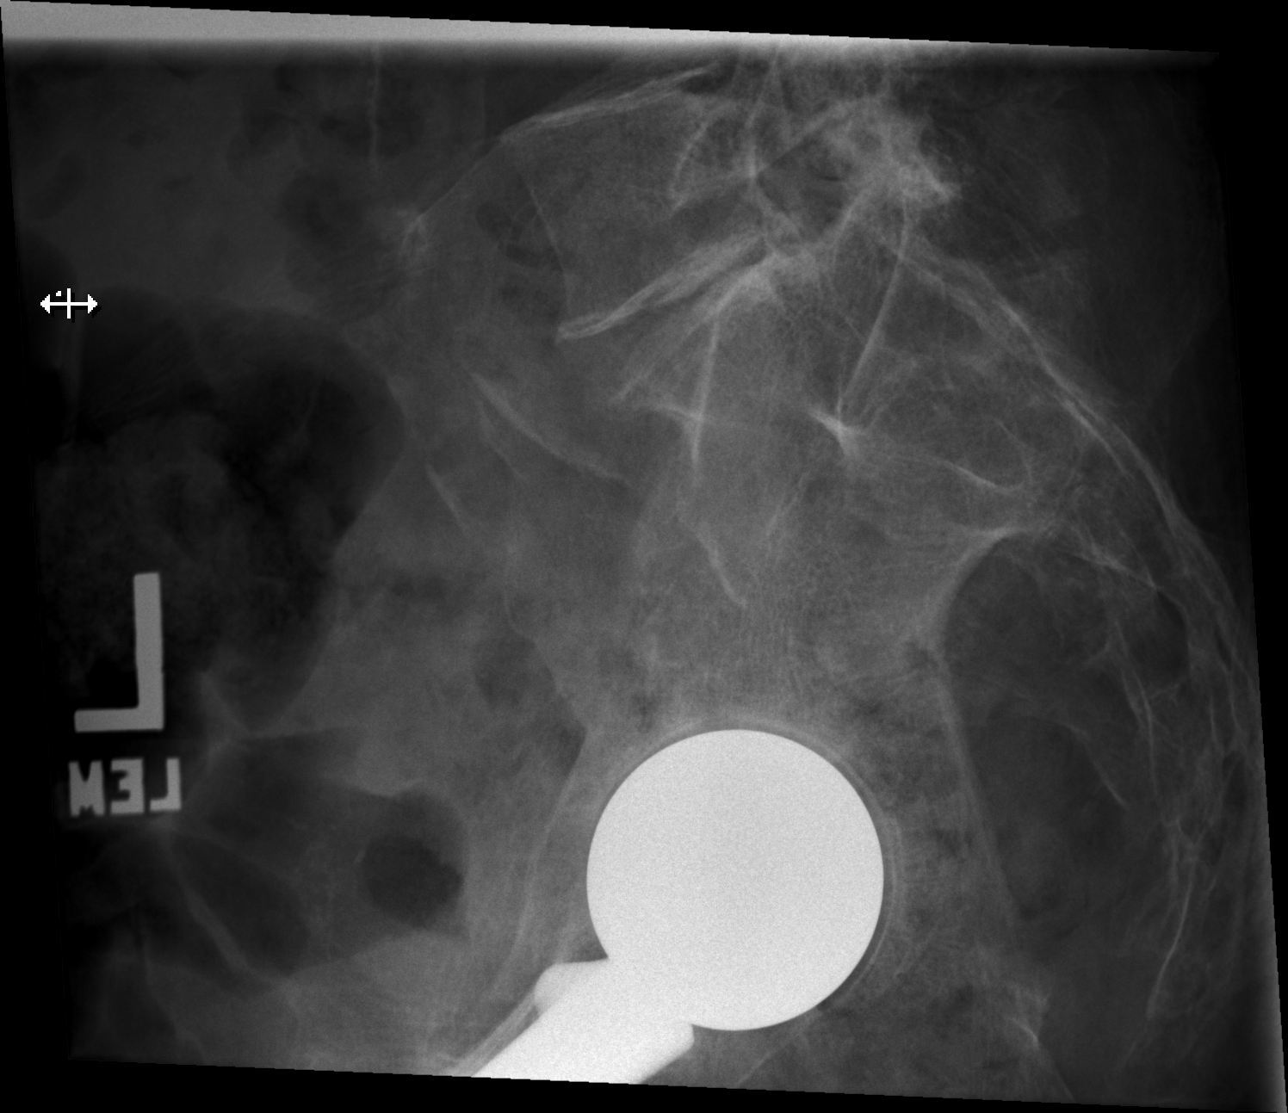

[5 of 5 positions shown; findings below may reference images not displayed]

FINDINGS: There is stable scoliosis of the thoracolumbar spine. Again noted
are changes of vertebroplasties at the thoracolumbar junction.

There is a stable severe compression fracture deformity at the L4
vertebral body. There is an additional compression fracture of the
L3 vertebral body, moderate in degree, also chronic in appearance,
perhaps slightly worsened compared to the earlier CT.

No acute appearing osseous abnormality identified. Atherosclerotic
changes noted along the walls of the infrarenal abdominal aorta.
Visualized paravertebral soft tissues are otherwise unremarkable.
IMPRESSION: 1. No acute findings. Stable severe compression fracture deformity
of the L4 vertebral body. Additional compression fracture deformity
of the L3 vertebral body, moderate in degree, also chronic in
appearance although perhaps slightly worsened in degree compared to
the earlier CT of 06/30/2015.
[DATE]. No acute findings seen.
3. Aortic atherosclerosis.
